# Patient Record
Sex: Male | Born: 1935 | Race: White | Hispanic: No | Marital: Married | Smoking: Former smoker
Health system: Southern US, Community
[De-identification: ages and names within clinical notes are randomized; demographics above are authoritative.]

## PROBLEM LIST (undated history)

## (undated) DIAGNOSIS — F0390 Unspecified dementia without behavioral disturbance: Secondary | ICD-10-CM

## (undated) DIAGNOSIS — I2510 Atherosclerotic heart disease of native coronary artery without angina pectoris: Secondary | ICD-10-CM

## (undated) DIAGNOSIS — R112 Nausea with vomiting, unspecified: Secondary | ICD-10-CM

## (undated) DIAGNOSIS — M1990 Unspecified osteoarthritis, unspecified site: Secondary | ICD-10-CM

## (undated) DIAGNOSIS — Z9889 Other specified postprocedural states: Secondary | ICD-10-CM

## (undated) DIAGNOSIS — I639 Cerebral infarction, unspecified: Secondary | ICD-10-CM

## (undated) DIAGNOSIS — I739 Peripheral vascular disease, unspecified: Secondary | ICD-10-CM

## (undated) DIAGNOSIS — K219 Gastro-esophageal reflux disease without esophagitis: Secondary | ICD-10-CM

## (undated) DIAGNOSIS — I219 Acute myocardial infarction, unspecified: Secondary | ICD-10-CM

## (undated) HISTORY — PX: CATARACT EXTRACTION: SUR2

## (undated) HISTORY — PX: VASCULAR SURGERY: SHX849

## (undated) HISTORY — PX: JOINT REPLACEMENT: SHX530

## (undated) HISTORY — PX: CARDIAC CATHETERIZATION: SHX172

---

## 2005-02-15 ENCOUNTER — Emergency Department: Payer: Self-pay | Admitting: General Practice

## 2005-02-16 ENCOUNTER — Ambulatory Visit: Payer: Self-pay | Admitting: General Practice

## 2005-02-27 ENCOUNTER — Emergency Department: Payer: Self-pay | Admitting: Emergency Medicine

## 2005-04-26 ENCOUNTER — Ambulatory Visit: Payer: Self-pay | Admitting: Ophthalmology

## 2005-06-09 ENCOUNTER — Ambulatory Visit: Payer: Self-pay | Admitting: Internal Medicine

## 2005-06-10 ENCOUNTER — Ambulatory Visit: Payer: Self-pay | Admitting: Internal Medicine

## 2005-06-15 ENCOUNTER — Ambulatory Visit: Payer: Self-pay | Admitting: Internal Medicine

## 2005-06-18 ENCOUNTER — Ambulatory Visit: Payer: Self-pay | Admitting: Internal Medicine

## 2005-07-27 ENCOUNTER — Ambulatory Visit: Payer: Self-pay | Admitting: Internal Medicine

## 2007-01-23 ENCOUNTER — Ambulatory Visit: Payer: Self-pay | Admitting: Internal Medicine

## 2008-11-08 ENCOUNTER — Emergency Department: Payer: Self-pay | Admitting: Emergency Medicine

## 2012-03-20 ENCOUNTER — Ambulatory Visit: Payer: Self-pay | Admitting: General Practice

## 2012-03-20 LAB — CBC
MCH: 31.5 pg (ref 26.0–34.0)
MCHC: 34 g/dL (ref 32.0–36.0)
Platelet: 206 10*3/uL (ref 150–440)
RBC: 3.75 10*6/uL — ABNORMAL LOW (ref 4.40–5.90)
RDW: 13 % (ref 11.5–14.5)

## 2012-03-20 LAB — BASIC METABOLIC PANEL
Anion Gap: 6 — ABNORMAL LOW (ref 7–16)
BUN: 23 mg/dL — ABNORMAL HIGH (ref 7–18)
Creatinine: 1.15 mg/dL (ref 0.60–1.30)
EGFR (African American): >60
Osmolality: 273 (ref 275–301)
Potassium: 4.3 mmol/L (ref 3.5–5.1)

## 2012-03-20 LAB — URINALYSIS, COMPLETE
Glucose,UR: NEGATIVE mg/dL (ref 0–75)
Ketone: NEGATIVE
Specific Gravity: 1.009 (ref 1.003–1.030)
WBC UR: NONE SEEN /HPF (ref 0–5)

## 2012-03-20 LAB — APTT: Activated PTT: 32.4 secs (ref 23.6–35.9)

## 2012-03-20 LAB — MRSA PCR SCREENING

## 2012-04-03 ENCOUNTER — Inpatient Hospital Stay: Payer: Self-pay | Admitting: General Practice

## 2012-04-04 LAB — BASIC METABOLIC PANEL
BUN: 16 mg/dL (ref 7–18)
Co2: 24 mmol/L (ref 21–32)
EGFR (Non-African Amer.): 57 — ABNORMAL LOW
Glucose: 103 mg/dL — ABNORMAL HIGH (ref 65–99)
Osmolality: 273 (ref 275–301)
Potassium: 4.2 mmol/L (ref 3.5–5.1)

## 2012-04-04 LAB — PLATELET COUNT: Platelet: 144 10*3/uL — ABNORMAL LOW (ref 150–440)

## 2012-04-05 LAB — BASIC METABOLIC PANEL
BUN: 12 mg/dL (ref 7–18)
Co2: 22 mmol/L (ref 21–32)
Creatinine: 1.11 mg/dL (ref 0.60–1.30)
EGFR (Non-African Amer.): >60
Glucose: 81 mg/dL (ref 65–99)
Osmolality: 276 (ref 275–301)

## 2012-04-05 LAB — PLATELET COUNT: Platelet: 169 10*3/uL (ref 150–440)

## 2012-04-05 LAB — HEMOGLOBIN: HGB: 10.7 g/dL — ABNORMAL LOW (ref 13.0–18.0)

## 2012-04-11 ENCOUNTER — Observation Stay: Payer: Self-pay | Admitting: Internal Medicine

## 2012-04-11 LAB — BASIC METABOLIC PANEL
Anion Gap: 11 (ref 7–16)
Calcium, Total: 9.1 mg/dL (ref 8.5–10.1)
Chloride: 100 mmol/L (ref 98–107)
Co2: 24 mmol/L (ref 21–32)
EGFR (African American): 58 — ABNORMAL LOW
EGFR (Non-African Amer.): 50 — ABNORMAL LOW
Osmolality: 275 (ref 275–301)
Potassium: 4.2 mmol/L (ref 3.5–5.1)
Sodium: 135 mmol/L — ABNORMAL LOW (ref 136–145)

## 2012-04-11 LAB — URINALYSIS, COMPLETE
Bacteria: NONE SEEN
Bilirubin,UR: NEGATIVE
Blood: NEGATIVE
Glucose,UR: NEGATIVE mg/dL (ref 0–75)
Leukocyte Esterase: NEGATIVE
Nitrite: NEGATIVE
Protein: NEGATIVE
RBC,UR: <1 /HPF (ref 0–5)
WBC UR: <1 /HPF (ref 0–5)

## 2012-04-11 LAB — TROPONIN I
Troponin-I: <0.02 ng/mL
Troponin-I: <0.02 ng/mL

## 2012-04-11 LAB — CBC
HCT: 29.4 % — ABNORMAL LOW (ref 40.0–52.0)
MCH: 31.9 pg (ref 26.0–34.0)
MCV: 92 fL (ref 80–100)
RDW: 13.6 % (ref 11.5–14.5)

## 2012-04-11 LAB — CK TOTAL AND CKMB (NOT AT ARMC)
CK, Total: 165 U/L (ref 35–232)
CK-MB: 1 ng/mL (ref 0.5–3.6)
CK-MB: 1.4 ng/mL (ref 0.5–3.6)

## 2012-04-12 LAB — BASIC METABOLIC PANEL
Anion Gap: 9 (ref 7–16)
Calcium, Total: 8.3 mg/dL — ABNORMAL LOW (ref 8.5–10.1)
Chloride: 103 mmol/L (ref 98–107)
Co2: 24 mmol/L (ref 21–32)
EGFR (African American): >60
EGFR (Non-African Amer.): 59 — ABNORMAL LOW
Glucose: 94 mg/dL (ref 65–99)
Potassium: 4.3 mmol/L (ref 3.5–5.1)
Sodium: 136 mmol/L (ref 136–145)

## 2012-04-12 LAB — TROPONIN I: Troponin-I: <0.02 ng/mL

## 2012-04-12 LAB — CK TOTAL AND CKMB (NOT AT ARMC): CK, Total: 121 U/L (ref 35–232)

## 2012-11-14 ENCOUNTER — Inpatient Hospital Stay (HOSPITAL_COMMUNITY)
Admission: AD | Admit: 2012-11-14 | Discharge: 2012-11-21 | DRG: 236 | Disposition: A | Discharge status: Home / Self Care | Payer: Medicare Other | Source: Other Acute Inpatient Hospital | Attending: Cardiothoracic Surgery | Admitting: Cardiothoracic Surgery

## 2012-11-14 ENCOUNTER — Other Ambulatory Visit: Payer: Self-pay | Admitting: *Deleted

## 2012-11-14 ENCOUNTER — Inpatient Hospital Stay (HOSPITAL_COMMUNITY): Payer: Medicare Other

## 2012-11-14 ENCOUNTER — Encounter (HOSPITAL_COMMUNITY): Payer: Self-pay | Admitting: *Deleted

## 2012-11-14 DIAGNOSIS — K4020 Bilateral inguinal hernia, without obstruction or gangrene, not specified as recurrent: Secondary | ICD-10-CM | POA: Diagnosis present

## 2012-11-14 DIAGNOSIS — K219 Gastro-esophageal reflux disease without esophagitis: Secondary | ICD-10-CM | POA: Diagnosis present

## 2012-11-14 DIAGNOSIS — I2510 Atherosclerotic heart disease of native coronary artery without angina pectoris: Secondary | ICD-10-CM

## 2012-11-14 DIAGNOSIS — Z96659 Presence of unspecified artificial knee joint: Secondary | ICD-10-CM

## 2012-11-14 DIAGNOSIS — Z87891 Personal history of nicotine dependence: Secondary | ICD-10-CM

## 2012-11-14 DIAGNOSIS — Z79899 Other long term (current) drug therapy: Secondary | ICD-10-CM

## 2012-11-14 DIAGNOSIS — Z955 Presence of coronary angioplasty implant and graft: Secondary | ICD-10-CM

## 2012-11-14 DIAGNOSIS — D62 Acute posthemorrhagic anemia: Secondary | ICD-10-CM | POA: Diagnosis present

## 2012-11-14 DIAGNOSIS — Z9889 Other specified postprocedural states: Secondary | ICD-10-CM

## 2012-11-14 DIAGNOSIS — Z951 Presence of aortocoronary bypass graft: Secondary | ICD-10-CM

## 2012-11-14 DIAGNOSIS — E871 Hypo-osmolality and hyponatremia: Secondary | ICD-10-CM | POA: Diagnosis present

## 2012-11-14 DIAGNOSIS — Z7982 Long term (current) use of aspirin: Secondary | ICD-10-CM

## 2012-11-14 DIAGNOSIS — I200 Unstable angina: Secondary | ICD-10-CM | POA: Diagnosis present

## 2012-11-14 DIAGNOSIS — Z8249 Family history of ischemic heart disease and other diseases of the circulatory system: Secondary | ICD-10-CM

## 2012-11-14 DIAGNOSIS — Z9861 Coronary angioplasty status: Secondary | ICD-10-CM

## 2012-11-14 HISTORY — DX: Other specified postprocedural states: Z98.890

## 2012-11-14 HISTORY — DX: Atherosclerotic heart disease of native coronary artery without angina pectoris: I25.10

## 2012-11-14 HISTORY — DX: Peripheral vascular disease, unspecified: I73.9

## 2012-11-14 HISTORY — DX: Other specified postprocedural states: R11.2

## 2012-11-14 HISTORY — DX: Acute myocardial infarction, unspecified: I21.9

## 2012-11-14 HISTORY — DX: Gastro-esophageal reflux disease without esophagitis: K21.9

## 2012-11-14 HISTORY — DX: Unspecified osteoarthritis, unspecified site: M19.90

## 2012-11-14 LAB — CBC
MCH: 31.9 pg (ref 26.0–34.0)
MCHC: 35.7 g/dL (ref 30.0–36.0)
Platelets: 196 10*3/uL (ref 150–400)

## 2012-11-14 LAB — URINALYSIS, ROUTINE W REFLEX MICROSCOPIC
Bilirubin Urine: NEGATIVE
Glucose, UA: NEGATIVE mg/dL
Hgb urine dipstick: NEGATIVE
Ketones, ur: NEGATIVE mg/dL
Leukocytes, UA: NEGATIVE
Protein, ur: NEGATIVE mg/dL
pH: 6.5 (ref 5.0–8.0)

## 2012-11-14 LAB — COMPREHENSIVE METABOLIC PANEL
ALT: 21 U/L (ref 0–53)
AST: 37 U/L (ref 0–37)
CO2: 20 mEq/L (ref 19–32)
Calcium: 9.5 mg/dL (ref 8.4–10.5)
Chloride: 97 mEq/L (ref 96–112)
Creatinine, Ser: 1.37 mg/dL — ABNORMAL HIGH (ref 0.50–1.35)
GFR calc Af Amer: 55 mL/min — ABNORMAL LOW (ref >90)
GFR calc non Af Amer: 48 mL/min — ABNORMAL LOW (ref >90)
Glucose, Bld: 114 mg/dL — ABNORMAL HIGH (ref 70–99)
Sodium: 131 mEq/L — ABNORMAL LOW (ref 135–145)
Total Bilirubin: 0.3 mg/dL (ref 0.3–1.2)

## 2012-11-14 LAB — PROTIME-INR
INR: 1.03 (ref 0.00–1.49)
Prothrombin Time: 13.4 seconds (ref 11.6–15.2)

## 2012-11-14 LAB — SURGICAL PCR SCREEN
MRSA, PCR: NEGATIVE
Staphylococcus aureus: POSITIVE — AB

## 2012-11-14 MED ORDER — SODIUM CHLORIDE 0.9 % IJ SOLN: 3.0000 mL | Freq: Two times a day (BID) | INTRAMUSCULAR | Status: DC

## 2012-11-14 MED ORDER — ACETAMINOPHEN 325 MG PO TABS: 650.0000 mg | ORAL_TABLET | Freq: Four times a day (QID) | ORAL | Status: DC | PRN

## 2012-11-14 MED ORDER — ALUM & MAG HYDROXIDE-SIMETH 200-200-20 MG/5ML PO SUSP: 30.0000 mL | Freq: Four times a day (QID) | ORAL | Status: DC | PRN

## 2012-11-14 MED ORDER — DOCUSATE SODIUM 100 MG PO CAPS
100.0000 mg | ORAL_CAPSULE | Freq: Every day | ORAL | Status: DC
  Administered 2012-11-14 – 2012-11-15 (×2): 100 mg via ORAL
  Filled 2012-11-14 (×3): qty 1

## 2012-11-14 MED ORDER — ALUM & MAG HYDROXIDE-SIMETH 200-200-20 MG/5ML PO SUSP: 30.0000 mL | ORAL | Status: DC | PRN

## 2012-11-14 MED ORDER — SODIUM CHLORIDE 0.9 % IJ SOLN: 3.0000 mL | INTRAMUSCULAR | Status: DC | PRN

## 2012-11-14 MED ORDER — ADULT MULTIVITAMIN W/MINERALS CH
1.0000 | ORAL_TABLET | Freq: Every day | ORAL | Status: DC
  Administered 2012-11-14 – 2012-11-15 (×2): 1 via ORAL
  Filled 2012-11-14 (×3): qty 1

## 2012-11-14 MED ORDER — HYDROCODONE-ACETAMINOPHEN 5-325 MG PO TABS: 1.0000 | ORAL_TABLET | ORAL | Status: DC | PRN

## 2012-11-14 MED ORDER — GUAIFENESIN-DM 100-10 MG/5ML PO SYRP: 15.0000 mL | ORAL_SOLUTION | ORAL | Status: DC | PRN

## 2012-11-14 MED ORDER — ENOXAPARIN SODIUM 40 MG/0.4ML ~~LOC~~ SOLN
40.0000 mg | SUBCUTANEOUS | Status: DC
  Administered 2012-11-14 – 2012-11-15 (×2): 40 mg via SUBCUTANEOUS
  Filled 2012-11-14 (×4): qty 0.4

## 2012-11-14 MED ORDER — METOPROLOL TARTRATE 25 MG PO TABS
25.0000 mg | ORAL_TABLET | Freq: Two times a day (BID) | ORAL | Status: DC
  Administered 2012-11-14 – 2012-11-15 (×3): 25 mg via ORAL
  Filled 2012-11-14 (×5): qty 1

## 2012-11-14 MED ORDER — MAGNESIUM HYDROXIDE 400 MG/5ML PO SUSP: 30.0000 mL | Freq: Every day | ORAL | Status: DC | PRN

## 2012-11-14 MED ORDER — SODIUM CHLORIDE 0.9 % IV SOLN: 250.0000 mL | INTRAVENOUS | Status: DC | PRN

## 2012-11-14 MED ORDER — ATORVASTATIN CALCIUM 20 MG PO TABS
20.0000 mg | ORAL_TABLET | Freq: Every day | ORAL | Status: DC
  Administered 2012-11-14 – 2012-11-15 (×2): 20 mg via ORAL
  Filled 2012-11-14 (×5): qty 1

## 2012-11-14 MED ORDER — PANTOPRAZOLE SODIUM 40 MG PO TBEC
40.0000 mg | DELAYED_RELEASE_TABLET | Freq: Every day | ORAL | Status: DC
  Administered 2012-11-14 – 2012-11-15 (×2): 40 mg via ORAL
  Filled 2012-11-14 (×2): qty 1

## 2012-11-14 MED ORDER — SODIUM CHLORIDE 0.9 % IJ SOLN
3.0000 mL | Freq: Two times a day (BID) | INTRAMUSCULAR | Status: DC
  Administered 2012-11-14 – 2012-11-15 (×2): 3 mL via INTRAVENOUS

## 2012-11-14 MED ORDER — ACETAMINOPHEN 650 MG RE SUPP: 650.0000 mg | Freq: Four times a day (QID) | RECTAL | Status: DC | PRN

## 2012-11-14 MED ORDER — ONDANSETRON HCL 4 MG PO TABS: 4.0000 mg | ORAL_TABLET | Freq: Four times a day (QID) | ORAL | Status: DC | PRN

## 2012-11-14 MED ORDER — ASPIRIN 325 MG PO TABS
325.0000 mg | ORAL_TABLET | Freq: Every day | ORAL | Status: DC
  Administered 2012-11-15: 325 mg via ORAL
  Filled 2012-11-14 (×2): qty 1

## 2012-11-14 MED ORDER — ONDANSETRON HCL 4 MG/2ML IJ SOLN: 4.0000 mg | Freq: Four times a day (QID) | INTRAMUSCULAR | Status: DC | PRN

## 2012-11-14 NOTE — Consult Note (Signed)
[address]            [place] [address]          [phone]       ABSHIR PAOLINI [facility] Medical Record #[id number] Date of Birth: 1932-07-03  Referring: No ref. provider found Primary Care: Virgel Manifold, MD  Chief Complaint:   No chief complaint on file.  chest pain epigastric discomfort  History of Present Illness:     78 year old Caucasian male nondiabetic nonsmoker transferred from outside hospital for surgical coronary revascularization. Patient has history of CAD and has had coronary stents placed in 2001 in 2006 at outside hospitals. Patient also has had left carotid endarterectomy performed at [facility] 5 years ago. Patient has strong family history of CAD and myocardial infarction. Patient had sudden onset epigastric discomfort after eating Saturday night which persisted and associated with diaphoresis and some shortness of breath. He was taken to a local emergency department and then transferred to [facility] where he was studied with cardiac catheterization  yesterday. This shows severe three-vessel coronary disease with chronic occlusion RCA and high-grade stenosis of the OM1 and LAD, ramus. EF appears to be well preserved and 2-D echo is pending. Patient has had no further symptoms of unstable angina after being admitted to the hospital placed on beta blocker and fractionated heparin   Current Activity/ Functional Status: Was with wife fairly active   Past Medical History  Diagnosis Date  . PONV (postoperative nausea and vomiting)   . Coronary artery disease     2 stents  . Myocardial infarction     NSTEMI 11/11/12 @ [facility]  . Peripheral vascular disease     (L)CEA 2007 [place]  . GERD (gastroesophageal reflux disease)   . Arthritis     (R)TKR 5/13 [facility]    Past Surgical History  Procedure Date  . Vascular surgery     (R)TKR 5/13 [facility]  . Cardiac catheterization     11/11/12 [facility],2000,2006 w/ stents    . Joint replacement     (R)TKR 5/13 [facility]    History  Smoking status  . Former Smoker  . Types: Cigars  . Quit date: 10/22/1989  Smokeless tobacco  . Never Used    History  Alcohol Use No    History   Social History  . Marital Status: Married    Spouse Name: N/A    Number of Children: N/A  . Years of Education: N/A   Occupational History  . Not on file.   Social History Main Topics  . Smoking status: Former Smoker    Types: Cigars    Quit date: 10/22/1989  . Smokeless tobacco: Never Used  . Alcohol Use: No  . Drug Use: No  . Sexually Active:    Other Topics Concern  . Not on file   Social History Narrative  . No narrative on file    Allergies  Allergen Reactions  . Tramadol Hcl Other (See Comments)    Hallucinations    Current Facility-Administered Medications  Medication Dose Route Frequency Provider Last Rate Last Dose  . acetaminophen (TYLENOL) tablet 650 mg  650 mg Oral Q6H PRN Ardelle Balls, PA       Or  . acetaminophen (TYLENOL) suppository 650 mg  650 mg Rectal Q6H PRN Ardelle Balls, PA      . alum & Jodelle Green  hydroxide-simeth (MAALOX/MYLANTA) 200-200-20 MG/5ML suspension 30 mL  30 mL Oral Q6H PRN Ardelle Balls, PA      . alum & mag hydroxide-simeth (MAALOX/MYLANTA) 200-200-20 MG/5ML suspension 30 mL  30 mL Oral Q2H PRN Kerin Perna, MD      . aspirin tablet 325 mg  325 mg Oral Daily Donielle Margaretann Loveless, PA      . atorvastatin (LIPITOR) tablet 20 mg  20 mg Oral q1800 Kerin Perna, MD      . docusate sodium (COLACE) capsule 100 mg  100 mg Oral Daily Donielle Margaretann Loveless, PA      . enoxaparin (LOVENOX) injection 40 mg  40 mg Subcutaneous Q24H Kerin Perna, MD      . guaiFENesin-dextromethorphan [facility] DM) 100-10 MG/5ML syrup 15 mL  15 mL Oral Q4H PRN Kerin Perna, MD      . HYDROcodone-acetaminophen (NORCO/VICODIN) 5-325 MG per tablet 1 tablet  1 tablet Oral Q4H PRN Ardelle Balls, PA      . magnesium hydroxide  (MILK OF MAGNESIA) suspension 30 mL  30 mL Oral Daily PRN Kerin Perna, MD      . metoprolol tartrate (LOPRESSOR) tablet 25 mg  25 mg Oral BID Kerin Perna, MD      . multivitamin with minerals tablet 1 tablet  1 tablet Oral Daily Donielle Margaretann Loveless, PA      . ondansetron (ZOFRAN) tablet 4 mg  4 mg Oral Q6H PRN Ardelle Balls, PA       Or  . ondansetron (ZOFRAN) injection 4 mg  4 mg Intravenous Q6H PRN Ardelle Balls, PA      . pantoprazole (PROTONIX) EC tablet 40 mg  40 mg Oral Q1200 Kerin Perna, MD      . sodium chloride 0.9 % injection 3 mL  3 mL Intravenous PRN Ardelle Balls, PA      . sodium chloride 0.9 % injection 3 mL  3 mL Intravenous Q12H Ardelle Balls, PA        Prescriptions prior to admission  Medication Sig Dispense Refill  . aspirin 325 MG tablet Take 325 mg by mouth every 6 (six) hours as needed. For pain      . cholecalciferol (VITAMIN D) 1000 UNITS tablet Take 1,000 Units by mouth daily.      . Multiple Vitamin (MULTIVITAMIN WITH MINERALS) TABS Take 1 tablet by mouth daily.      . vitamin C (ASCORBIC ACID) 500 MG tablet Take 500 mg by mouth daily.        History reviewed. No pertinent family history.   Review of Systems:     Cardiac Review of Systems: Y or N  Chest Pain [  Y.  ]  Resting SOB [N.   ] Exertional SOB  [Y.  ]  Orthopnea [N.  ]   Pedal Edema [ N.  ]    Palpitations [Y.  ] Syncope  [N.  ]   Presyncope [N.   ]  General Review of Systems: [Y] = yes [  ]=no Constitional: recent weight change [  ]; anorexia [  ]; fatigue [  ]; nausea [  ]; night sweats [  ]; fever [  ]; or chills [  ];  Dental: poor dentition[  ]; Last Dentist visit-dental plates  Eye : blurred vision [  ]; diplopia [   ]; vision changes [  ];  Amaurosis fugax[  ]; Resp: cough [  ];  wheezing[  ];  hemoptysis[  ]; shortness  of breath[  ]; paroxysmal nocturnal dyspnea[  ]; dyspnea on exertion[  ]; or orthopnea[  ];  GI:  gallstones[  ], vomiting[  ];  dysphagia[  ]; melena[  ];  hematochezia [  ]; heartburn[  ];   Hx of  Colonoscopy[  ]; GU: kidney stones [  ]; hematuria[  ];   dysuria [  ];  nocturia[  ];  history of     obstruction [  ];             Skin: rash, swelling[  ];, hair loss[  ];  peripheral edema[  ];  or itching[  ]; Musculosketetal: myalgias[  ];  joint swelling[  ];  joint erythema[  ];  joint pain[  ];  back pain[  ];  Heme/Lymph: bruising[  ];  bleeding[  ];  anemia[  ];  Neuro: TIA[  ];  headaches[  ];  stroke[  ];  vertigo[  ];  seizures[  ];   paresthesias[  ];  difficulty walking[  ];  Psych:depression[  ]; anxiety[  ];  Endocrine: diabetes[ N. ];  thyroid dysfunction[  ];  Immunizations: Flu [  ]; Pneumococcal[  ];  Other: Right-hand dominant, mild varicose veins right greater than left  Physical Exam: BP 128/62  Pulse 61  Temp 99 F (37.2 C) (Oral)  Resp 18  Ht 5\' 8"  (1.727 m)  Wt 192 lb 0.3 oz (87.1 kg)  BMI 29.20 kg/m2  SpO2 98%  General appearance pleasant short obese Caucasian male in the CCU no distress HEENT normocephalic pupils equal Neck without JVD mass or bruit, left neck carotid surgical incision Thorax without deformity breath sounds clear Cardiac rhythm regular murmur or gallop Abdomen obese without pulsatile mass or tenderness Extremities warm positive pulses positive for varicose veins both lower legs, mild hematoma right groin from cardiac catheterization Neurologic alert and oriented without focal motor deficit Abdomen soft obese no palpable pulsatile mass   Diagnostic Studies & Laboratory data:     Recent Radiology Findings:   Portable Chest 1 View  11/14/2012  *RADIOLOGY REPORT*  Clinical Data: Preop for CABG  PORTABLE CHEST - 1 VIEW  Comparison: None.  Findings: The lungs are clear.  Mediastinal contours appear normal. The heart is within normal limits  in size.  There are degenerative changes throughout the thoracic spine.  IMPRESSION: No active lung disease.   Original Report Authenticated By: Dwyane Dee, M.D.       Recent Lab Findings: Lab Results  Component Value Date   WBC 8.3 11/14/2012   HGB 12.3* 11/14/2012   HCT 34.5* 11/14/2012   PLT 196 11/14/2012   INR 1.03 11/14/2012      Assessment / Plan:      Unstable angina Three-vessel coronary disease with preserved LV function Prior history of left carotid endarterectomy Mild varicose veins bilaterally Plan multivessel bypass grafting December 26, procedure discussed with patient      @me1 @ 11/14/2012 7:39 PM

## 2012-11-15 ENCOUNTER — Inpatient Hospital Stay (HOSPITAL_COMMUNITY): Payer: Medicare Other

## 2012-11-15 DIAGNOSIS — I2510 Atherosclerotic heart disease of native coronary artery without angina pectoris: Secondary | ICD-10-CM

## 2012-11-15 DIAGNOSIS — Z01810 Encounter for preprocedural cardiovascular examination: Secondary | ICD-10-CM

## 2012-11-15 LAB — POCT I-STAT 3, ART BLOOD GAS (G3+)
Acid-base deficit: 2 mmol/L (ref 0.0–2.0)
Bicarbonate: 21.3 mEq/L (ref 20.0–24.0)
O2 Saturation: 97 %
TCO2: 22 mmol/L (ref 0–100)
pCO2 arterial: 31.5 mmHg — ABNORMAL LOW (ref 35.0–45.0)
pH, Arterial: 7.438 (ref 7.350–7.450)
pO2, Arterial: 87 mmHg (ref 80.0–100.0)

## 2012-11-15 LAB — PREPARE RBC (CROSSMATCH)

## 2012-11-15 LAB — ABO/RH: ABO/RH(D): A POS

## 2012-11-15 LAB — TSH: TSH: 2.831 u[IU]/mL (ref 0.350–4.500)

## 2012-11-15 MED ORDER — PLASMA-LYTE 148 IV SOLN
INTRAVENOUS | Status: AC
  Administered 2012-11-16: 09:00:00
  Filled 2012-11-15: qty 2.5

## 2012-11-15 MED ORDER — DEXTROSE 5 % IV SOLN
1.5000 g | INTRAVENOUS | Status: AC
  Administered 2012-11-16: .75 g via INTRAVENOUS
  Administered 2012-11-16: 1.5 g via INTRAVENOUS
  Filled 2012-11-15 (×2): qty 1.5

## 2012-11-15 MED ORDER — CHLORHEXIDINE GLUCONATE 4 % EX LIQD
60.0000 mL | Freq: Once | CUTANEOUS | Status: AC
  Administered 2012-11-16: 4 via TOPICAL
  Filled 2012-11-15: qty 60

## 2012-11-15 MED ORDER — MAGNESIUM SULFATE 50 % IJ SOLN
40.0000 meq | INTRAMUSCULAR | Status: DC
  Filled 2012-11-15: qty 10

## 2012-11-15 MED ORDER — EPINEPHRINE HCL 1 MG/ML IJ SOLN
0.5000 ug/min | INTRAMUSCULAR | Status: DC
  Filled 2012-11-15: qty 4

## 2012-11-15 MED ORDER — VANCOMYCIN HCL 10 G IV SOLR
1250.0000 mg | INTRAVENOUS | Status: AC
  Administered 2012-11-16: 1250 mg via INTRAVENOUS
  Filled 2012-11-15 (×2): qty 1250

## 2012-11-15 MED ORDER — POTASSIUM CHLORIDE 2 MEQ/ML IV SOLN
80.0000 meq | INTRAVENOUS | Status: DC
  Filled 2012-11-15: qty 40

## 2012-11-15 MED ORDER — DEXTROSE 5 % IV SOLN
750.0000 mg | INTRAVENOUS | Status: DC
  Filled 2012-11-15: qty 750

## 2012-11-15 MED ORDER — TEMAZEPAM 15 MG PO CAPS: 15.0000 mg | ORAL_CAPSULE | Freq: Once | ORAL | Status: AC | PRN

## 2012-11-15 MED ORDER — DEXTROSE 5 % IV SOLN
30.0000 ug/min | INTRAVENOUS | Status: AC
  Administered 2012-11-16: 15 ug/min via INTRAVENOUS
  Filled 2012-11-15: qty 2

## 2012-11-15 MED ORDER — SODIUM CHLORIDE 0.9 % IV SOLN
INTRAVENOUS | Status: AC
  Administered 2012-11-16: 69.8 mL/h via INTRAVENOUS
  Filled 2012-11-15: qty 40

## 2012-11-15 MED ORDER — DEXMEDETOMIDINE HCL IN NACL 400 MCG/100ML IV SOLN
0.1000 ug/kg/h | INTRAVENOUS | Status: AC
  Administered 2012-11-16: 0.2 ug/kg/h via INTRAVENOUS
  Filled 2012-11-15: qty 100

## 2012-11-15 MED ORDER — BISACODYL 5 MG PO TBEC
5.0000 mg | DELAYED_RELEASE_TABLET | Freq: Once | ORAL | Status: AC
  Administered 2012-11-15: 5 mg via ORAL
  Filled 2012-11-15: qty 1

## 2012-11-15 MED ORDER — ALPRAZOLAM 0.25 MG PO TABS: 0.2500 mg | ORAL_TABLET | ORAL | Status: DC | PRN

## 2012-11-15 MED ORDER — NITROGLYCERIN IN D5W 200-5 MCG/ML-% IV SOLN
2.0000 ug/min | INTRAVENOUS | Status: AC
  Administered 2012-11-16: 5 ug/min via INTRAVENOUS
  Filled 2012-11-15: qty 250

## 2012-11-15 MED ORDER — METOPROLOL TARTRATE 12.5 MG HALF TABLET
12.5000 mg | ORAL_TABLET | Freq: Once | ORAL | Status: AC
  Administered 2012-11-16: 12.5 mg via ORAL
  Filled 2012-11-15: qty 1

## 2012-11-15 MED ORDER — DOPAMINE-DEXTROSE 3.2-5 MG/ML-% IV SOLN
2.0000 ug/kg/min | INTRAVENOUS | Status: AC
  Administered 2012-11-16: 3 ug/kg/min via INTRAVENOUS
  Filled 2012-11-15: qty 250

## 2012-11-15 MED ORDER — SODIUM CHLORIDE 0.9 % IV SOLN
INTRAVENOUS | Status: AC
  Administered 2012-11-16: 1 [IU]/h via INTRAVENOUS
  Filled 2012-11-15: qty 1

## 2012-11-15 NOTE — Progress Notes (Signed)
  Echocardiogram 2D Echocardiogram has been performed.  Mylin Gignac 11/15/2012, 12:20 PM 

## 2012-11-15 NOTE — Progress Notes (Signed)
  Subjective: Unstable angina, three-vessel CAD, preserved LV function Mild varicosities  right greater than left  History left carotid endarterectomy Objective: Vital signs in last 24 hours: Temp:  [97.5 F (36.4 C)-99 F (37.2 C)] 97.5 F (36.4 C) (12/25 0832) Pulse Rate:  [55-62] 55  (12/25 0832) Cardiac Rhythm:  [-] Normal sinus rhythm (12/25 0859) Resp:  [14-18] 18  (12/24 2000) BP: (98-137)/(44-75) 107/56 mmHg (12/25 0813) SpO2:  [94 %-99 %] 94 % (12/25 0832) Weight:  [188 lb 7.9 oz (85.5 kg)-192 lb 0.3 oz (87.1 kg)] 188 lb 7.9 oz (85.5 kg) (12/25 0600)  Hemodynamic parameters for last 24 hours:   normal sinus rhythm  Intake/Output from previous day: 12/24 0701 - 12/25 0700 In: 300 [P.O.:300] Out: 450 [Urine:450] Intake/Output this shift: Total I/O In: -  Out: 400 [Urine:400]  Exam Alert and comfortable Lungs clear Extremities warm Minimal right groin hematoma  Lab Results:  [facility] 11/14/12 1843  WBC 8.3  HGB 12.3*  HCT 34.5*  PLT 196   BMET:  Basename 11/14/12 1843  NA 131*  K 4.4  CL 97  CO2 20  GLUCOSE 114*  BUN 17  CREATININE 1.37*  CALCIUM 9.5    PT/INR:  Basename 11/14/12 1843  LABPROT 13.4  INR 1.03   ABG No results found for this basename: phart, pco2, po2, hco3, tco2, acidbasedef, o2sat   CBG (last 3)  No results found for this basename: GLUCAP:3 in the last 72 hours  Assessment/Plan: S/P   Plan multivessel bypass grafting in a.m. 12-26 for severe three-vessel CAD and unstable angina, history of prior PCI to the LAD and RCA.Brilinta washout preop, P2 Y. 12 assay pending.   LOS: 1 day    VAN TRIGT III,PETER 11/15/2012

## 2012-11-15 NOTE — Progress Notes (Signed)
Pre-op Cardiac Surgery  Carotid Findings:  There is no obvious evidence of hemodynamically significant internal carotid artery stenosis >40% bilaterally. Vertebral arteries are patent with antegrade flow.  Limb dopplers not completed yet.  Upper Extremity Right Left  Brachial Pressures 128-Biphasic 125-Biphasic  Radial Waveforms Biphasic Biphasic  Ulnar Waveforms Monophasic Biphasic  Palmar Arch (Allen's Test) Signal obliterates with radial compression, is unaffected with ulnar compression. Within normal limits.    Lower  Extremity Right Left  Dorsalis Pedis    Anterior Tibial    Posterior Tibial    Ankle/Brachial Indices      Findings:  Palpable pedal pulses.   11/15/2012 2:56 PM Gertie Fey, RDMS, RDCS

## 2012-11-16 ENCOUNTER — Inpatient Hospital Stay (HOSPITAL_COMMUNITY): Payer: Medicare Other

## 2012-11-16 ENCOUNTER — Inpatient Hospital Stay: Admit: 2012-11-16 | Payer: Self-pay | Admitting: Cardiothoracic Surgery

## 2012-11-16 ENCOUNTER — Encounter (HOSPITAL_COMMUNITY): Payer: Self-pay | Admitting: Anesthesiology

## 2012-11-16 ENCOUNTER — Inpatient Hospital Stay (HOSPITAL_COMMUNITY): Payer: Medicare Other | Admitting: Anesthesiology

## 2012-11-16 ENCOUNTER — Encounter (HOSPITAL_COMMUNITY): Payer: Self-pay

## 2012-11-16 ENCOUNTER — Encounter (HOSPITAL_COMMUNITY)
Admission: AD | Disposition: A | Discharge status: Home / Self Care | Payer: Self-pay | Source: Other Acute Inpatient Hospital | Attending: Cardiothoracic Surgery

## 2012-11-16 HISTORY — PX: INTRAOPERATIVE TRANSESOPHAGEAL ECHOCARDIOGRAM: SHX5062

## 2012-11-16 HISTORY — PX: CORONARY ARTERY BYPASS GRAFT: SHX141

## 2012-11-16 LAB — CBC
HCT: 30.5 % — ABNORMAL LOW (ref 39.0–52.0)
HCT: 21.2 % — ABNORMAL LOW (ref 39.0–52.0)
Hemoglobin: 7.7 g/dL — ABNORMAL LOW (ref 13.0–17.0)
MCH: 31.1 pg (ref 26.0–34.0)
MCH: 31 pg (ref 26.0–34.0)
MCHC: 36.3 g/dL — ABNORMAL HIGH (ref 30.0–36.0)
MCV: 86.2 fL (ref 78.0–100.0)
MCV: 85.5 fL (ref 78.0–100.0)
Platelets: 110 10*3/uL — ABNORMAL LOW (ref 150–400)
RBC: 3.54 MIL/uL — ABNORMAL LOW (ref 4.22–5.81)
RBC: 2.48 MIL/uL — ABNORMAL LOW (ref 4.22–5.81)
RDW: 13.6 % (ref 11.5–15.5)
WBC: 9 10*3/uL (ref 4.0–10.5)
WBC: 5 10*3/uL (ref 4.0–10.5)

## 2012-11-16 LAB — POCT I-STAT 3, ART BLOOD GAS (G3+)
Acid-base deficit: 4 mmol/L — ABNORMAL HIGH (ref 0.0–2.0)
Acid-base deficit: 5 mmol/L — ABNORMAL HIGH (ref 0.0–2.0)
Acid-base deficit: 4 mmol/L — ABNORMAL HIGH (ref 0.0–2.0)
Acid-base deficit: 2 mmol/L (ref 0.0–2.0)
Acid-base deficit: 3 mmol/L — ABNORMAL HIGH (ref 0.0–2.0)
Bicarbonate: 19.6 mEq/L — ABNORMAL LOW (ref 20.0–24.0)
Bicarbonate: 23.1 mEq/L (ref 20.0–24.0)
Bicarbonate: 21 mEq/L (ref 20.0–24.0)
Bicarbonate: 21.6 mEq/L (ref 20.0–24.0)
Bicarbonate: 22.4 mEq/L (ref 20.0–24.0)
O2 Saturation: 100 %
O2 Saturation: 86 %
O2 Saturation: 95 %
O2 Saturation: 97 %
O2 Saturation: 98 %
Patient temperature: 35.9
Patient temperature: 37
Patient temperature: 37
TCO2: 21 mmol/L (ref 0–100)
TCO2: 25 mmol/L (ref 0–100)
TCO2: 22 mmol/L (ref 0–100)
TCO2: 23 mmol/L (ref 0–100)
TCO2: 23 mmol/L (ref 0–100)
pCO2 arterial: 33.2 mmHg — ABNORMAL LOW (ref 35.0–45.0)
pCO2 arterial: 52.1 mmHg — ABNORMAL HIGH (ref 35.0–45.0)
pCO2 arterial: 36.2 mmHg (ref 35.0–45.0)
pCO2 arterial: 35 mmHg (ref 35.0–45.0)
pCO2 arterial: 35 mmHg (ref 35.0–45.0)
pH, Arterial: 7.255 — ABNORMAL LOW (ref 7.350–7.450)
pH, Arterial: 7.379 (ref 7.350–7.450)
pH, Arterial: 7.367 (ref 7.350–7.450)
pH, Arterial: 7.399 (ref 7.350–7.450)
pH, Arterial: 7.414 (ref 7.350–7.450)
pO2, Arterial: 286 mmHg — ABNORMAL HIGH (ref 80.0–100.0)
pO2, Arterial: 52 mmHg — ABNORMAL LOW (ref 80.0–100.0)
pO2, Arterial: 75 mmHg — ABNORMAL LOW (ref 80.0–100.0)
pO2, Arterial: 105 mmHg — ABNORMAL HIGH (ref 80.0–100.0)
pO2, Arterial: 92 mmHg (ref 80.0–100.0)

## 2012-11-16 LAB — GLUCOSE, CAPILLARY
Glucose-Capillary: 131 mg/dL — ABNORMAL HIGH (ref 70–99)
Glucose-Capillary: 155 mg/dL — ABNORMAL HIGH (ref 70–99)
Glucose-Capillary: 114 mg/dL — ABNORMAL HIGH (ref 70–99)
Glucose-Capillary: 117 mg/dL — ABNORMAL HIGH (ref 70–99)
Glucose-Capillary: 109 mg/dL — ABNORMAL HIGH (ref 70–99)
Glucose-Capillary: 112 mg/dL — ABNORMAL HIGH (ref 70–99)
Glucose-Capillary: 149 mg/dL — ABNORMAL HIGH (ref 70–99)
Glucose-Capillary: 141 mg/dL — ABNORMAL HIGH (ref 70–99)

## 2012-11-16 LAB — POCT I-STAT 4, (NA,K, GLUC, HGB,HCT)
Glucose, Bld: 107 mg/dL — ABNORMAL HIGH (ref 70–99)
Glucose, Bld: 89 mg/dL (ref 70–99)
Glucose, Bld: 99 mg/dL (ref 70–99)
Glucose, Bld: 126 mg/dL — ABNORMAL HIGH (ref 70–99)
Glucose, Bld: 134 mg/dL — ABNORMAL HIGH (ref 70–99)
Glucose, Bld: 139 mg/dL — ABNORMAL HIGH (ref 70–99)
HCT: 30 % — ABNORMAL LOW (ref 39.0–52.0)
HCT: 22 % — ABNORMAL LOW (ref 39.0–52.0)
HCT: 29 % — ABNORMAL LOW (ref 39.0–52.0)
HCT: 24 % — ABNORMAL LOW (ref 39.0–52.0)
HCT: 27 % — ABNORMAL LOW (ref 39.0–52.0)
HCT: 31 % — ABNORMAL LOW (ref 39.0–52.0)
Hemoglobin: 10.2 g/dL — ABNORMAL LOW (ref 13.0–17.0)
Hemoglobin: 7.5 g/dL — ABNORMAL LOW (ref 13.0–17.0)
Hemoglobin: 9.9 g/dL — ABNORMAL LOW (ref 13.0–17.0)
Hemoglobin: 8.2 g/dL — ABNORMAL LOW (ref 13.0–17.0)
Hemoglobin: 9.2 g/dL — ABNORMAL LOW (ref 13.0–17.0)
Hemoglobin: 10.5 g/dL — ABNORMAL LOW (ref 13.0–17.0)
Potassium: 4.4 mEq/L (ref 3.5–5.1)
Potassium: 4.2 mEq/L (ref 3.5–5.1)
Potassium: 4.5 mEq/L (ref 3.5–5.1)
Potassium: 5.1 mEq/L (ref 3.5–5.1)
Potassium: 5.2 mEq/L — ABNORMAL HIGH (ref 3.5–5.1)
Potassium: 4.2 mEq/L (ref 3.5–5.1)
Sodium: 130 mEq/L — ABNORMAL LOW (ref 135–145)
Sodium: 130 mEq/L — ABNORMAL LOW (ref 135–145)
Sodium: 131 mEq/L — ABNORMAL LOW (ref 135–145)
Sodium: 128 mEq/L — ABNORMAL LOW (ref 135–145)
Sodium: 129 mEq/L — ABNORMAL LOW (ref 135–145)
Sodium: 132 mEq/L — ABNORMAL LOW (ref 135–145)

## 2012-11-16 LAB — POCT I-STAT, CHEM 8
BUN: 18 mg/dL (ref 6–23)
Calcium, Ion: 1.14 mmol/L (ref 1.13–1.30)
Chloride: 102 mEq/L (ref 96–112)
Creatinine, Ser: 1 mg/dL (ref 0.50–1.35)
Glucose, Bld: 110 mg/dL — ABNORMAL HIGH (ref 70–99)
HCT: 18 % — ABNORMAL LOW (ref 39.0–52.0)
Hemoglobin: 6.1 g/dL — CL (ref 13.0–17.0)
Potassium: 4.3 mEq/L (ref 3.5–5.1)
Sodium: 134 mEq/L — ABNORMAL LOW (ref 135–145)
TCO2: 22 mmol/L (ref 0–100)

## 2012-11-16 LAB — PULMONARY FUNCTION TEST

## 2012-11-16 LAB — CREATININE, SERUM
Creatinine, Ser: 1.02 mg/dL (ref 0.50–1.35)
GFR calc Af Amer: 79 mL/min — ABNORMAL LOW (ref >90)
GFR calc non Af Amer: 68 mL/min — ABNORMAL LOW (ref >90)

## 2012-11-16 LAB — PLATELET COUNT: Platelets: 108 10*3/uL — ABNORMAL LOW (ref 150–400)

## 2012-11-16 LAB — HEMOGLOBIN AND HEMATOCRIT, BLOOD
HCT: 18.6 % — ABNORMAL LOW (ref 39.0–52.0)
Hemoglobin: 6.8 g/dL — CL (ref 13.0–17.0)

## 2012-11-16 LAB — MAGNESIUM: Magnesium: 3 mg/dL — ABNORMAL HIGH (ref 1.5–2.5)

## 2012-11-16 LAB — APTT: aPTT: 45 seconds — ABNORMAL HIGH (ref 24–37)

## 2012-11-16 SURGERY — CORONARY ARTERY BYPASS GRAFTING (CABG)
Anesthesia: General | Site: Chest | Wound class: Clean

## 2012-11-16 MED ORDER — MIDAZOLAM HCL 5 MG/5ML IJ SOLN
INTRAMUSCULAR | Status: DC | PRN
  Administered 2012-11-16: 2 mg via INTRAVENOUS
  Administered 2012-11-16: 4 mg via INTRAVENOUS
  Administered 2012-11-16: 2 mg via INTRAVENOUS

## 2012-11-16 MED ORDER — SODIUM CHLORIDE 0.9 % IJ SOLN
OROMUCOSAL | Status: DC | PRN
  Administered 2012-11-16 (×3): via TOPICAL

## 2012-11-16 MED ORDER — DEXTROSE 5 % IV SOLN
1.5000 g | Freq: Two times a day (BID) | INTRAVENOUS | Status: AC
  Administered 2012-11-16 – 2012-11-18 (×4): 1.5 g via INTRAVENOUS
  Filled 2012-11-16 (×4): qty 1.5

## 2012-11-16 MED ORDER — ACETAMINOPHEN 160 MG/5ML PO SOLN
975.0000 mg | Freq: Four times a day (QID) | ORAL | Status: DC
  Filled 2012-11-16: qty 40.6

## 2012-11-16 MED ORDER — METOPROLOL TARTRATE 25 MG/10 ML ORAL SUSPENSION
12.5000 mg | Freq: Two times a day (BID) | ORAL | Status: DC
  Administered 2012-11-20: 12.5 mg
  Filled 2012-11-16 (×11): qty 5

## 2012-11-16 MED ORDER — LACTATED RINGERS IV SOLN: 500.0000 mL | Freq: Once | INTRAVENOUS | Status: AC | PRN

## 2012-11-16 MED ORDER — HEMOSTATIC AGENTS (NO CHARGE) OPTIME
TOPICAL | Status: DC | PRN
  Administered 2012-11-16 (×2): 1 via TOPICAL

## 2012-11-16 MED ORDER — ACETAMINOPHEN 10 MG/ML IV SOLN
1000.0000 mg | Freq: Once | INTRAVENOUS | Status: AC
  Administered 2012-11-16: 1000 mg via INTRAVENOUS
  Filled 2012-11-16: qty 100

## 2012-11-16 MED ORDER — DOCUSATE SODIUM 100 MG PO CAPS
200.0000 mg | ORAL_CAPSULE | Freq: Every day | ORAL | Status: DC
  Administered 2012-11-17 – 2012-11-21 (×5): 200 mg via ORAL
  Filled 2012-11-16 (×5): qty 2

## 2012-11-16 MED ORDER — POTASSIUM CHLORIDE 10 MEQ/50ML IV SOLN: 10.0000 meq | INTRAVENOUS | Status: AC

## 2012-11-16 MED ORDER — PANTOPRAZOLE SODIUM 40 MG PO TBEC
40.0000 mg | DELAYED_RELEASE_TABLET | Freq: Every day | ORAL | Status: DC
  Administered 2012-11-18 – 2012-11-21 (×4): 40 mg via ORAL
  Filled 2012-11-16 (×4): qty 1

## 2012-11-16 MED ORDER — SODIUM CHLORIDE 0.9 % IV SOLN: INTRAVENOUS | Status: DC

## 2012-11-16 MED ORDER — BISACODYL 10 MG RE SUPP: 10.0000 mg | Freq: Every day | RECTAL | Status: DC

## 2012-11-16 MED ORDER — DEXMEDETOMIDINE HCL IN NACL 200 MCG/50ML IV SOLN
0.1000 ug/kg/h | INTRAVENOUS | Status: DC
  Administered 2012-11-16 (×2): 0.7 ug/kg/h via INTRAVENOUS
  Filled 2012-11-16 (×3): qty 50

## 2012-11-16 MED ORDER — SODIUM CHLORIDE 0.9 % IV SOLN
INTRAVENOUS | Status: DC | PRN
  Administered 2012-11-16: 13:00:00 via INTRAVENOUS

## 2012-11-16 MED ORDER — MORPHINE SULFATE 2 MG/ML IJ SOLN: 1.0000 mg | INTRAMUSCULAR | Status: AC | PRN

## 2012-11-16 MED ORDER — ACETAMINOPHEN 500 MG PO TABS
1000.0000 mg | ORAL_TABLET | Freq: Four times a day (QID) | ORAL | Status: DC
  Administered 2012-11-16 – 2012-11-21 (×13): 1000 mg via ORAL
  Filled 2012-11-16 (×20): qty 2

## 2012-11-16 MED ORDER — BUDESONIDE-FORMOTEROL FUMARATE 160-4.5 MCG/ACT IN AERO
2.0000 | INHALATION_SPRAY | Freq: Two times a day (BID) | RESPIRATORY_TRACT | Status: DC
  Administered 2012-11-16 – 2012-11-21 (×10): 2 via RESPIRATORY_TRACT
  Filled 2012-11-16: qty 6

## 2012-11-16 MED ORDER — ONDANSETRON HCL 4 MG/2ML IJ SOLN
4.0000 mg | Freq: Four times a day (QID) | INTRAMUSCULAR | Status: DC | PRN
  Administered 2012-11-17 – 2012-11-18 (×2): 4 mg via INTRAVENOUS
  Filled 2012-11-16 (×2): qty 2

## 2012-11-16 MED ORDER — INSULIN REGULAR BOLUS VIA INFUSION
0.0000 [IU] | Freq: Three times a day (TID) | INTRAVENOUS | Status: AC
  Filled 2012-11-16: qty 10

## 2012-11-16 MED ORDER — OXYCODONE HCL 5 MG PO TABS
5.0000 mg | ORAL_TABLET | ORAL | Status: DC | PRN
  Administered 2012-11-17: 5 mg via ORAL
  Filled 2012-11-16: qty 1

## 2012-11-16 MED ORDER — NITROGLYCERIN IN D5W 200-5 MCG/ML-% IV SOLN
0.0000 ug/min | INTRAVENOUS | Status: DC
  Administered 2012-11-16: 5 ug/min via INTRAVENOUS

## 2012-11-16 MED ORDER — LACTATED RINGERS IV SOLN
INTRAVENOUS | Status: DC | PRN
  Administered 2012-11-16: 07:00:00 via INTRAVENOUS

## 2012-11-16 MED ORDER — FAMOTIDINE IN NACL 20-0.9 MG/50ML-% IV SOLN
20.0000 mg | Freq: Two times a day (BID) | INTRAVENOUS | Status: DC
  Administered 2012-11-16: 20 mg via INTRAVENOUS

## 2012-11-16 MED ORDER — ROCURONIUM BROMIDE 100 MG/10ML IV SOLN
INTRAVENOUS | Status: DC | PRN
  Administered 2012-11-16 (×2): 50 mg via INTRAVENOUS

## 2012-11-16 MED ORDER — SODIUM CHLORIDE 0.9 % IJ SOLN
3.0000 mL | Freq: Two times a day (BID) | INTRAMUSCULAR | Status: DC
  Administered 2012-11-17: 6 mL via INTRAVENOUS
  Administered 2012-11-18 – 2012-11-19 (×4): 3 mL via INTRAVENOUS

## 2012-11-16 MED ORDER — MUPIROCIN 2 % EX OINT
1.0000 "application " | TOPICAL_OINTMENT | Freq: Two times a day (BID) | CUTANEOUS | Status: AC
  Administered 2012-11-16 – 2012-11-21 (×10): 1 via NASAL
  Filled 2012-11-16: qty 22

## 2012-11-16 MED ORDER — METOPROLOL TARTRATE 1 MG/ML IV SOLN: 2.5000 mg | INTRAVENOUS | Status: DC | PRN

## 2012-11-16 MED ORDER — VECURONIUM BROMIDE 10 MG IV SOLR
INTRAVENOUS | Status: DC | PRN
  Administered 2012-11-16: 2 mg via INTRAVENOUS
  Administered 2012-11-16: 5 mg via INTRAVENOUS
  Administered 2012-11-16: 3 mg via INTRAVENOUS

## 2012-11-16 MED ORDER — METOPROLOL TARTRATE 12.5 MG HALF TABLET
12.5000 mg | ORAL_TABLET | Freq: Two times a day (BID) | ORAL | Status: DC
  Administered 2012-11-19 – 2012-11-21 (×4): 12.5 mg via ORAL
  Filled 2012-11-16 (×11): qty 1

## 2012-11-16 MED ORDER — SODIUM CHLORIDE 0.9 % IV SOLN: 250.0000 mL | INTRAVENOUS | Status: DC

## 2012-11-16 MED ORDER — TRAMADOL HCL 50 MG PO TABS: 50.0000 mg | ORAL_TABLET | Freq: Four times a day (QID) | ORAL | Status: DC | PRN

## 2012-11-16 MED ORDER — VANCOMYCIN HCL IN DEXTROSE 1-5 GM/200ML-% IV SOLN
1000.0000 mg | Freq: Once | INTRAVENOUS | Status: DC
  Filled 2012-11-16: qty 200

## 2012-11-16 MED ORDER — HEPARIN SODIUM (PORCINE) 1000 UNIT/ML IJ SOLN
INTRAMUSCULAR | Status: DC | PRN
  Administered 2012-11-16: 19000 [IU] via INTRAVENOUS
  Administered 2012-11-16: 3000 [IU] via INTRAVENOUS

## 2012-11-16 MED ORDER — FENTANYL CITRATE 0.05 MG/ML IJ SOLN
INTRAMUSCULAR | Status: DC | PRN
  Administered 2012-11-16 (×2): 100 ug via INTRAVENOUS
  Administered 2012-11-16: 350 ug via INTRAVENOUS
  Administered 2012-11-16 (×2): 100 ug via INTRAVENOUS
  Administered 2012-11-16: 150 ug via INTRAVENOUS
  Administered 2012-11-16: 50 ug via INTRAVENOUS

## 2012-11-16 MED ORDER — DOPAMINE-DEXTROSE 3.2-5 MG/ML-% IV SOLN
0.0000 ug/kg/min | INTRAVENOUS | Status: DC
  Administered 2012-11-16: 2 ug/kg/min via INTRAVENOUS

## 2012-11-16 MED ORDER — MORPHINE SULFATE 2 MG/ML IJ SOLN
2.0000 mg | INTRAMUSCULAR | Status: DC | PRN
Start: 2012-11-16 — End: 2012-11-20
  Administered 2012-11-17 (×5): 2 mg via INTRAVENOUS
  Filled 2012-11-16 (×5): qty 1

## 2012-11-16 MED ORDER — 0.9 % SODIUM CHLORIDE (POUR BTL) OPTIME
TOPICAL | Status: DC | PRN
  Administered 2012-11-16: 6000 mL

## 2012-11-16 MED ORDER — ALBUMIN HUMAN 5 % IV SOLN
12.5000 g | Freq: Once | INTRAVENOUS | Status: AC | PRN
  Administered 2012-11-16: 12.5 g via INTRAVENOUS
  Filled 2012-11-16: qty 250

## 2012-11-16 MED ORDER — ASPIRIN 81 MG PO CHEW: 324.0000 mg | CHEWABLE_TABLET | Freq: Every day | ORAL | Status: DC

## 2012-11-16 MED ORDER — MIDAZOLAM HCL 2 MG/2ML IJ SOLN: 2.0000 mg | INTRAMUSCULAR | Status: DC | PRN

## 2012-11-16 MED ORDER — PHENYLEPHRINE HCL 10 MG/ML IJ SOLN
0.0000 ug/min | INTRAVENOUS | Status: DC
  Administered 2012-11-16: 20 ug/min via INTRAVENOUS
  Administered 2012-11-17: 40 ug/min via INTRAVENOUS
  Filled 2012-11-16 (×3): qty 2

## 2012-11-16 MED ORDER — SODIUM CHLORIDE 0.9 % IJ SOLN
3.0000 mL | INTRAMUSCULAR | Status: DC | PRN
  Administered 2012-11-17: 9 mL via INTRAVENOUS
  Administered 2012-11-17 (×2): 3 mL via INTRAVENOUS

## 2012-11-16 MED ORDER — LIDOCAINE HCL (CARDIAC) 20 MG/ML IV SOLN
INTRAVENOUS | Status: DC | PRN
  Administered 2012-11-16: 50 mg via INTRAVENOUS

## 2012-11-16 MED ORDER — ALBUMIN HUMAN 5 % IV SOLN
INTRAVENOUS | Status: DC | PRN
  Administered 2012-11-16: 14:00:00 via INTRAVENOUS

## 2012-11-16 MED ORDER — ALBUMIN HUMAN 5 % IV SOLN
250.0000 mL | INTRAVENOUS | Status: AC | PRN
  Administered 2012-11-16 (×4): 250 mL via INTRAVENOUS
  Filled 2012-11-16 (×2): qty 250

## 2012-11-16 MED ORDER — METOCLOPRAMIDE HCL 5 MG/ML IJ SOLN
10.0000 mg | Freq: Four times a day (QID) | INTRAMUSCULAR | Status: AC
  Administered 2012-11-16 – 2012-11-17 (×4): 10 mg via INTRAVENOUS
  Filled 2012-11-16 (×5): qty 2

## 2012-11-16 MED ORDER — SODIUM CHLORIDE 0.45 % IV SOLN
INTRAVENOUS | Status: DC
  Administered 2012-11-16: 20 mL/h via INTRAVENOUS
  Administered 2012-11-17 – 2012-11-18 (×2): via INTRAVENOUS

## 2012-11-16 MED ORDER — ALBUMIN HUMAN 5 % IV SOLN: 12.5000 g | Freq: Once | INTRAVENOUS | Status: DC

## 2012-11-16 MED ORDER — VANCOMYCIN HCL IN DEXTROSE 1-5 GM/200ML-% IV SOLN
1000.0000 mg | Freq: Two times a day (BID) | INTRAVENOUS | Status: AC
  Administered 2012-11-16 – 2012-11-17 (×3): 1000 mg via INTRAVENOUS
  Filled 2012-11-16 (×3): qty 200

## 2012-11-16 MED ORDER — SODIUM CHLORIDE 0.9 % IV SOLN
INTRAVENOUS | Status: AC
  Administered 2012-11-16: 17:00:00 via INTRAVENOUS
  Administered 2012-11-16: 0.8 [IU]/h via INTRAVENOUS
  Filled 2012-11-16: qty 1

## 2012-11-16 MED ORDER — ASPIRIN EC 325 MG PO TBEC
325.0000 mg | DELAYED_RELEASE_TABLET | Freq: Every day | ORAL | Status: DC
  Administered 2012-11-17 – 2012-11-21 (×4): 325 mg via ORAL
  Filled 2012-11-16 (×5): qty 1

## 2012-11-16 MED ORDER — BISACODYL 5 MG PO TBEC
10.0000 mg | DELAYED_RELEASE_TABLET | Freq: Every day | ORAL | Status: DC
  Administered 2012-11-17 – 2012-11-21 (×5): 10 mg via ORAL
  Filled 2012-11-16 (×5): qty 2

## 2012-11-16 MED ORDER — MIDAZOLAM HCL 2 MG/2ML IJ SOLN
INTRAMUSCULAR | Status: AC
  Filled 2012-11-16: qty 2

## 2012-11-16 MED ORDER — FENTANYL CITRATE 0.05 MG/ML IJ SOLN
INTRAMUSCULAR | Status: AC
  Filled 2012-11-16: qty 2

## 2012-11-16 MED ORDER — LACTATED RINGERS IV SOLN
INTRAVENOUS | Status: DC
  Administered 2012-11-16: 20 mL/h via INTRAVENOUS

## 2012-11-16 MED ORDER — CHLORHEXIDINE GLUCONATE CLOTH 2 % EX PADS
6.0000 | MEDICATED_PAD | Freq: Every day | CUTANEOUS | Status: AC
  Administered 2012-11-16 – 2012-11-20 (×5): 6 via TOPICAL

## 2012-11-16 MED ORDER — PROPOFOL 10 MG/ML IV BOLUS
INTRAVENOUS | Status: DC | PRN
  Administered 2012-11-16: 150 mg via INTRAVENOUS

## 2012-11-16 MED ORDER — MAGNESIUM SULFATE 40 MG/ML IJ SOLN
4.0000 g | Freq: Once | INTRAMUSCULAR | Status: AC
  Administered 2012-11-16: 4 g via INTRAVENOUS
  Filled 2012-11-16: qty 100

## 2012-11-16 MED FILL — Cefuroxime Sodium For Inj 750 MG: INTRAMUSCULAR | Qty: 750 | Status: AC

## 2012-11-16 MED FILL — Potassium Chloride Inj 2 mEq/ML: INTRAVENOUS | Qty: 40 | Status: AC

## 2012-11-16 MED FILL — Magnesium Sulfate Inj 50%: INTRAMUSCULAR | Qty: 10 | Status: AC

## 2012-11-16 SURGICAL SUPPLY — 116 items
ADAPTER CARDIO PERF ANTE/RETRO (ADAPTER) ×4 IMPLANT
ATTRACTOMAT 16X20 MAGNETIC DRP (DRAPES) ×4 IMPLANT
BAG DECANTER FOR FLEXI CONT (MISCELLANEOUS) ×4 IMPLANT
BANDAGE ELASTIC 4 VELCRO ST LF (GAUZE/BANDAGES/DRESSINGS) ×8 IMPLANT
BANDAGE ELASTIC 6 VELCRO ST LF (GAUZE/BANDAGES/DRESSINGS) ×8 IMPLANT
BANDAGE GAUZE ELAST BULKY 4 IN (GAUZE/BANDAGES/DRESSINGS) ×8 IMPLANT
BASKET HEART  (ORDER IN 25'S) (MISCELLANEOUS) ×1
BASKET HEART (ORDER IN 25'S) (MISCELLANEOUS) ×1
BASKET HEART (ORDER IN 25S) (MISCELLANEOUS) ×2 IMPLANT
BLADE MINI RND TIP GREEN BEAV (BLADE) ×8 IMPLANT
BLADE STERNUM SYSTEM 6 (BLADE) ×4 IMPLANT
BLADE SURG 11 STRL SS (BLADE) ×4 IMPLANT
BLADE SURG 12 STRL SS (BLADE) ×4 IMPLANT
BLADE SURG ROTATE 9660 (MISCELLANEOUS) IMPLANT
CANISTER SUCTION 2500CC (MISCELLANEOUS) ×4 IMPLANT
CANNULA AORTIC HI-FLOW 6.5M20F (CANNULA) ×4 IMPLANT
CANNULA GUNDRY RCSP 15FR (MISCELLANEOUS) ×4 IMPLANT
CANNULA VENOUS MAL SGL STG 40 (MISCELLANEOUS) IMPLANT
CANNULAE VENOUS MAL SGL STG 40 (MISCELLANEOUS)
CATH CPB KIT VANTRIGT (MISCELLANEOUS) ×4 IMPLANT
CATH ROBINSON RED A/P 18FR (CATHETERS) ×12 IMPLANT
CATH THORACIC 28FR (CATHETERS) IMPLANT
CATH THORACIC 28FR RT ANG (CATHETERS) IMPLANT
CATH THORACIC 36FR (CATHETERS) IMPLANT
CATH THORACIC 36FR RT ANG (CATHETERS) ×8 IMPLANT
CLIP FOGARTY SPRING 6M (CLIP) ×4 IMPLANT
CLIP TI WIDE RED SMALL 24 (CLIP) ×4 IMPLANT
CLOTH BEACON ORANGE TIMEOUT ST (SAFETY) ×4 IMPLANT
COVER SURGICAL LIGHT HANDLE (MISCELLANEOUS) ×4 IMPLANT
CRADLE DONUT ADULT HEAD (MISCELLANEOUS) ×4 IMPLANT
DRAIN CHANNEL 32F RND 10.7 FF (WOUND CARE) ×4 IMPLANT
DRAPE CARDIOVASCULAR INCISE (DRAPES) ×2
DRAPE SLUSH MACHINE 52X66 (DRAPES) IMPLANT
DRAPE SLUSH/WARMER DISC (DRAPES) ×4 IMPLANT
DRAPE SRG 135X102X78XABS (DRAPES) ×2 IMPLANT
DRSG COVADERM 4X14 (GAUZE/BANDAGES/DRESSINGS) ×4 IMPLANT
ELECT BLADE 4.0 EZ CLEAN MEGAD (MISCELLANEOUS) ×4
ELECT BLADE 6.5 EXT (BLADE) ×4 IMPLANT
ELECT CAUTERY BLADE 6.4 (BLADE) ×4 IMPLANT
ELECT REM PT RETURN 9FT ADLT (ELECTROSURGICAL) ×8
ELECTRODE BLDE 4.0 EZ CLN MEGD (MISCELLANEOUS) ×2 IMPLANT
ELECTRODE REM PT RTRN 9FT ADLT (ELECTROSURGICAL) ×4 IMPLANT
GLOVE BIO SURGEON STRL SZ 6 (GLOVE) ×28 IMPLANT
GLOVE BIO SURGEON STRL SZ 6.5 (GLOVE) ×9 IMPLANT
GLOVE BIO SURGEON STRL SZ7.5 (GLOVE) ×8 IMPLANT
GLOVE BIO SURGEONS STRL SZ 6.5 (GLOVE) ×3
GLOVE BIOGEL PI IND STRL 6 (GLOVE) ×4 IMPLANT
GLOVE BIOGEL PI IND STRL 7.0 (GLOVE) ×8 IMPLANT
GLOVE BIOGEL PI INDICATOR 6 (GLOVE) ×4
GLOVE BIOGEL PI INDICATOR 7.0 (GLOVE) ×8
GOWN EXTRA PROTECTION XL (GOWNS) ×4 IMPLANT
GOWN STRL NON-REIN LRG LVL3 (GOWN DISPOSABLE) ×36 IMPLANT
HEMOSTAT POWDER SURGIFOAM 1G (HEMOSTASIS) ×12 IMPLANT
HEMOSTAT SURGICEL 2X14 (HEMOSTASIS) ×4 IMPLANT
INSERT FOGARTY XLG (MISCELLANEOUS) IMPLANT
KIT BASIN OR (CUSTOM PROCEDURE TRAY) ×4 IMPLANT
KIT ROOM TURNOVER OR (KITS) ×4 IMPLANT
KIT SUCTION CATH 14FR (SUCTIONS) ×4 IMPLANT
KIT VASOVIEW W/TROCAR VH 2000 (KITS) ×4 IMPLANT
LEAD PACING MYOCARDI (MISCELLANEOUS) ×4 IMPLANT
MARKER GRAFT CORONARY BYPASS (MISCELLANEOUS) ×12 IMPLANT
NS IRRIG 1000ML POUR BTL (IV SOLUTION) ×28 IMPLANT
PACK OPEN HEART (CUSTOM PROCEDURE TRAY) ×4 IMPLANT
PAD ARMBOARD 7.5X6 YLW CONV (MISCELLANEOUS) ×4 IMPLANT
PENCIL BUTTON HOLSTER BLD 10FT (ELECTRODE) ×4 IMPLANT
PUNCH AORTIC ROTATE 4.0MM (MISCELLANEOUS) IMPLANT
PUNCH AORTIC ROTATE 4.5MM 8IN (MISCELLANEOUS) ×4 IMPLANT
PUNCH AORTIC ROTATE 5MM 8IN (MISCELLANEOUS) IMPLANT
SET CARDIOPLEGIA MPS 5001102 (MISCELLANEOUS) ×4 IMPLANT
SOLUTION ANTI FOG 6CC (MISCELLANEOUS) ×4 IMPLANT
SPONGE GAUZE 4X4 12PLY (GAUZE/BANDAGES/DRESSINGS) ×20 IMPLANT
SPONGE LAP 18X18 X RAY DECT (DISPOSABLE) ×4 IMPLANT
SURGIFLO W/THROMBIN 8M KIT (HEMOSTASIS) ×4 IMPLANT
SUT BONE WAX W31G (SUTURE) ×4 IMPLANT
SUT MNCRL AB 4-0 PS2 18 (SUTURE) ×8 IMPLANT
SUT PROLENE 3 0 SH DA (SUTURE) IMPLANT
SUT PROLENE 3 0 SH1 36 (SUTURE) IMPLANT
SUT PROLENE 4 0 RB 1 (SUTURE) ×4
SUT PROLENE 4 0 SH DA (SUTURE) ×4 IMPLANT
SUT PROLENE 4-0 RB1 .5 CRCL 36 (SUTURE) ×4 IMPLANT
SUT PROLENE 5 0 C 1 36 (SUTURE) IMPLANT
SUT PROLENE 6 0 C 1 30 (SUTURE) ×20 IMPLANT
SUT PROLENE 7 0 DA (SUTURE) IMPLANT
SUT PROLENE 7.0 RB 3 (SUTURE) ×16 IMPLANT
SUT PROLENE 8 0 BV175 6 (SUTURE) IMPLANT
SUT PROLENE BLUE 7 0 (SUTURE) ×8 IMPLANT
SUT PROLENE POLY MONO (SUTURE) ×8 IMPLANT
SUT SILK  1 MH (SUTURE)
SUT SILK 1 MH (SUTURE) IMPLANT
SUT SILK 2 0 SH CR/8 (SUTURE) IMPLANT
SUT SILK 3 0 SH CR/8 (SUTURE) IMPLANT
SUT STEEL 6MS V (SUTURE) ×8 IMPLANT
SUT STEEL STERNAL CCS#1 18IN (SUTURE) IMPLANT
SUT STEEL SZ 6 DBL 3X14 BALL (SUTURE) ×4 IMPLANT
SUT VIC AB 1 CTX 36 (SUTURE) ×4
SUT VIC AB 1 CTX36XBRD ANBCTR (SUTURE) ×4 IMPLANT
SUT VIC AB 2-0 CT1 27 (SUTURE) ×4
SUT VIC AB 2-0 CT1 TAPERPNT 27 (SUTURE) ×4 IMPLANT
SUT VIC AB 2-0 CTX 27 (SUTURE) IMPLANT
SUT VIC AB 3-0 SH 27 (SUTURE)
SUT VIC AB 3-0 SH 27X BRD (SUTURE) IMPLANT
SUT VIC AB 3-0 X1 27 (SUTURE) IMPLANT
SUT VICRYL 4-0 PS2 18IN ABS (SUTURE) IMPLANT
SUTURE E-PAK OPEN HEART (SUTURE) ×4 IMPLANT
SYSTEM SAHARA CHEST DRAIN ATS (WOUND CARE) ×4 IMPLANT
TAPE CLOTH SOFT 2X10 (GAUZE/BANDAGES/DRESSINGS) ×4 IMPLANT
TAPE CLOTH SURG 4X10 WHT LF (GAUZE/BANDAGES/DRESSINGS) ×4 IMPLANT
TAPE PAPER 2X10 WHT MICROPORE (GAUZE/BANDAGES/DRESSINGS) ×4 IMPLANT
TAPE PAPER 3X10 WHT MICROPORE (GAUZE/BANDAGES/DRESSINGS) ×4 IMPLANT
TOWEL OR 17X24 6PK STRL BLUE (TOWEL DISPOSABLE) ×8 IMPLANT
TOWEL OR 17X26 10 PK STRL BLUE (TOWEL DISPOSABLE) ×8 IMPLANT
TRAY FOLEY IC TEMP SENS 14FR (CATHETERS) ×4 IMPLANT
TUBING INSUFFLATION 10FT LAP (TUBING) ×4 IMPLANT
UNDERPAD 30X30 INCONTINENT (UNDERPADS AND DIAPERS) ×4 IMPLANT
WATER STERILE IRR 1000ML POUR (IV SOLUTION) ×8 IMPLANT
YANKAUER SUCT BULB TIP NO VENT (SUCTIONS) ×4 IMPLANT

## 2012-11-16 NOTE — Progress Notes (Signed)
MD made aware that patient is bleeding from CT, per Dr. Donata Clay, changed PEEP to 8 and ordered one unit of FFP to be transfused.  Will continue to monitor bleeding.

## 2012-11-16 NOTE — Procedures (Signed)
Extubation Procedure Note  Patient Details:   Name: Bobby [name] DOB: [birth year] MRN: [id number]   Airway Documentation:  Airway 8 mm (Active)  Secured at (cm) 22 cm 11/16/2012  7:56 PM  Measured From Lips 11/16/2012  7:56 PM  Secured Location Right 11/16/2012  7:56 PM  Secured By Pink Tape 11/16/2012  7:56 PM  Cuff Pressure (cm H2O) 26 cm H2O 11/16/2012  1:55 PM  Site Condition Dry 11/16/2012  7:56 PM    Evaluation  O2 sats: stable throughout Complications: No apparent complications Patient did tolerate procedure well. Bilateral Breath Sounds: Clear;Diminished   Yes  Arelia Sneddon 11/16/2012, 10:42 PM

## 2012-11-16 NOTE — Progress Notes (Signed)
[facility] BRIEF SICU PROGRESS NOTE  Day of Surgery  S/P Procedure(s) (LRB): CORONARY ARTERY BYPASS GRAFTING (CABG) (N/A) INTRAOPERATIVE TRANSESOPHAGEAL ECHOCARDIOGRAM (N/A)   Sedated on vent NSR w/ BP 128/68 on Neo drip C.I. 2.0 w/ PA pressures relatively low Chest tube output trending down Labs okay  Plan: Continue routine early postop.  OWEN,CLARENCE H 11/16/2012 4:15 PM

## 2012-11-16 NOTE — Brief Op Note (Signed)
11/14/2012 - 11/16/2012  11:35 AM  PATIENT:  Bobby [name]  78 y.o. male  PRE-OPERATIVE DIAGNOSIS:  CAD  POST-OPERATIVE DIAGNOSIS:  Coronary Artery Disease  PROCEDURE:  Procedure(s) (LRB) with comments:  CORONARY ARTERY BYPASS GRAFTING x4 -LIMA to LAD -SVG to RCA -SVG to OM1 -SVG to DIAGONAL   ENDOSCOPIC SAPHENOUS VEIN HARVEST RIGHT AND LEFT THIGH  INTRAOPERATIVE TRANSESOPHAGEAL ECHOCARDIOGRAM (N/A)  SURGEON:  Surgeon(s) and Role:    * Kerin Perna, MD - Primary  PHYSICIAN ASSISTANT: Lowella Dandy PA-C  ANESTHESIA:   general  EBL:  Total I/O In: 1000 [I.V.:1000] Out: 975 [Urine:975]  BLOOD ADMINISTERED:1 unit CC PRBC,  CC CELLSAVER, 1 FFP and 1. PLTS  DRAINS: Left pleural chest tube, mediastinal chest drain   LOCAL MEDICATIONS USED:  NONE  SPECIMEN:  No Specimen  DISPOSITION OF SPECIMEN:  N/A  COUNTS:  YES  TOURNIQUET:  * No tourniquets in log *  DICTATION: .Dragon Dictation  PLAN OF CARE: Admit to inpatient   PATIENT DISPOSITION:  ICU - intubated and hemodynamically stable.   Delay start of Pharmacological VTE agent (>24hrs) due to surgical blood loss or risk of bleeding: yes

## 2012-11-16 NOTE — Anesthesia Postprocedure Evaluation (Signed)
  Anesthesia Post-op Note  Patient: Bobby [name]  Procedure(s) Performed: Procedure(s) (LRB) with comments: CORONARY ARTERY BYPASS GRAFTING (CABG) (N/A) - times four using Bilateral Greater Saphenous Vein Graft harvested endoscopically from bilateral thighs, and Left Internal Mammary Artery. INTRAOPERATIVE TRANSESOPHAGEAL ECHOCARDIOGRAM (N/A)  Patient Location: PACU and SICU  Anesthesia Type:General  Level of Consciousness: sedated  Airway and Oxygen Therapy: Patient remains intubated per anesthesia plan  Post-op Pain: none  Post-op Assessment: Post-op Vital signs reviewed, Patient's Cardiovascular Status Stable, Respiratory Function Stable, Patent Airway, No signs of Nausea or vomiting and Pain level controlled  Post-op Vital Signs: Reviewed and stable  Complications: No apparent anesthesia complications

## 2012-11-16 NOTE — Progress Notes (Signed)
RT extubated patient to 4lpm Sullivan per protocol. NIF OF-38 and VC of 850cc, no stridor noted. Patient is tolerated procedure well. RT will continue to monitor.

## 2012-11-16 NOTE — OR Nursing (Signed)
2300 called for 45 minute call at 1240.

## 2012-11-16 NOTE — Preoperative (Signed)
Beta Blockers   Reason not to administer Beta Blockers:Not Applicable 

## 2012-11-16 NOTE — Progress Notes (Signed)
The patient was examined and preop studies reviewed. There has been no change from the prior exam and the patient is ready for surgery. Plan CABG on H [place] this am

## 2012-11-16 NOTE — OR Nursing (Signed)
2300 called at 1308 for 20 minute call.

## 2012-11-16 NOTE — Transfer of Care (Signed)
Immediate Anesthesia Transfer of Care Note  Patient: Bobby [name]  Procedure(s) Performed: Procedure(s) (LRB) with comments: CORONARY ARTERY BYPASS GRAFTING (CABG) (N/A) - times four using Bilateral Greater Saphenous Vein Graft harvested endoscopically from bilateral thighs, and Left Internal Mammary Artery. INTRAOPERATIVE TRANSESOPHAGEAL ECHOCARDIOGRAM (N/A)  Patient Location: SICU  Anesthesia Type:General  Level of Consciousness: sedated and Patient remains intubated per anesthesia plan  Airway & Oxygen Therapy: Patient remains intubated per anesthesia plan  Post-op Assessment: Report given to PACU RN and Post -op Vital signs reviewed and stable  Post vital signs: Reviewed and stable  Complications: No apparent anesthesia complications

## 2012-11-16 NOTE — Anesthesia Preprocedure Evaluation (Addendum)
Anesthesia Evaluation  Patient identified by MRN, date of birth, ID band Patient awake    Reviewed: Allergy & Precautions, H&P , NPO status , Patient's Chart, lab work & pertinent test results, reviewed documented beta blocker date and time   History of Anesthesia Complications (+) PONV  Airway Mallampati: II TM Distance: >3 FB Neck ROM: full    Dental  (+) Edentulous Upper   Pulmonary neg pulmonary ROS,  breath sounds clear to auscultation        Cardiovascular + CAD, + Past MI and + Peripheral Vascular Disease Rhythm:regular     Neuro/Psych negative neurological ROS  negative psych ROS   GI/Hepatic Neg liver ROS, GERD-  Medicated and Controlled,  Endo/Other  negative endocrine ROS  Renal/GU negative Renal ROS  negative genitourinary   Musculoskeletal   Abdominal   Peds  Hematology negative hematology ROS (+)   Anesthesia Other Findings See surgeon's H&P   Reproductive/Obstetrics negative OB ROS                          Anesthesia Physical Anesthesia Plan  ASA: III  Anesthesia Plan: General   Post-op Pain Management:    Induction: Intravenous  Airway Management Planned: Oral ETT  Additional Equipment: Arterial line, CVP, PA Cath, TEE and Ultrasound Guidance Line Placement  Intra-op Plan:   Post-operative Plan: Post-operative intubation/ventilation  Informed Consent: I have reviewed the patients History and Physical, chart, labs and discussed the procedure including the risks, benefits and alternatives for the proposed anesthesia with the patient or authorized representative who has indicated his/her understanding and acceptance.   Dental Advisory Given  Plan Discussed with: CRNA and Surgeon  Anesthesia Plan Comments:         Anesthesia Quick Evaluation

## 2012-11-16 NOTE — Progress Notes (Signed)
  Echocardiogram Echocardiogram Transesophageal has been performed.  Georgian Co 11/16/2012, 8:33 AM

## 2012-11-16 NOTE — Progress Notes (Signed)
RT placed patient on PSV per weaning protocol. Patient is tolerating well at this time.

## 2012-11-17 ENCOUNTER — Inpatient Hospital Stay (HOSPITAL_COMMUNITY): Payer: Medicare Other

## 2012-11-17 LAB — GLUCOSE, CAPILLARY
Glucose-Capillary: 100 mg/dL — ABNORMAL HIGH (ref 70–99)
Glucose-Capillary: 101 mg/dL — ABNORMAL HIGH (ref 70–99)
Glucose-Capillary: 101 mg/dL — ABNORMAL HIGH (ref 70–99)
Glucose-Capillary: 105 mg/dL — ABNORMAL HIGH (ref 70–99)
Glucose-Capillary: 106 mg/dL — ABNORMAL HIGH (ref 70–99)
Glucose-Capillary: 106 mg/dL — ABNORMAL HIGH (ref 70–99)
Glucose-Capillary: 108 mg/dL — ABNORMAL HIGH (ref 70–99)
Glucose-Capillary: 114 mg/dL — ABNORMAL HIGH (ref 70–99)
Glucose-Capillary: 114 mg/dL — ABNORMAL HIGH (ref 70–99)
Glucose-Capillary: 115 mg/dL — ABNORMAL HIGH (ref 70–99)
Glucose-Capillary: 122 mg/dL — ABNORMAL HIGH (ref 70–99)
Glucose-Capillary: 130 mg/dL — ABNORMAL HIGH (ref 70–99)
Glucose-Capillary: 145 mg/dL — ABNORMAL HIGH (ref 70–99)
Glucose-Capillary: 99 mg/dL (ref 70–99)

## 2012-11-17 LAB — PREPARE FRESH FROZEN PLASMA
Unit division: 0
Unit division: 0

## 2012-11-17 LAB — BASIC METABOLIC PANEL
BUN: 18 mg/dL (ref 6–23)
Calcium: 8 mg/dL — ABNORMAL LOW (ref 8.4–10.5)
Creatinine, Ser: 1.06 mg/dL (ref 0.50–1.35)
GFR calc Af Amer: 76 mL/min — ABNORMAL LOW (ref >90)
GFR calc non Af Amer: 65 mL/min — ABNORMAL LOW (ref >90)

## 2012-11-17 LAB — MAGNESIUM
Magnesium: 2.5 mg/dL (ref 1.5–2.5)
Magnesium: 2.4 mg/dL (ref 1.5–2.5)

## 2012-11-17 LAB — POCT I-STAT, CHEM 8
BUN: 19 mg/dL (ref 6–23)
Calcium, Ion: 1.13 mmol/L (ref 1.13–1.30)
Chloride: 99 mEq/L (ref 96–112)
Creatinine, Ser: 1.1 mg/dL (ref 0.50–1.35)
Glucose, Bld: 135 mg/dL — ABNORMAL HIGH (ref 70–99)
HCT: 26 % — ABNORMAL LOW (ref 39.0–52.0)
Hemoglobin: 8.8 g/dL — ABNORMAL LOW (ref 13.0–17.0)
Potassium: 4.3 mEq/L (ref 3.5–5.1)
Sodium: 132 mEq/L — ABNORMAL LOW (ref 135–145)
TCO2: 22 mmol/L (ref 0–100)

## 2012-11-17 LAB — CBC
HCT: 27 % — ABNORMAL LOW (ref 39.0–52.0)
Hemoglobin: 9.2 g/dL — ABNORMAL LOW (ref 13.0–17.0)
MCH: 31.1 pg (ref 26.0–34.0)
MCH: 30.3 pg (ref 26.0–34.0)
MCHC: 37.4 g/dL — ABNORMAL HIGH (ref 30.0–36.0)
MCV: 83.1 fL (ref 78.0–100.0)
Platelets: 107 10*3/uL — ABNORMAL LOW (ref 150–400)
Platelets: 110 10*3/uL — ABNORMAL LOW (ref 150–400)
RBC: 3.04 MIL/uL — ABNORMAL LOW (ref 4.22–5.81)
RDW: 14 % (ref 11.5–15.5)
WBC: 5.8 10*3/uL (ref 4.0–10.5)

## 2012-11-17 LAB — CREATININE, SERUM
Creatinine, Ser: 1.19 mg/dL (ref 0.50–1.35)
GFR calc non Af Amer: 57 mL/min — ABNORMAL LOW (ref >90)

## 2012-11-17 LAB — PREPARE PLATELET PHERESIS: Unit division: 0

## 2012-11-17 MED ORDER — INSULIN ASPART 100 UNIT/ML ~~LOC~~ SOLN
0.0000 [IU] | SUBCUTANEOUS | Status: DC
  Administered 2012-11-17 – 2012-11-19 (×6): 2 [IU] via SUBCUTANEOUS

## 2012-11-17 MED ORDER — INSULIN GLARGINE 100 UNIT/ML ~~LOC~~ SOLN
12.0000 [IU] | Freq: Every day | SUBCUTANEOUS | Status: DC
  Administered 2012-11-17 – 2012-11-20 (×3): 12 [IU] via SUBCUTANEOUS

## 2012-11-17 MED ORDER — HYDROCODONE-ACETAMINOPHEN 5-325 MG PO TABS
1.0000 | ORAL_TABLET | ORAL | Status: DC | PRN
  Administered 2012-11-17: 1 via ORAL
  Administered 2012-11-17 – 2012-11-18 (×2): 2 via ORAL
  Administered 2012-11-18 – 2012-11-20 (×3): 1 via ORAL
  Administered 2012-11-20: 2 via ORAL
  Filled 2012-11-17: qty 2
  Filled 2012-11-17: qty 1
  Filled 2012-11-17 (×2): qty 2
  Filled 2012-11-17: qty 1
  Filled 2012-11-17: qty 2
  Filled 2012-11-17: qty 1

## 2012-11-17 MED ORDER — FUROSEMIDE 10 MG/ML IJ SOLN
20.0000 mg | Freq: Two times a day (BID) | INTRAMUSCULAR | Status: DC
  Administered 2012-11-17 – 2012-11-18 (×4): 20 mg via INTRAVENOUS
  Filled 2012-11-17 (×7): qty 2

## 2012-11-17 MED ORDER — FE FUMARATE-B12-VIT C-FA-IFC PO CAPS
1.0000 | ORAL_CAPSULE | Freq: Two times a day (BID) | ORAL | Status: DC
  Administered 2012-11-17 – 2012-11-21 (×8): 1 via ORAL
  Filled 2012-11-17 (×11): qty 1

## 2012-11-17 MED ORDER — TRAMADOL HCL 50 MG PO TABS: 50.0000 mg | ORAL_TABLET | Freq: Four times a day (QID) | ORAL | Status: DC | PRN

## 2012-11-17 NOTE — Progress Notes (Signed)
OOB to chair.  HR 140s SBP 104-108.  Neo increased slightly. Pacer off.  Underlying rhythm 100-110.  PAC infrequent. Patient denies SOB or CP.  A line repositioned and zeroed. Correlates with NIBP.

## 2012-11-17 NOTE — Care Management Note (Addendum)
    Page 1 of 2   11/21/2012     9:35:43 AM   CARE MANAGEMENT NOTE 11/21/2012  Patient:  Bobby [name],Bobby [name]   Account Number:  [id number]  Date Initiated:  11/17/2012  Documentation initiated by:  Verdis Prime  Subjective/Objective Assessment:   78 yr-old male adm with dx of CAD s/p CABG; lives with spouse, has h/o home health services through [facility].     Action/Plan:   Anticipated DC Date:  11/21/2012   Anticipated DC Plan:  HOME W HOME HEALTH SERVICES      DC Planning Services  CM consult      [facility] Choice  HOME HEALTH   Choice offered to / List presented to:  C-1 Patient        HH arranged  HH-1 RN  HH-2 PT      [facility]  [facility]   Status of service:   Medicare Important Message given?   (If response is "NO", the following Medicare IM given date fields will be blank) Date Medicare IM given:   Date Additional Medicare IM given:    Discharge Disposition:  HOME W HOME HEALTH SERVICES  Per UR Regulation:  Reviewed for med. necessity/level of care/duration of stay  If discussed at Long Length of Stay Meetings, dates discussed:    Comments:  12/31 9:33a Bobby Bartlomiej Jenkinson rn,bsn spoke w pt,soninlaw and da Bobby [name] [phone], pt lives w wife who has poor eyesight. pt will prob not get to outpt card rehab til he can drive. they prefer [facility] to start. he has used [facility] and would like them again. ref to [employer] w [facility] for rn and pt. have left sticky note for md to write [facility] orders and do face to face.  11/17/12 1450 Henrietta Mayo RN BSN MSN CCM Received referral to assist family in obtaining lift chair for pt.  Advised family to take script to equipment company, medicare will pay 80% of cost of motor, remaining cost will be pt's responsibility.  Son states he will do an Therapist, art, is confident he can find a used chair for pt that will be less expensive.

## 2012-11-17 NOTE — Progress Notes (Signed)
POD # 1 CABG x 4  Getting back in bed  BP 107/57  Pulse 48  Temp 99.3 F (37.4 C) (Core (Comment))  Resp 27  Ht 5\' 8"  (1.727 m)  Wt 202 lb 13.2 oz (92 kg)  BMI 30.84 kg/m2  SpO2 80%   Intake/Output Summary (Last 24 hours) at 11/17/12 1711 Last data filed at 11/17/12 1500  Gross per 24 hour  Intake 3458.5 ml  Output   3235 ml  Net  223.5 ml   K 4.3, Hct 26 CBG well controlled  Doing well POD # 1

## 2012-11-17 NOTE — Progress Notes (Signed)
1 Day Post-Op Procedure(s) (LRB): CORONARY ARTERY BYPASS GRAFTING (CABG) (N/A) INTRAOPERATIVE TRANSESOPHAGEAL ECHOCARDIOGRAM (N/A) Subjective: cabg for [facility] from [facility] periop bleeding from Brilinta, Hb now 10  Objective: Vital signs in last 24 hours: Temp:  [95.7 F (35.4 C)-99.7 F (37.6 C)] 99.5 F (37.5 C) (12/27 0715) Pulse Rate:  [89-99] 90  (12/27 0715) Cardiac Rhythm:  [-] Normal sinus rhythm (12/27 0400) Resp:  [8-29] 18  (12/27 0715) BP: (74-158)/(40-95) 100/55 mmHg (12/27 0715) SpO2:  [94 %-100 %] 97 % (12/27 0715) Arterial Line BP: (80-172)/(48-86) 100/50 mmHg (12/27 0715) FiO2 (%):  [40 %-50 %] 40 % (12/26 2154) Weight:  [202 lb 13.2 oz (92 kg)] 202 lb 13.2 oz (92 kg) (12/27 0500)  Hemodynamic parameters for last 24 hours: PAP: (25-36)/(13-21) 27/13 mmHg CO:  [2.8 L/min-4.6 L/min] 4.5 L/min CI:  [1.3 L/min/m2-2.3 L/min/m2] 2.2 L/min/m2  Intake/Output from previous day: 12/26 0701 - 12/27 0700 In: 8865.8 [I.V.:4538.3; Blood:2321.5; IV Piggyback:2006] Out: 7770 [Urine:4790; Blood:1625; Chest Tube:1355] Intake/Output this shift:    Neuro intact Hemodynamics stable on neo, renal dopamine A-pacing 90 Lab Results:  Basename 11/17/12 0425 11/16/12 2016 11/16/12 2000  WBC 5.8 -- 5.0  HGB 10.1* 6.1* --  HCT 27.0* 18.0* --  PLT 107* -- 110*   BMET:  Basename 11/17/12 0425 11/16/12 2016 11/14/12 1843  NA 131* 134* --  K 4.1 4.3 --  CL 101 102 --  CO2 21 -- 20  GLUCOSE 100* 110* --  BUN 18 18 --  CREATININE 1.06 1.00 --  CALCIUM 8.0* -- 9.5    PT/INR:  Basename 11/16/12 1408  LABPROT 16.9*  INR 1.41   ABG    Component Value Date/Time   PHART 7.414 11/16/2012 2330   HCO3 22.4 11/16/2012 2330   TCO2 23 11/16/2012 2330   ACIDBASEDEF 2.0 11/16/2012 2330   O2SAT 98.0 11/16/2012 2330   CBG (last 3)   Basename 11/17/12 0004 11/16/12 2255 11/16/12 2149  GLUCAP 122* 141* 149*    Assessment/Plan: S/P Procedure(s) (LRB): CORONARY ARTERY BYPASS GRAFTING  (CABG) (N/A) INTRAOPERATIVE TRANSESOPHAGEAL ECHOCARDIOGRAM (N/A) See progression orders Wean pressors DM control  LOS: 3 days    VAN TRIGT III,Sahith Nurse 11/17/2012

## 2012-11-17 NOTE — Op Note (Signed)
NAME:  Bobby [name], Bobby [name]               ACCOUNT NO.:  [id number]  MEDICAL RECORD NO.:  [id number]  LOCATION:  2311                         FACILITY:  [facility]  PHYSICIAN:  Kerin Perna, M.D.  DATE OF BIRTH:  03/07/1932  DATE OF PROCEDURE:  11/16/2012 DATE OF DISCHARGE:                              OPERATIVE REPORT   OPERATION: 1. Coronary artery bypass grafting x4 (left internal mammary artery to     left anterior descending, saphenous vein graft to diagonal,     saphenous vein graft to obtuse marginal, saphenous vein graft to     right coronary artery). 2. Endoscopic harvest of bilateral greater saphenous vein.  SURGEON:  Kerin Perna, M.D.  ASSISTANT:  Pauline Good, PA-C.  ANESTHESIA:  General.  PREOPERATIVE DIAGNOSIS:  Unstable angina with severe 3-vessel coronary artery disease, preserved left ventricular function.  POSTOPERATIVE DIAGNOSIS:  Unstable angina with severe 3-vessel coronary artery disease, preserved left ventricular function.  INDICATIONS:  The patient is a 78 year old Caucasian male who was transferred from [facility] after being admitted and ruled out for MI and diagnosed as having severe three-vessel disease by cardiac catheterizations.  The patient arrived stable without chest pain on unfractionated heparin, beta-blocker, and p.r.n. nitroglycerin.  A 2D echo at our facility showed good LV function without valvular disease and carotid Dopplers were negative for significant stenosis.  He was felt to be a candidate for coronary artery bypass grafting based on his symptoms and his severe three-vessel coronary anatomy.  I discussed the procedure in detail with the patient including the risks, benefits, alternatives, and expected postoperative recovery.  He understood and agreed to proceed with surgery under what I felt was an informed consent.  OPERATIVE FINDINGS: 1. Poor saphenous vein quality below the knee requiring bilateral     thigh  saphenous vein harvest. 2. Suboptimal targets for grafting, the distal right vessels were too     small to graft and the right graft was placed to the RV branch.     The circumflex was diffusely diseased and there was heavy     calcification in all the vessels.  OPERATIVE PROCEDURE:  The patient was brought to the operating room and placed supine on the operating room table.  General anesthesia was induced under invasive hemodynamic monitoring.  A transesophageal echo probe was placed by the anesthesiologist.  Chest, abdomen, and legs were prepped with Betadine and draped as a sterile field.  A proper time-out was performed.  A sternal incision was made as the saphenous vein was harvested from both legs using endoscopic harvest technique.  The left internal mammary artery was harvested as a pedicle graft from its origin at the subclavian vessels.  It was 1.5-mm vessel, although it had to be shortened due to a small distal diameter.  There was good flow.  A sternal retractor was placed and the pericardium was opened and suspended.  Pursestrings were placed in the ascending aorta and right atrium and heparin was administered after the vein was harvested.  The patient was cannulated and after the ACT was documented as being therapeutic, the patient was placed on cardiopulmonary bypass.  The coronary arteries were identified for  grafting and the mammary artery and vein grafts were prepared for the distal anastomoses.  Cardioplegic cannula was replaced for both antegrade and retrograde cold blood cardioplegia and the patient was cooled to 32 degrees.  The aortic crossclamp was applied.  One liter of cold blood cardioplegia was delivered in split doses between the antegrade, aortic, and retrograde coronary sinus catheters.  There was good cardioplegic arrest and septal temperature dropped to less than 14 degrees.  Cardioplegia was delivered every 20 minutes while the crossclamp was in  place.  The distal coronary anastomoses were then performed.  The first distal anastomosis was to the right coronary.  We initially attempted to find a distal vessel, however, the posterior descending and posterolateral vessels were less than 1 mm and were non graftable.  They filled via collaterals from the left side.  The main right was heavily calcified and occluded.  An RV branch had a 1.5 mm lumen and filled via collaterals, and a reverse saphenous vein was sewn end-to-side with running 7-0 Prolene to this vessel with good flow.  Cardioplegia was redosed.  The second distal anastomosis was the OM branch of the circumflex.  This was heavily calcified.  It was 1.5-mm vessel with eccentric plaque.  A reverse saphenous vein was sewn end-to-side with running 7-0 Prolene with good flow through the graft.  The third distal anastomosis was to the diagonal branch to LAD.  The ramus branch was too heavily calcified and too small to graft.  The diagonal however did have a proximal 90% stenosis and a reverse saphenous vein was sewn end-to- side with running 7-0 Prolene.  There was good flow through the graft. The fourth distal anastomosis was to the distal third of the LAD beyond the previously placed stent in calcium.  The left IMA pedicle was brought through an opening created in the left lateral pericardium, was brought down onto the LAD and sewn end-to-side with running 8-0 Prolene. There was good flow through the anastomosis after briefly releasing the pedicle bulldog on the mammary artery.  The bulldog was reapplied and the mammary pedicle was secured to the epicardium with 6-0 Prolene. Cardioplegia was redosed.  While the crossclamp was still in place, 3 proximal vein anastomoses were performed on the ascending aorta with a 4.5-mm punch running 7-0 Prolene.  Prior to tying down final proximal anastomosis, air was vented from the coronaries with a dose of retrograde warm blood  cardioplegia. The crossclamp was then removed.  The heart resumed a spontaneous rhythm.  The grafts were opened and de- aired.  Each had good flow and hemostasis was documented at the proximal and distal sites.  The patient was rewarmed and reperfused.  Temporary pacing wires were applied.  The lungs were expanded and the ventilator was resumed.  The patient was weaned off bypass without difficulty. Cardiac output and blood pressure were stable.  The echo showed good global LV function.  Protamine was administered without adverse reaction.  The cannulas were removed.  The mediastinum was irrigated with warm saline.  The superior pericardial fat was closed over the aorta.  One anterior mediastinal and 1 left pleural chest tube were placed and brought out through separate incisions.  The sternum was closed with a wire and the pectoralis fascia was closed with a running #1 Vicryl.  Subcutaneous and skin layers were closed with running Vicryl and sterile dressings were applied.  Total cardiopulmonary bypass time was 134 minutes.    Kerin Perna, M.D.  PV/MEDQ  D:  11/16/2012  T:  11/17/2012  Job:  [id number]

## 2012-11-17 NOTE — Progress Notes (Signed)
Utilization review completed.  P.J. Krystall Kruckenberg,RN,BSN Case Manager [phone]  

## 2012-11-18 ENCOUNTER — Inpatient Hospital Stay (HOSPITAL_COMMUNITY): Payer: Medicare Other

## 2012-11-18 LAB — BASIC METABOLIC PANEL
BUN: 22 mg/dL (ref 6–23)
CO2: 22 mEq/L (ref 19–32)
Calcium: 8.2 mg/dL — ABNORMAL LOW (ref 8.4–10.5)
Chloride: 97 mEq/L (ref 96–112)
Creatinine, Ser: 1.29 mg/dL (ref 0.50–1.35)
GFR calc Af Amer: 60 mL/min — ABNORMAL LOW (ref >90)
GFR calc non Af Amer: 51 mL/min — ABNORMAL LOW (ref >90)
Glucose, Bld: 133 mg/dL — ABNORMAL HIGH (ref 70–99)
Potassium: 4 mEq/L (ref 3.5–5.1)
Sodium: 130 mEq/L — ABNORMAL LOW (ref 135–145)

## 2012-11-18 LAB — GLUCOSE, CAPILLARY
Glucose-Capillary: 103 mg/dL — ABNORMAL HIGH (ref 70–99)
Glucose-Capillary: 106 mg/dL — ABNORMAL HIGH (ref 70–99)
Glucose-Capillary: 110 mg/dL — ABNORMAL HIGH (ref 70–99)
Glucose-Capillary: 110 mg/dL — ABNORMAL HIGH (ref 70–99)
Glucose-Capillary: 149 mg/dL — ABNORMAL HIGH (ref 70–99)
Glucose-Capillary: 154 mg/dL — ABNORMAL HIGH (ref 70–99)

## 2012-11-18 LAB — CBC
HCT: 22.3 % — ABNORMAL LOW (ref 39.0–52.0)
Hemoglobin: 8.1 g/dL — ABNORMAL LOW (ref 13.0–17.0)
MCH: 30.7 pg (ref 26.0–34.0)
MCHC: 36.3 g/dL — ABNORMAL HIGH (ref 30.0–36.0)
MCV: 84.5 fL (ref 78.0–100.0)
Platelets: 101 10*3/uL — ABNORMAL LOW (ref 150–400)
RBC: 2.64 MIL/uL — ABNORMAL LOW (ref 4.22–5.81)
RDW: 14.7 % (ref 11.5–15.5)
WBC: 7.3 10*3/uL (ref 4.0–10.5)

## 2012-11-18 MED ORDER — METOCLOPRAMIDE HCL 5 MG/ML IJ SOLN
10.0000 mg | Freq: Four times a day (QID) | INTRAMUSCULAR | Status: AC
  Administered 2012-11-18 – 2012-11-19 (×4): 10 mg via INTRAVENOUS
  Filled 2012-11-18 (×4): qty 2

## 2012-11-18 NOTE — Progress Notes (Signed)
2 Days Post-Op Procedure(s) (LRB): CORONARY ARTERY BYPASS GRAFTING (CABG) (N/A) INTRAOPERATIVE TRANSESOPHAGEAL ECHOCARDIOGRAM (N/A) Subjective: [name]/o "gas and belching"  Objective: Vital signs in last 24 hours: Temp:  [97.6 F (36.4 [name])-99.3 F (37.4 [name])] 98.3 F (36.8 [name]) (12/28 0728) Pulse Rate:  [73-95] 79  (12/28 0400) Cardiac Rhythm:  [-] Sinus tachycardia (12/28 0630) Resp:  [13-28] 23  (12/28 0630) BP: (84-136)/(45-67) 101/56 mmHg (12/28 0630) SpO2:  [80 %-97 %] 92 % (12/28 0400) Arterial Line BP: (96-160)/(35-65) 160/48 mmHg (12/28 0600) Weight:  [205 lb 14.6 oz (93.4 kg)] 205 lb 14.6 oz (93.4 kg) (12/28 0630)  Hemodynamic parameters for last 24 hours: PAP: (25-37)/(10-18) 27/14 mmHg  Intake/Output from previous day: 12/27 0701 - 12/28 0700 In: 1411.2 [P.O.:440; I.V.:667.2; IV Piggyback:304] Out: 1965 [Urine:1565; Chest Tube:400] Intake/Output this shift:    General appearance: alert and no distress Neurologic: intact Heart: regular rate and rhythm Lungs: diminished breath sounds bibasilar Abdomen: mildly distended, nontender  Lab Results:  Basename 11/18/12 0335 11/17/12 1655  WBC 7.3 8.1  HGB 8.1* 9.2*  HCT 22.3* 25.7*  PLT 101* 110*   BMET:  Basename 11/18/12 0335 11/17/12 1655 11/17/12 1651 11/17/12 0425  NA 130* -- 132* --  K 4.0 -- 4.3 --  CL 97 -- 99 --  CO2 22 -- -- 21  GLUCOSE 133* -- 135* --  BUN 22 -- 19 --  CREATININE 1.29 1.19 -- --  CALCIUM 8.2* -- -- 8.0*    PT/INR:  Basename 11/16/12 1408  LABPROT 16.9*  INR 1.41   ABG    Component Value Date/Time   PHART 7.414 11/16/2012 2330   HCO3 22.4 11/16/2012 2330   TCO2 22 11/17/2012 1651   ACIDBASEDEF 2.0 11/16/2012 2330   O2SAT 98.0 11/16/2012 2330   CBG (last 3)   Basename 11/18/12 0726 11/18/12 0416 11/18/12 0015  GLUCAP 110* 103* 154*    Assessment/Plan: S/P Procedure(s) (LRB): CORONARY ARTERY BYPASS GRAFTING (CABG) (N/A) INTRAOPERATIVE TRANSESOPHAGEAL ECHOCARDIOGRAM  (N/A) POD # 2 CABG CV- stable off dopamine, post CABG  RESP- pulmonary hygiene  RENAL- lytes, creatinine OK, continue diuresis  GI- will add reglan for 24 hours  ENDO- CBG well controlled  D/[name] CT  Anemia secondary to ABL- follow for now     LOS: 4 days    Bobby [name] 11/18/2012

## 2012-11-18 NOTE — Progress Notes (Signed)
Gets light headed when trying to ambulate Has been up in chair most of day  BP 125/56  Pulse 92  Temp 98.5 F (36.9 C) (Oral)  Resp 25  Ht 5\' 8"  (1.727 m)  Wt 205 lb 14.6 oz (93.4 kg)  BMI 31.31 kg/m2  SpO2 94%   Intake/Output Summary (Last 24 hours) at 11/18/12 1855 Last data filed at 11/18/12 1800  Gross per 24 hour  Intake 1797.53 ml  Output   1605 ml  Net 192.53 ml   No PM labs

## 2012-11-18 NOTE — Plan of Care (Signed)
Problem: Phase II Progression Outcomes Goal: Tolerates weaning with O2 Sat > 90 Outcome: Progressing 2L n/c o2 Goal: CBGs/Blood glucose < or equal to 120 Outcome: Progressing Lantus daily; requires no additional SSI Goal: Tolerates liquids without nausea/vomiting Outcome: Progressing Very hypoactive bowel sounds; Reglan initiated; Belching; no flatus;  Problem: Phase III Progression Outcomes Goal: Ambulates with pain/dyspnea controlled Outcome: Not Progressing Able to stand and pivot; when attempts to ambulation gaze becomes glassy, c/o lightheaded, near syncope.  MD aware.

## 2012-11-19 ENCOUNTER — Inpatient Hospital Stay (HOSPITAL_COMMUNITY): Payer: Medicare Other

## 2012-11-19 LAB — CBC
Hemoglobin: 6.2 g/dL — CL (ref 13.0–17.0)
MCH: 30.8 pg (ref 26.0–34.0)
MCHC: 35.8 g/dL (ref 30.0–36.0)
MCV: 86.1 fL (ref 78.0–100.0)
RBC: 2.01 MIL/uL — ABNORMAL LOW (ref 4.22–5.81)

## 2012-11-19 LAB — BASIC METABOLIC PANEL
BUN: 24 mg/dL — ABNORMAL HIGH (ref 6–23)
CO2: 24 mEq/L (ref 19–32)
Calcium: 8.3 mg/dL — ABNORMAL LOW (ref 8.4–10.5)
Creatinine, Ser: 1.26 mg/dL (ref 0.50–1.35)
GFR calc non Af Amer: 53 mL/min — ABNORMAL LOW (ref >90)
Glucose, Bld: 110 mg/dL — ABNORMAL HIGH (ref 70–99)
Sodium: 128 mEq/L — ABNORMAL LOW (ref 135–145)

## 2012-11-19 LAB — GLUCOSE, CAPILLARY
Glucose-Capillary: 107 mg/dL — ABNORMAL HIGH (ref 70–99)
Glucose-Capillary: 109 mg/dL — ABNORMAL HIGH (ref 70–99)
Glucose-Capillary: 113 mg/dL — ABNORMAL HIGH (ref 70–99)
Glucose-Capillary: 142 mg/dL — ABNORMAL HIGH (ref 70–99)
Glucose-Capillary: 150 mg/dL — ABNORMAL HIGH (ref 70–99)
Glucose-Capillary: 94 mg/dL (ref 70–99)

## 2012-11-19 LAB — PREPARE RBC (CROSSMATCH)

## 2012-11-19 LAB — TYPE AND SCREEN
ABO/RH(D): A POS
Antibody Screen: NEGATIVE
Unit division: 0
Unit division: 0

## 2012-11-19 MED ORDER — FUROSEMIDE 10 MG/ML IJ SOLN
40.0000 mg | Freq: Once | INTRAMUSCULAR | Status: AC
  Administered 2012-11-19: 40 mg via INTRAVENOUS

## 2012-11-19 MED ORDER — POTASSIUM CHLORIDE CRYS ER 20 MEQ PO TBCR
40.0000 meq | EXTENDED_RELEASE_TABLET | Freq: Two times a day (BID) | ORAL | Status: AC
  Administered 2012-11-19 (×2): 40 meq via ORAL
  Filled 2012-11-19 (×2): qty 2

## 2012-11-19 MED ORDER — BISACODYL 10 MG RE SUPP
10.0000 mg | Freq: Once | RECTAL | Status: AC
  Administered 2012-11-19: 10 mg via RECTAL
  Filled 2012-11-19: qty 1

## 2012-11-19 NOTE — Progress Notes (Signed)
CRITICAL VALUE ALERT  Critical value received:  Hgb 6.2   Date of notification: 11/19/12  Time of notification:  0540  Critical value read back:yes  Nurse who received alert:  Jasper Riling  MD notified (1st page):  Not notified due to previous order on postop order set that states not to call until Hgb <6  Time of first page:  n/a  MD notified (2nd page):  Time of second page:  Responding MD:  n/a  Time MD responded:  n/a

## 2012-11-19 NOTE — Progress Notes (Signed)
Patient ID: Bobby [name], male   DOB: 1932/08/28, 78 y.o.   MRN: [id number] Patient feels much better after transfusion but c/o not being able to reduce his left inguinal hernia.  BP 114/58  Pulse 96  Temp 99.4 F (37.4 C) (Oral)  Resp 19  Ht 5\' 8"  (1.727 m)  Wt 205 lb 4 oz (93.1 kg)  BMI 31.21 kg/m2  SpO2 94%   Intake/Output Summary (Last 24 hours) at 11/19/12 1756 Last data filed at 11/19/12 1720  Gross per 24 hour  Intake   2202 ml  Output   3635 ml  Net  -1433 ml    Diuresing well after transfusion/ lasix  Has bilateral inguinal hernias on exam L>R. Both reduced easily without pain. He will need hernia repair after he recovers

## 2012-11-19 NOTE — Progress Notes (Signed)
3 Days Post-Op Procedure(s) (LRB): CORONARY ARTERY BYPASS GRAFTING (CABG) (N/A) INTRAOPERATIVE TRANSESOPHAGEAL ECHOCARDIOGRAM (N/A) Subjective: Still gets dizzy when changing position Appetite better, no BM or flatus yet   Objective: Vital signs in last 24 hours: Temp:  [98.3 F (36.8 C)-98.5 F (36.9 C)] 98.4 F (36.9 C) (12/29 0739) Pulse Rate:  [71-116] 79  (12/29 0800) Cardiac Rhythm:  [-] Normal sinus rhythm (12/29 0800) Resp:  [10-25] 17  (12/29 0800) BP: (79-129)/(39-73) 126/50 mmHg (12/29 0800) SpO2:  [90 %-100 %] 99 % (12/29 0820) Arterial Line BP: (113)/(45) 113/45 mmHg (12/28 0900) Weight:  [205 lb 4 oz (93.1 kg)] 205 lb 4 oz (93.1 kg) (12/29 0500)  Hemodynamic parameters for last 24 hours:    Intake/Output from previous day: 12/28 0701 - 12/29 0700 In: 2085 [P.O.:1540; I.V.:486; IV Piggyback:59] Out: 1985 [Urine:1975; Chest Tube:10] Intake/Output this shift: Total I/O In: 70 [P.O.:50; I.V.:20] Out: 100 [Urine:100]  General appearance: alert and no distress Neurologic: intact Heart: regular rate and rhythm Lungs: diminished breath sounds bibasilar Abdomen: distended, but + BS  Lab Results:  [facility] 11/19/12 0515 11/18/12 0335  WBC 5.4 7.3  HGB 6.2* 8.1*  HCT 17.3* 22.3*  PLT 96* 101*   BMET:  Basename 11/19/12 0515 11/18/12 0335  NA 128* 130*  K 3.8 4.0  CL 94* 97  CO2 24 22  GLUCOSE 110* 133*  BUN 24* 22  CREATININE 1.26 1.29  CALCIUM 8.3* 8.2*    PT/INR:  Basename 11/16/12 1408  LABPROT 16.9*  INR 1.41   ABG    Component Value Date/Time   PHART 7.414 11/16/2012 2330   HCO3 22.4 11/16/2012 2330   TCO2 22 11/17/2012 1651   ACIDBASEDEF 2.0 11/16/2012 2330   O2SAT 98.0 11/16/2012 2330   CBG (last 3)   Basename 11/19/12 0737 11/19/12 0409 11/19/12 0008  GLUCAP 109* 113* 107*    Assessment/Plan: S/P Procedure(s) (LRB): CORONARY ARTERY BYPASS GRAFTING (CABG) (N/A) INTRAOPERATIVE TRANSESOPHAGEAL ECHOCARDIOGRAM (N/A) -POD # 3  CABG  CV- stable  RESP - continue pulmonary hygiene  RENAL- hyponatremia, still a little volume overloaded  Anemia- has progressed, given his symptoms will transfuse 2 units  CBG well controlled  GI- ileus improving    LOS: 5 days    Madelina Sanda C 11/19/2012

## 2012-11-20 ENCOUNTER — Encounter (HOSPITAL_COMMUNITY): Payer: Self-pay | Admitting: Cardiothoracic Surgery

## 2012-11-20 LAB — GLUCOSE, CAPILLARY
Glucose-Capillary: 109 mg/dL — ABNORMAL HIGH (ref 70–99)
Glucose-Capillary: 113 mg/dL — ABNORMAL HIGH (ref 70–99)
Glucose-Capillary: 193 mg/dL — ABNORMAL HIGH (ref 70–99)
Glucose-Capillary: 86 mg/dL (ref 70–99)
Glucose-Capillary: 95 mg/dL (ref 70–99)
Glucose-Capillary: 99 mg/dL (ref 70–99)

## 2012-11-20 LAB — TYPE AND SCREEN: Unit division: 0

## 2012-11-20 LAB — BASIC METABOLIC PANEL
BUN: 27 mg/dL — ABNORMAL HIGH (ref 6–23)
Chloride: 96 mEq/L (ref 96–112)
GFR calc Af Amer: 63 mL/min — ABNORMAL LOW (ref >90)
GFR calc non Af Amer: 54 mL/min — ABNORMAL LOW (ref >90)
Potassium: 4.1 mEq/L (ref 3.5–5.1)

## 2012-11-20 LAB — CBC
MCHC: 35.5 g/dL (ref 30.0–36.0)
Platelets: 127 10*3/uL — ABNORMAL LOW (ref 150–400)
RDW: 14.9 % (ref 11.5–15.5)
WBC: 5.1 10*3/uL (ref 4.0–10.5)

## 2012-11-20 MED ORDER — INSULIN ASPART 100 UNIT/ML ~~LOC~~ SOLN
0.0000 [IU] | Freq: Three times a day (TID) | SUBCUTANEOUS | Status: DC
  Administered 2012-11-20: 4 [IU] via SUBCUTANEOUS

## 2012-11-20 MED ORDER — GUAIFENESIN ER 600 MG PO TB12
600.0000 mg | ORAL_TABLET | Freq: Two times a day (BID) | ORAL | Status: DC | PRN
  Filled 2012-11-20: qty 1

## 2012-11-20 MED ORDER — SODIUM CHLORIDE 0.9 % IJ SOLN: 3.0000 mL | INTRAMUSCULAR | Status: DC | PRN

## 2012-11-20 MED ORDER — ALPRAZOLAM 0.25 MG PO TABS
0.2500 mg | ORAL_TABLET | Freq: Four times a day (QID) | ORAL | Status: DC | PRN
  Administered 2012-11-20: 0.25 mg via ORAL
  Filled 2012-11-20: qty 1

## 2012-11-20 MED ORDER — SODIUM CHLORIDE 0.9 % IV SOLN: 250.0000 mL | INTRAVENOUS | Status: DC | PRN

## 2012-11-20 MED ORDER — MAGNESIUM HYDROXIDE 400 MG/5ML PO SUSP: 30.0000 mL | Freq: Every day | ORAL | Status: DC | PRN

## 2012-11-20 MED ORDER — DIPHENHYDRAMINE HCL 25 MG PO CAPS: 25.0000 mg | ORAL_CAPSULE | Freq: Every evening | ORAL | Status: DC | PRN

## 2012-11-20 MED ORDER — SODIUM CHLORIDE 0.9 % IJ SOLN
3.0000 mL | Freq: Two times a day (BID) | INTRAMUSCULAR | Status: DC
  Administered 2012-11-20 (×2): 3 mL via INTRAVENOUS

## 2012-11-20 MED ORDER — MOVING RIGHT ALONG BOOK
Freq: Once | Status: AC
  Administered 2012-11-20: 16:00:00
  Filled 2012-11-20 (×2): qty 1

## 2012-11-20 MED ORDER — POTASSIUM CHLORIDE CRYS ER 20 MEQ PO TBCR
20.0000 meq | EXTENDED_RELEASE_TABLET | Freq: Two times a day (BID) | ORAL | Status: DC
  Administered 2012-11-20 – 2012-11-21 (×3): 20 meq via ORAL
  Filled 2012-11-20 (×4): qty 1

## 2012-11-20 MED ORDER — FUROSEMIDE 40 MG PO TABS
40.0000 mg | ORAL_TABLET | Freq: Every day | ORAL | Status: DC
  Administered 2012-11-20 – 2012-11-21 (×2): 40 mg via ORAL
  Filled 2012-11-20 (×2): qty 1

## 2012-11-20 NOTE — Plan of Care (Signed)
Problem: Phase III Progression Outcomes Goal: Time patient transferred to PCTU/Telemetry POD Outcome: Completed/Met Date Met:  11/20/12 1114

## 2012-11-20 NOTE — Progress Notes (Signed)
CARDIAC REHAB PHASE I   PRE:  Rate/Rhythm: 90SR  BP:  Supine:   Sitting: 139/59  Standing:    SaO2: 94%1L  MODE:  Ambulation: 350 ft   POST:  Rate/Rhythem: 124  BP:  Supine:   Sitting: 130/76  Standing:    SaO2: 98%1L 1105-1137 Pt walked 350 ft on 1L with rolling walker and asst  X2. Gait steady. Tolerated well. Did not c/o dizziness. To recliner after walk. Can be asst x 1.  Call bell in reach.   Bobby [name]

## 2012-11-20 NOTE — Progress Notes (Signed)
tranferred to 2018 via wheelchair with monitor and O2 at 1L/ min [place]. Report given to Dois Davenport, RN on 2000 prior to transfer. On cardiac surgery pathway. Tolerated transfer well. Park and walk completed. Family notified of transfer and updated on condition.

## 2012-11-20 NOTE — Progress Notes (Addendum)
4 Days Post-Op Procedure(s) (LRB): CORONARY ARTERY BYPASS GRAFTING (CABG) (N/A) INTRAOPERATIVE TRANSESOPHAGEAL ECHOCARDIOGRAM (N/A) Subjective: Feels better after transfusion yesterday Ambulated around unit yesterday C/o pain right big toe  Objective: Vital signs in last 24 hours: Temp:  [97.9 F (36.6 C)-99.4 F (37.4 C)] 98.6 F (37 C) (12/30 0400) Pulse Rate:  [76-104] 93  (12/30 0630) Cardiac Rhythm:  [-] Normal sinus rhythm (12/30 0400) Resp:  [12-25] 21  (12/30 0630) BP: (95-127)/(46-88) 103/56 mmHg (12/30 0600) SpO2:  [87 %-99 %] 91 % (12/30 0630) Weight:  [204 lb 2.3 oz (92.6 kg)] 204 lb 2.3 oz (92.6 kg) (12/30 0500)  Hemodynamic parameters for last 24 hours:    Intake/Output from previous day: 12/29 0701 - 12/30 0700 In: 1454 [P.O.:710; I.V.:40; Blood:700; IV Piggyback:4] Out: 3760 [Urine:3760] Intake/Output this shift:    General appearance: alert and no distress Neurologic: intact Heart: regular rate and rhythm Lungs: diminished breath sounds bibasilar slight warmth right big toe, nontender  Lab Results:  Basename 11/20/12 0450 11/19/12 0515  WBC 5.1 5.4  HGB 7.7* 6.2*  HCT 21.7* 17.3*  PLT 127* 96*   BMET:  Basename 11/20/12 0450 11/19/12 0515  NA 130* 128*  K 4.1 3.8  CL 96 94*  CO2 24 24  GLUCOSE 94 110*  BUN 27* 24*  CREATININE 1.23 1.26  CALCIUM 8.6 8.3*    PT/INR: No results found for this basename: LABPROT,INR in the last 72 hours ABG    Component Value Date/Time   PHART 7.414 11/16/2012 2330   HCO3 22.4 11/16/2012 2330   TCO2 22 11/17/2012 1651   ACIDBASEDEF 2.0 11/16/2012 2330   O2SAT 98.0 11/16/2012 2330   CBG (last 3)   Basename 11/20/12 0424 11/20/12 0032 11/19/12 2011  GLUCAP 86 95 150*    Assessment/Plan: S/P Procedure(s) (LRB): CORONARY ARTERY BYPASS GRAFTING (CABG) (N/A) INTRAOPERATIVE TRANSESOPHAGEAL ECHOCARDIOGRAM (N/A) Plan for transfer to step-down: see transfer orders CV- stable, maintaining SR  RESP-  pulmonary hygiene  RENAL- lytes, creatinine OK  Painful great toe- check uric acid  Anemia- secondary to ABL- improved after transfusion  Vertigo- improved after transfusion   LOS: 6 days    Dawsyn Zurn C 11/20/2012   CBG well controlled

## 2012-11-21 ENCOUNTER — Inpatient Hospital Stay (HOSPITAL_COMMUNITY): Payer: Medicare Other

## 2012-11-21 DIAGNOSIS — Z9861 Coronary angioplasty status: Secondary | ICD-10-CM | POA: Insufficient documentation

## 2012-11-21 DIAGNOSIS — Z9889 Other specified postprocedural states: Secondary | ICD-10-CM

## 2012-11-21 DIAGNOSIS — Z955 Presence of coronary angioplasty implant and graft: Secondary | ICD-10-CM

## 2012-11-21 DIAGNOSIS — I2510 Atherosclerotic heart disease of native coronary artery without angina pectoris: Secondary | ICD-10-CM

## 2012-11-21 DIAGNOSIS — Z951 Presence of aortocoronary bypass graft: Secondary | ICD-10-CM

## 2012-11-21 LAB — BASIC METABOLIC PANEL
BUN: 33 mg/dL — ABNORMAL HIGH (ref 6–23)
CO2: 24 mEq/L (ref 19–32)
Calcium: 9.1 mg/dL (ref 8.4–10.5)
Creatinine, Ser: 1.24 mg/dL (ref 0.50–1.35)
GFR calc non Af Amer: 54 mL/min — ABNORMAL LOW (ref >90)
Glucose, Bld: 94 mg/dL (ref 70–99)

## 2012-11-21 LAB — CBC
HCT: 26.2 % — ABNORMAL LOW (ref 39.0–52.0)
Hemoglobin: 9.2 g/dL — ABNORMAL LOW (ref 13.0–17.0)
MCH: 30.9 pg (ref 26.0–34.0)
MCHC: 35.1 g/dL (ref 30.0–36.0)
MCV: 87.9 fL (ref 78.0–100.0)
RBC: 2.98 MIL/uL — ABNORMAL LOW (ref 4.22–5.81)

## 2012-11-21 LAB — GLUCOSE, CAPILLARY
Glucose-Capillary: 105 mg/dL — ABNORMAL HIGH (ref 70–99)
Glucose-Capillary: 99 mg/dL (ref 70–99)

## 2012-11-21 MED ORDER — BUDESONIDE-FORMOTEROL FUMARATE 160-4.5 MCG/ACT IN AERO: 2.0000 | INHALATION_SPRAY | Freq: Two times a day (BID) | RESPIRATORY_TRACT | Status: DC

## 2012-11-21 MED ORDER — METOPROLOL TARTRATE 25 MG PO TABS: 12.5000 mg | ORAL_TABLET | Freq: Two times a day (BID) | ORAL | Status: AC

## 2012-11-21 MED ORDER — HYDROCODONE-ACETAMINOPHEN 5-325 MG PO TABS: 1.0000 | ORAL_TABLET | ORAL | Status: DC | PRN

## 2012-11-21 MED ORDER — SIMVASTATIN 10 MG PO TABS: 10.0000 mg | ORAL_TABLET | Freq: Every day | ORAL | Status: DC

## 2012-11-21 MED ORDER — POTASSIUM CHLORIDE CRYS ER 20 MEQ PO TBCR: 20.0000 meq | EXTENDED_RELEASE_TABLET | Freq: Two times a day (BID) | ORAL | Status: DC

## 2012-11-21 MED ORDER — FE FUMARATE-B12-VIT C-FA-IFC PO CAPS: 1.0000 | ORAL_CAPSULE | Freq: Two times a day (BID) | ORAL | Status: DC

## 2012-11-21 MED ORDER — FUROSEMIDE 40 MG PO TABS: 40.0000 mg | ORAL_TABLET | Freq: Every day | ORAL | Status: DC

## 2012-11-21 MED FILL — Heparin Sodium (Porcine) Inj 1000 Unit/ML: INTRAMUSCULAR | Qty: 30 | Status: AC

## 2012-11-21 MED FILL — Lidocaine HCl IV Inj 20 MG/ML: INTRAVENOUS | Qty: 5 | Status: AC

## 2012-11-21 MED FILL — Mannitol IV Soln 20%: INTRAVENOUS | Qty: 500 | Status: AC

## 2012-11-21 MED FILL — Sodium Chloride IV Soln 0.9%: INTRAVENOUS | Qty: 1000 | Status: AC

## 2012-11-21 MED FILL — Heparin Sodium (Porcine) Inj 1000 Unit/ML: INTRAMUSCULAR | Qty: 10 | Status: AC

## 2012-11-21 MED FILL — Sodium Chloride Irrigation Soln 0.9%: Qty: 3000 | Status: AC

## 2012-11-21 MED FILL — Electrolyte-R (PH 7.4) Solution: INTRAVENOUS | Qty: 5000 | Status: AC

## 2012-11-21 MED FILL — Sodium Bicarbonate IV Soln 8.4%: INTRAVENOUS | Qty: 50 | Status: AC

## 2012-11-21 NOTE — Progress Notes (Signed)
5 Days Post-Op Procedure(s) (LRB): CORONARY ARTERY BYPASS GRAFTING (CABG) (N/A) INTRAOPERATIVE TRANSESOPHAGEAL ECHOCARDIOGRAM (N/A) Subjective:  Mr. Colvin has no complaints this morning. He is up in a chair dressed and ready to go home.  He is ambulating, +BM  Objective: Vital signs in last 24 hours: Temp:  [97.8 F (36.6 C)-99 F (37.2 C)] 98.2 F (36.8 C) (12/31 0437) Pulse Rate:  [78-102] 78  (12/31 0437) Cardiac Rhythm:  [-] Normal sinus rhythm (12/31 0730) Resp:  [17-23] 18  (12/31 0437) BP: (102-139)/(50-67) 139/65 mmHg (12/31 0437) SpO2:  [91 %-99 %] 94 % (12/31 0838) Weight:  [199 lb 8.3 oz (90.5 kg)] 199 lb 8.3 oz (90.5 kg) (12/31 0437)    Intake/Output from previous day: 12/30 0701 - 12/31 0700 In: 1043 [P.O.:1040; I.V.:3] Out: 1625 [Urine:1625]  General appearance: alert, cooperative and no distress Neurologic: intact Heart: regular rate and rhythm Lungs: clear to auscultation bilaterally Abdomen: soft, non-tender; bowel sounds normal; no masses,  no organomegaly Extremities: edema 1-2+ Wound: clean and dry  Lab Results:  [facility] 11/21/12 0520 11/20/12 0450  WBC 6.3 5.1  HGB 9.2* 7.7*  HCT 26.2* 21.7*  PLT 197 127*   BMET:  Basename 11/21/12 0520 11/20/12 0450  NA 131* 130*  K 4.4 4.1  CL 97 96  CO2 24 24  GLUCOSE 94 94  BUN 33* 27*  CREATININE 1.24 1.23  CALCIUM 9.1 8.6    PT/INR: No results found for this basename: LABPROT,INR in the last 72 hours ABG    Component Value Date/Time   PHART 7.414 11/16/2012 2330   HCO3 22.4 11/16/2012 2330   TCO2 22 11/17/2012 1651   ACIDBASEDEF 2.0 11/16/2012 2330   O2SAT 98.0 11/16/2012 2330   CBG (last 3)   Basename 11/21/12 0631 11/20/12 2057 11/20/12 1641  GLUCAP 105* 113* 109*    Assessment/Plan: S/P Procedure(s) (LRB): CORONARY ARTERY BYPASS GRAFTING (CABG) (N/A) INTRAOPERATIVE TRANSESOPHAGEAL ECHOCARDIOGRAM (N/A)  1. CV-  NSR on Lopressor 2. Pulm- no acute issues, continue IS at  discharge 3. Acute post operative anemia stable- H/H 9.2 4. Volume Overload- weight remains elevaed 5. Dispo- patient doing well, will d/c EPW, pending no arrythmia develops will d/c home this afternoon per PVT   LOS: 7 days    Raford Pitcher, Davonne Baby 11/21/2012

## 2012-11-21 NOTE — Progress Notes (Signed)
CARDIAC REHAB PHASE I   PRE:  Rate/Rhythm: 79SR  BP:  Supine:   Sitting: 134/70  Standing:    SaO2: 93%RA  MODE:  Ambulation: 550 ft   POST:  Rate/Rhythem: 105ST  BP:  Supine:   Sitting: 142/71  Standing:    SaO2: 93%RA 0922-1020 Pt walked 550 ft with rolling walker and minimal asst. Gait steady. Tolerated well. Has rolling walker at home. Education completed with pt and son. Permission given to send letter to [place] to notify that pt wants to do CRP 2. Pt will discuss with his cardiologist in [place]. To chair after walk.  Duanne Limerick

## 2012-11-21 NOTE — Discharge Summary (Signed)
Physician Discharge Summary  Patient ID: Bobby [name] MRN: [id number] DOB/AGE: Jan 23, 1932 78 y.o.  Admit date: 11/14/2012 Discharge date: 11/21/2012  Admission Diagnoses:  Patient Active Problem List  Diagnosis  . Coronary artery disease  . Carotid artery disease  . S/P carotid endarterectomy  . Stented coronary artery    Discharge Diagnoses:   Patient Active Problem List  Diagnosis  . Coronary artery disease  . Carotid artery disease  . S/P carotid endarterectomy  . Stented coronary artery   Discharged Condition: good  History of Present Illness:   Bobby [name] is a 78 yo white male with known history of CAD S/P Stent placement done in 2001 and 2006 performed at an OSH.  He also has a known history of Carotid Artery Stenosis S/P Endarterectomy done at [facility] approximately 5 years ago.  The patient presented to an OSH ED with a complaint of sudden onset of epigastric discomfort after eating that occurred on Saturday 11/11/2012.  This pain was also associated with shortness of breath and diaphoresis.  Workup in the ED confirmed STEMI leading to transfer to [facility] for further evaluation.  He underwent cardiac catheterization which showed severe coronary disease and a preserved EF.  He was chest pain free after admission and was placed on Heparin and Beta Blocker therapy.  He was subsequently transferred to [facility] for possible coronary bypass procedure.     Hospital Course:   Upon arrival patient remained chest pain free.  He was evaluated by Dr. Donata Clay who explained the risks and benefits of Coronary Bypass Procedure.  The patient was agreeable to proceed.  He was taken to the operating room on 11/16/2012.  He underwent CABG x 4 utilizing LIMA to LAD, SVG to Diagonal Branch, SVG to OM, and SVG to RCA.  He also underwent endoscopic saphenous vein harvest of right and left thigh.  The patient tolerated the procedure well and was taken to the SICU in  stable condition.  The patient was extubated the evening off surgery.  While in the ICU the patient was weaned off his Cardiac pressors, his chest tubes and arterial lines were removed.  Patient developed dizziness with further progression of post operative anemia and was transfused 2 units of packed red cells.  Once the patient was medically stable he was transferred to the step down unit for further convalescence.  He has done well since transfer.  He is ambulating without difficulty and tolerating his diet.  His pacing wires will be removed and pending no arrythmia develops we will plan to discharge the patient home later this afternoon.  Of note on exam the patient does have bilateral inguinal hernias that will require surgical evaluation once recovered from bypass surgery.  He is scheduled to follow up with Dr. Donata Clay on 12/13/2012 with a chest xray prior to his appointment.  He will need to contact his primary Cardiologist for further follow up.     Treatments: surgery:   Coronary artery bypass grafting x4 (left internal mammary artery to  left anterior descending, saphenous vein graft to diagonal,  saphenous vein graft to obtuse marginal, saphenous vein graft to  right coronary artery).   2. Endoscopic harvest of bilateral greater saphenous vein   Disposition: Home  The patient has been discharged on:   1.Beta Blocker:  Yes [ x  ]  No   [   ]                              If No, reason:  2.Ace Inhibitor/ARB: Yes [   ]                                     No  [   x ]                                     If No, reason: elevated creatinine, can reassess at post operative visit  3.Statin:   Yes [ x  ]                  No  [   ]                  If No, reason:  4.Ecasa:  Yes  [ x  ]                  No   [   ]                  If No, reason:     Discharge Orders    Future Appointments: Provider: Department: Dept Phone: Center:   12/13/2012 3:00 PM  Kerin Perna, MD [facility] [phone] [id number]       Medication List     As of 11/21/2012  9:53 AM    TAKE these medications         aspirin 325 MG tablet   Take 325 mg by mouth every 6 (six) hours as needed. For pain      budesonide-formoterol 160-4.5 MCG/ACT inhaler   Commonly known as: SYMBICORT   Inhale 2 puffs into the lungs 2 (two) times daily.      cholecalciferol 1000 UNITS tablet   Commonly known as: VITAMIN D   Take 1,000 Units by mouth daily.      ferrous fumarate-b12-vitamic C-folic acid capsule   Commonly known as: TRINSICON / FOLTRIN   Take 1 capsule by mouth 2 (two) times daily with a meal.      furosemide 40 MG tablet   Commonly known as: LASIX   Take 1 tablet (40 mg total) by mouth daily. For 7 days      HYDROcodone-acetaminophen 5-325 MG per tablet   Commonly known as: NORCO/VICODIN   Take 1-2 tablets by mouth every 4 (four) hours as needed.      metoprolol tartrate 25 MG tablet   Commonly known as: LOPRESSOR   Take 0.5 tablets (12.5 mg total) by mouth 2 (two) times daily.      multivitamin with minerals Tabs   Take 1 tablet by mouth daily.      potassium chloride SA 20 MEQ tablet   Commonly known as: K-DUR,KLOR-CON   Take 1 tablet (20 mEq total) by mouth 2 (two) times daily. For 7 days      simvastatin 10 MG tablet   Commonly known as: ZOCOR   Take 1 tablet (10 mg total) by mouth at bedtime.      vitamin C 500 MG tablet   Commonly known as: ASCORBIC ACID   Take 500 mg by mouth  daily.           Follow-up Information    Follow up with VAN Dinah Beers, MD. On 12/13/2012. (Appointment is at 3:00pm)    Contact information:   [address] [employer] [address] [place] [address] [phone]       Follow up with [facility]. On 12/13/2012. (Please get chest xray 1 hour prior to appointment)    Contact information:   [place]       Follow up with Cardiologist. In 2 weeks. (Please contact  office and set up appointment for 2-4 weeks)          Signed: Lowella Dandy 11/21/2012, 9:53 AM

## 2012-11-21 NOTE — Progress Notes (Addendum)
Given pt and family discharge instructions and prescriptions, will discharge home as ordered.pt bp 129/63 heart rate 73 Tata Timmins, [employer] rN

## 2012-11-21 NOTE — Progress Notes (Signed)
Pt EPW pulled per protocal and as ordered all ends intact pt  bp 159/66 pt reminded to lie supine approximately one hour. Ct sutures removed and steri strips applied. Will continue tomonitor patient. Delene Morais, Randall An RN

## 2012-11-22 NOTE — Discharge Summary (Signed)
patient examined and medical record reviewed,agree with above note. VAN TRIGT III,PETER 11/22/2012   

## 2012-11-24 ENCOUNTER — Encounter: Payer: Self-pay | Admitting: Cardiothoracic Surgery

## 2012-11-26 ENCOUNTER — Emergency Department: Payer: Self-pay | Admitting: Emergency Medicine

## 2012-11-26 LAB — PROTIME-INR: INR: 1

## 2012-11-26 LAB — CBC
HCT: 31.6 % — ABNORMAL LOW (ref 40.0–52.0)
MCV: 92 fL (ref 80–100)
Platelet: 506 10*3/uL — ABNORMAL HIGH (ref 150–440)
RBC: 3.43 10*6/uL — ABNORMAL LOW (ref 4.40–5.90)
RDW: 14.7 % — ABNORMAL HIGH (ref 11.5–14.5)
WBC: 7.3 10*3/uL (ref 3.8–10.6)

## 2012-11-26 LAB — URINALYSIS, COMPLETE
Bilirubin,UR: NEGATIVE
Glucose,UR: NEGATIVE mg/dL (ref 0–75)
Leukocyte Esterase: NEGATIVE
Nitrite: NEGATIVE
Ph: 7 (ref 4.5–8.0)
Squamous Epithelial: NONE SEEN
WBC UR: <1 /HPF (ref 0–5)

## 2012-11-26 LAB — COMPREHENSIVE METABOLIC PANEL
Bilirubin,Total: 1.2 mg/dL — ABNORMAL HIGH (ref 0.2–1.0)
Chloride: 100 mmol/L (ref 98–107)
Co2: 24 mmol/L (ref 21–32)
Creatinine: 1.33 mg/dL — ABNORMAL HIGH (ref 0.60–1.30)
EGFR (African American): 59 — ABNORMAL LOW
EGFR (Non-African Amer.): 51 — ABNORMAL LOW
Glucose: 100 mg/dL — ABNORMAL HIGH (ref 65–99)
Osmolality: 275 (ref 275–301)
Potassium: 4.6 mmol/L (ref 3.5–5.1)
Total Protein: 8.2 g/dL (ref 6.4–8.2)

## 2012-11-26 LAB — CK TOTAL AND CKMB (NOT AT ARMC): CK-MB: 0.6 ng/mL (ref 0.5–3.6)

## 2012-11-26 LAB — TROPONIN I: Troponin-I: 0.36 ng/mL — ABNORMAL HIGH

## 2012-11-27 ENCOUNTER — Telehealth: Payer: Self-pay | Admitting: *Deleted

## 2012-11-27 NOTE — Telephone Encounter (Signed)
MR. Aird' son called to let us know that his father was seen in there ER ar [facility] over the weekend for his leg.  Studies were done and a clot was r/o.  He was given antibiotics for an infection in his leg.  I told him I would document this call.

## 2012-12-06 ENCOUNTER — Ambulatory Visit
Admission: RE | Admit: 2012-12-06 | Discharge: 2012-12-06 | Disposition: A | Discharge status: Home / Self Care | Payer: Medicare Other | Source: Ambulatory Visit | Attending: Cardiothoracic Surgery | Admitting: Cardiothoracic Surgery

## 2012-12-06 ENCOUNTER — Encounter: Payer: Self-pay | Admitting: Cardiothoracic Surgery

## 2012-12-06 ENCOUNTER — Ambulatory Visit (INDEPENDENT_AMBULATORY_CARE_PROVIDER_SITE_OTHER): Payer: Self-pay | Admitting: Cardiothoracic Surgery

## 2012-12-06 ENCOUNTER — Other Ambulatory Visit: Payer: Self-pay | Admitting: *Deleted

## 2012-12-06 VITALS — BP 109/66 | HR 92 | Temp 99.1°F | Resp 20 | Ht 68.0 in | Wt 172.0 lb

## 2012-12-06 DIAGNOSIS — L03115 Cellulitis of right lower limb: Secondary | ICD-10-CM

## 2012-12-06 DIAGNOSIS — L03119 Cellulitis of unspecified part of limb: Secondary | ICD-10-CM

## 2012-12-06 DIAGNOSIS — Z951 Presence of aortocoronary bypass graft: Secondary | ICD-10-CM

## 2012-12-06 DIAGNOSIS — I2510 Atherosclerotic heart disease of native coronary artery without angina pectoris: Secondary | ICD-10-CM

## 2012-12-06 DIAGNOSIS — Z09 Encounter for follow-up examination after completed treatment for conditions other than malignant neoplasm: Secondary | ICD-10-CM

## 2012-12-06 DIAGNOSIS — L02419 Cutaneous abscess of limb, unspecified: Secondary | ICD-10-CM

## 2012-12-06 NOTE — Progress Notes (Signed)
PCP is Virgel Manifold, MD Referring Provider is Virgel Manifold, MD  Chief Complaint  Patient presents with  . Routine Post Op    F/U from surgery with CXR, CABG x 4 on 11/16/12  . Cellulitis    Right leg, currently taking Augmentin 500 bid    HPI: 3 week followup after multivessel coronary bypass grafting for unstable angina and three-vessel disease Postoperative problems included pulmonary atelectasis with oxygen requirement, deconditioning.  Since returning home the patient has had a poor appetite, he has developed some redness and swelling of his right lower extremity around the vein harvest site felt to represent cellulitis by his primary care physician who started him on oral antibiotics-initially Septra and no change to amoxicillin because of nausea with the Septra. There is been no fever. He is maintained sinus rhythm. There is concern over his oxygen levels overnight and home oxygen monitor was ordered which documented oxygen saturation fell to 80% at 1 point mainly remaining between 88 and 95%. Home oxygen was ordered but not processed by the home health agency due to lack of a medical diagnosis. The patient Dr. saturation at rest is 98%. His chest x-ray shows no significant pleural effusion. The family states he has been confused some but also has had poor short-term memory following discharge from the hospital.  Labs were checked by his primary care physician 40 hours ago showing a hematocrit of 31 white count 4.2 platelet count 472 glucose 99 potassium 5.2 creatinine 1.6 BUN 19 sodium 127. Apparently a ultrasound of the right leg was performed to rule out DVT according to family   Past Medical History  Diagnosis Date  . PONV (postoperative nausea and vomiting)   . Coronary artery disease     2 stents  . Myocardial infarction     NSTEMI 11/11/12 @ [facility]  . Peripheral vascular disease     (L)CEA 2007 [place]  . GERD (gastroesophageal reflux disease)   . Arthritis     (R)TKR 5/13  [facility]    Past Surgical History  Procedure Date  . Vascular surgery     (R)TKR 5/13 [facility]  . Cardiac catheterization     11/11/12 [facility],2000,2006 w/ stents  . Joint replacement     (R)TKR 5/13 [facility]  . Coronary artery bypass graft 11/16/2012    Procedure: CORONARY ARTERY BYPASS GRAFTING (CABG);  Surgeon: Kerin Perna, MD;  Location: [facility] OR;  Service: Open Heart Surgery;  Laterality: N/A;  times four using Bilateral Greater Saphenous Vein Graft harvested endoscopically from bilateral thighs, and Left Internal Mammary Artery.  . Intraoperative transesophageal echocardiogram 11/16/2012    Procedure: INTRAOPERATIVE TRANSESOPHAGEAL ECHOCARDIOGRAM;  Surgeon: Kerin Perna, MD;  Location: [facility] OR;  Service: Open Heart Surgery;  Laterality: N/A;    No family history on file.  Social History History  Substance Use Topics  . Smoking status: Former Smoker    Types: Cigars    Quit date: 10/22/1989  . Smokeless tobacco: Never Used  . Alcohol Use: No    Current Outpatient Prescriptions  Medication Sig Dispense Refill  . amoxicillin-clavulanate (AUGMENTIN) 500-125 MG per tablet Take 1 tablet by mouth 2 (two) times daily. Rt leg cellulitis      . aspirin 325 MG tablet Take 325 mg by mouth every 6 (six) hours as needed. For pain      . budesonide-formoterol (SYMBICORT) 160-4.5 MCG/ACT inhaler Inhale 2 puffs into the lungs 2 (two) times daily.  1 Inhaler  1  . cholecalciferol (VITAMIN D)  1000 UNITS tablet Take 1,000 Units by mouth daily.      . metoprolol tartrate (LOPRESSOR) 25 MG tablet Take 0.5 tablets (12.5 mg total) by mouth 2 (two) times daily.  30 tablet  1  . Multiple Vitamin (MULTIVITAMIN WITH MINERALS) TABS Take 1 tablet by mouth daily.      Marland Kitchen omeprazole (PRILOSEC) 20 MG capsule Take 20 mg by mouth daily.       . polyethylene glycol (MIRALAX / GLYCOLAX) packet Take 17 g by mouth daily.        Allergies  Allergen Reactions  . Tramadol Hcl Other (See Comments)    Hallucinations     Review of Systems no fevers poor sleeping poor energy, family is frustrated with the patient's slow progress  BP 109/66  Pulse 92  Temp 99.1 F (37.3 C)  Resp 20  Ht 5\' 8"  (1.727 m)  Wt 172 lb (78.019 kg)  BMI 26.15 kg/m2  SpO2 98% Physical Exam Patient no distress Lungs clear Sternum well-healed Cardiac rhythm regular Abdomen soft Right leg with ecchymoses some edema and some erythema over the medial right calf without warmth or tenderness, no fluctuance no drainage Gait normal slight limp of the right leg Diagnostic Tests: Chest x-ray clear with minimal basilar atelectasis sternal wires intact  Impression: Slow progress after multivessel bypass grafting problems with ambulation, appetite, short-term memory, insomnia Plan: Reviewing the patient x-rays laboratory data and history from family I reassured them that the patient is experiencing fairly common problems after urgent bypass surgery. We will try to continue physical therapy for another week. We will proceed with a pulmonary evaluation to try to get the patient home oxygen if felt needed. His FEV1 is from spirometry before surgery was approximately 50% of predicted and a diffusion capacity was not performed. He will begin short twice a day. We will liberalize his sodium intake due to a reduced serum sodium 127 The patient return for further assessment in 2 weeks

## 2012-12-13 ENCOUNTER — Ambulatory Visit: Payer: Medicare Other | Admitting: Cardiothoracic Surgery

## 2012-12-15 ENCOUNTER — Other Ambulatory Visit: Payer: Self-pay | Admitting: *Deleted

## 2012-12-15 DIAGNOSIS — Z951 Presence of aortocoronary bypass graft: Secondary | ICD-10-CM

## 2012-12-15 DIAGNOSIS — I2510 Atherosclerotic heart disease of native coronary artery without angina pectoris: Secondary | ICD-10-CM

## 2012-12-20 ENCOUNTER — Ambulatory Visit
Admission: RE | Admit: 2012-12-20 | Discharge: 2012-12-20 | Disposition: A | Discharge status: Home / Self Care | Payer: Medicare Other | Source: Ambulatory Visit | Attending: Cardiothoracic Surgery | Admitting: Cardiothoracic Surgery

## 2012-12-20 ENCOUNTER — Ambulatory Visit (INDEPENDENT_AMBULATORY_CARE_PROVIDER_SITE_OTHER): Payer: Self-pay | Admitting: Cardiothoracic Surgery

## 2012-12-20 ENCOUNTER — Encounter: Payer: Self-pay | Admitting: Cardiothoracic Surgery

## 2012-12-20 VITALS — BP 116/75 | HR 78 | Resp 20 | Ht 68.0 in | Wt 184.0 lb

## 2012-12-20 DIAGNOSIS — Z951 Presence of aortocoronary bypass graft: Secondary | ICD-10-CM

## 2012-12-20 DIAGNOSIS — L02419 Cutaneous abscess of limb, unspecified: Secondary | ICD-10-CM

## 2012-12-20 DIAGNOSIS — I2510 Atherosclerotic heart disease of native coronary artery without angina pectoris: Secondary | ICD-10-CM

## 2012-12-20 DIAGNOSIS — L03119 Cellulitis of unspecified part of limb: Secondary | ICD-10-CM

## 2012-12-20 DIAGNOSIS — Z09 Encounter for follow-up examination after completed treatment for conditions other than malignant neoplasm: Secondary | ICD-10-CM

## 2012-12-20 DIAGNOSIS — K4030 Unilateral inguinal hernia, with obstruction, without gangrene, not specified as recurrent: Secondary | ICD-10-CM

## 2012-12-20 NOTE — Progress Notes (Signed)
PCP is Virgel Manifold, MD Referring Provider is Virgel Manifold, MD  Chief Complaint  Patient presents with  . Routine Post Op    2 week f/u with CXR, recheck right leg cellulitis, S/P CABG x 4 on 11/16/12     HPI: Patient returns one month followup after CABG for severe three-vessel CAD He is maintained sinus rhythm. The surgical incisions are healed well. He still has some bruising in his legs. He is ready to start outpatient cardiac rehabilitation at [facility]. He is walking around the house. He has had no significant complaints. While he was in the hospital recovering from surgery he had a left inguinal hernia become incarcerated which was reduced by general surgeon. He wishes to make arrangements with his general surgeon in [place] to have the inguinal hernia repair which can be done in the next 2-4 weeks with respect to his cardiac status. He should wait a month for elective dental extraction. While in the hospital the patient had some desaturation with oxygen at night but he is unable to obtain home oxygen due to the cost. His oxygen saturation in the clinic today  is normal on room air   Past Medical History  Diagnosis Date  . PONV (postoperative nausea and vomiting)   . Coronary artery disease     2 stents  . Myocardial infarction     NSTEMI 11/11/12 @ [facility]  . Peripheral vascular disease     (L)CEA 2007 [place]  . GERD (gastroesophageal reflux disease)   . Arthritis     (R)TKR 5/13 [facility]    Past Surgical History  Procedure Date  . Vascular surgery     (R)TKR 5/13 [facility]  . Cardiac catheterization     11/11/12 [facility],2000,2006 w/ stents  . Joint replacement     (R)TKR 5/13 [facility]  . Coronary artery bypass graft 11/16/2012    Procedure: CORONARY ARTERY BYPASS GRAFTING (CABG);  Surgeon: Kerin Perna, MD;  Location: [facility] OR;  Service: Open Heart Surgery;  Laterality: N/A;  times four using Bilateral Greater Saphenous Vein Graft harvested endoscopically from bilateral  thighs, and Left Internal Mammary Artery.  . Intraoperative transesophageal echocardiogram 11/16/2012    Procedure: INTRAOPERATIVE TRANSESOPHAGEAL ECHOCARDIOGRAM;  Surgeon: Kerin Perna, MD;  Location[facility] OR;  Service: Open Heart Surgery;  Laterality: N/A;    No family history on file.  Social History History  Substance Use Topics  . Smoking status: Former Smoker    Types: Cigars    Quit date: 10/22/1989  . Smokeless tobacco: Never Used  . Alcohol Use: No    Current Outpatient Prescriptions  Medication Sig Dispense Refill  . aspirin 325 MG tablet Take 325 mg by mouth every 6 (six) hours as needed. For pain      . bisacodyl (DULCOLAX) 5 MG EC tablet Take 5 mg by mouth daily as needed.      . budesonide-formoterol (SYMBICORT) 160-4.5 MCG/ACT inhaler Inhale 2 puffs into the lungs 2 (two) times daily.  1 Inhaler  1  . cholecalciferol (VITAMIN D) 1000 UNITS tablet Take 1,000 Units by mouth daily.      . metoprolol tartrate (LOPRESSOR) 25 MG tablet Take 0.5 tablets (12.5 mg total) by mouth 2 (two) times daily.  30 tablet  1  . Multiple Vitamin (MULTIVITAMIN WITH MINERALS) TABS Take 1 tablet by mouth daily.      Marland Kitchen omeprazole (PRILOSEC) 20 MG capsule Take 20 mg by mouth daily.         Allergies  Allergen Reactions  . Tramadol Hcl Other (See Comments)    Hallucinations    Review of Systems no fever improved appetite and strength  BP 116/75  Pulse 78  Resp 20  Ht 5\' 8"  (1.727 m)  Wt 184 lb (83.462 kg)  BMI 27.98 kg/m2  SpO2 95% Physical Exam Alert and comfortable Lungs clear Sternal incision well-healed Cardiac rhythm regular no rub Minimal pedal edema from bilateral vein harvest for CABG  Diagnostic Tests: Chest x-ray clear no pleural effusions  Impression: Doing well one month postop Activity instructions include driving, light lifting up to 10 pounds maximum and to start cardiac rehabilitation Patient will be referred back to his cardiologist Dr. Juliann Pares in  [place]  Plan: Return for followup in 8 weeks after his hernia repair Renewal prescription for metoprolol provided patient. He'll start his Zocor at [address] [facility] preop. No pain medication requested

## 2013-01-10 ENCOUNTER — Ambulatory Visit: Payer: Self-pay | Admitting: Surgery

## 2013-01-10 LAB — CBC
HCT: 31.6 % — ABNORMAL LOW (ref 40.0–52.0)
HGB: 10.4 g/dL — ABNORMAL LOW (ref 13.0–18.0)
MCH: 29.8 pg (ref 26.0–34.0)
MCHC: 32.8 g/dL (ref 32.0–36.0)
MCV: 91 fL (ref 80–100)
Platelet: 224 10*3/uL (ref 150–440)
RDW: 15 % — ABNORMAL HIGH (ref 11.5–14.5)
WBC: 5.3 10*3/uL (ref 3.8–10.6)

## 2013-01-10 LAB — BASIC METABOLIC PANEL
Anion Gap: 11 (ref 7–16)
Chloride: 101 mmol/L (ref 98–107)
Co2: 25 mmol/L (ref 21–32)
Creatinine: 1.09 mg/dL (ref 0.60–1.30)
EGFR (African American): >60
EGFR (Non-African Amer.): >60
Potassium: 4.6 mmol/L (ref 3.5–5.1)

## 2013-01-17 ENCOUNTER — Ambulatory Visit: Payer: Self-pay | Admitting: Surgery

## 2013-01-17 HISTORY — PX: HERNIA REPAIR: SHX51

## 2013-02-07 ENCOUNTER — Encounter (HOSPITAL_COMMUNITY): Payer: Self-pay | Admitting: *Deleted

## 2013-02-07 ENCOUNTER — Emergency Department (HOSPITAL_COMMUNITY): Payer: Medicare Other

## 2013-02-07 ENCOUNTER — Emergency Department (HOSPITAL_COMMUNITY)
Admission: EM | Admit: 2013-02-07 | Discharge: 2013-02-08 | Disposition: A | Discharge status: Home / Self Care | Payer: Medicare Other | Attending: Emergency Medicine | Admitting: Emergency Medicine

## 2013-02-07 DIAGNOSIS — R141 Gas pain: Secondary | ICD-10-CM | POA: Insufficient documentation

## 2013-02-07 DIAGNOSIS — R112 Nausea with vomiting, unspecified: Secondary | ICD-10-CM | POA: Insufficient documentation

## 2013-02-07 DIAGNOSIS — R42 Dizziness and giddiness: Secondary | ICD-10-CM | POA: Insufficient documentation

## 2013-02-07 DIAGNOSIS — K219 Gastro-esophageal reflux disease without esophagitis: Secondary | ICD-10-CM | POA: Insufficient documentation

## 2013-02-07 DIAGNOSIS — Z79899 Other long term (current) drug therapy: Secondary | ICD-10-CM | POA: Insufficient documentation

## 2013-02-07 DIAGNOSIS — R142 Eructation: Secondary | ICD-10-CM | POA: Insufficient documentation

## 2013-02-07 DIAGNOSIS — I2510 Atherosclerotic heart disease of native coronary artery without angina pectoris: Secondary | ICD-10-CM | POA: Insufficient documentation

## 2013-02-07 DIAGNOSIS — Z7982 Long term (current) use of aspirin: Secondary | ICD-10-CM | POA: Insufficient documentation

## 2013-02-07 DIAGNOSIS — R011 Cardiac murmur, unspecified: Secondary | ICD-10-CM | POA: Insufficient documentation

## 2013-02-07 DIAGNOSIS — Z8739 Personal history of other diseases of the musculoskeletal system and connective tissue: Secondary | ICD-10-CM | POA: Insufficient documentation

## 2013-02-07 DIAGNOSIS — Z87891 Personal history of nicotine dependence: Secondary | ICD-10-CM | POA: Insufficient documentation

## 2013-02-07 DIAGNOSIS — Z8679 Personal history of other diseases of the circulatory system: Secondary | ICD-10-CM | POA: Insufficient documentation

## 2013-02-07 DIAGNOSIS — Z951 Presence of aortocoronary bypass graft: Secondary | ICD-10-CM | POA: Insufficient documentation

## 2013-02-07 DIAGNOSIS — I739 Peripheral vascular disease, unspecified: Secondary | ICD-10-CM | POA: Insufficient documentation

## 2013-02-07 DIAGNOSIS — Z8719 Personal history of other diseases of the digestive system: Secondary | ICD-10-CM | POA: Insufficient documentation

## 2013-02-07 DIAGNOSIS — I252 Old myocardial infarction: Secondary | ICD-10-CM | POA: Insufficient documentation

## 2013-02-07 DIAGNOSIS — IMO0002 Reserved for concepts with insufficient information to code with codable children: Secondary | ICD-10-CM | POA: Insufficient documentation

## 2013-02-07 LAB — CBC
HCT: 33.4 % — ABNORMAL LOW (ref 39.0–52.0)
Hemoglobin: 11.5 g/dL — ABNORMAL LOW (ref 13.0–17.0)
MCH: 30.5 pg (ref 26.0–34.0)
MCHC: 34.4 g/dL (ref 30.0–36.0)
MCV: 88.6 fL (ref 78.0–100.0)
Platelets: 257 10*3/uL (ref 150–400)
RBC: 3.77 MIL/uL — ABNORMAL LOW (ref 4.22–5.81)
RDW: 14.3 % (ref 11.5–15.5)
WBC: 4.8 10*3/uL (ref 4.0–10.5)

## 2013-02-07 LAB — BASIC METABOLIC PANEL
BUN: 19 mg/dL (ref 6–23)
CO2: 25 mEq/L (ref 19–32)
Calcium: 10 mg/dL (ref 8.4–10.5)
Chloride: 99 mEq/L (ref 96–112)
Creatinine, Ser: 1.22 mg/dL (ref 0.50–1.35)
GFR calc Af Amer: 64 mL/min — ABNORMAL LOW (ref >90)
GFR calc non Af Amer: 55 mL/min — ABNORMAL LOW (ref >90)
Glucose, Bld: 124 mg/dL — ABNORMAL HIGH (ref 70–99)
Potassium: 4 mEq/L (ref 3.5–5.1)
Sodium: 135 mEq/L (ref 135–145)

## 2013-02-07 NOTE — ED Notes (Signed)
Per pt and family member. Pt had bypass surgery in dec, followed by hernia repair in feb. Had onset of dizzy spells and nausea 8 days ago, went to [place] [facility] and told it was possible vertigo and treated with meclizine. No relief, still has symptoms and today felt like his abd was "bloated" which was only symptoms with MI in past.

## 2013-02-07 NOTE — ED Provider Notes (Signed)
History     CSN: [id number]  Arrival date & time 02/07/13  4098   First MD Initiated Contact with Patient 02/07/13 2127      Chief Complaint  Patient presents with  . Dizziness  . Nausea    (Consider location/radiation/quality/duration/timing/severity/associated sxs/prior treatment) The history is provided by the patient and medical records. No language interpreter was used.    Bobby [name] is a 79 y.o. male  with a hx of MI, PVD, GERD, CAD presents to the Emergency Department complaining of gradual, persistent, progressively worsening dizziness onset 8 days ago.  Pt with hx of vertebral artery kinking dx in 2007.   Associated symptoms include nausea and vomiting when it first started but none this week.  L ear stuffiness  Nothing makes it better and sitting to standing movement makes it worse.  Pt denies fever, chills, night sweats, CP, SOB, dysuria, headache, constipation.  Hernia Fen 26, 2014 and CABG Dec 2013.  Seen at [facility] on Saturday and Dx with BPPV, but he did not feel that it helped him therefore he has not been taking the medication.  Pt has seen Dr Juliann Pares at [facility] in [place] after his Bypass.     Past Medical History  Diagnosis Date  . PONV (postoperative nausea and vomiting)   . Coronary artery disease     2 stents  . Myocardial infarction     NSTEMI 11/11/12 @ [facility]  . Peripheral vascular disease     (L)CEA 2007 [place]  . GERD (gastroesophageal reflux disease)   . Arthritis     (R)TKR 5/13 [facility]    Past Surgical History  Procedure Laterality Date  . Vascular surgery      (R)TKR 5/13 [facility]  . Cardiac catheterization      11/11/12 [facility],2000,2006 w/ stents  . Joint replacement      (R)TKR 5/13 [facility]  . Coronary artery bypass graft  11/16/2012    Procedure: CORONARY ARTERY BYPASS GRAFTING (CABG);  Surgeon: Kerin Perna, MD;  Location: [facility] OR;  Service: Open Heart Surgery;  Laterality: N/A;  times four using Bilateral Greater Saphenous Vein Graft  harvested endoscopically from bilateral thighs, and Left Internal Mammary Artery.  . Intraoperative transesophageal echocardiogram  11/16/2012    Procedure: INTRAOPERATIVE TRANSESOPHAGEAL ECHOCARDIOGRAM;  Surgeon: Kerin Perna, MD;  Location: [facility] OR;  Service: Open Heart Surgery;  Laterality: N/A;  . Hernia repair    . Cataract extraction      History reviewed. No pertinent family history.  History  Substance Use Topics  . Smoking status: Former Smoker    Types: Cigars    Quit date: 10/22/1989  . Smokeless tobacco: Never Used  . Alcohol Use: No      Review of Systems  Constitutional: Negative for fever, diaphoresis, appetite change, fatigue and unexpected weight change.  HENT: Negative for mouth sores and neck stiffness.   Eyes: Negative for visual disturbance.  Respiratory: Negative for cough, chest tightness, shortness of breath and wheezing.   Cardiovascular: Negative for chest pain.  Gastrointestinal: Positive for nausea. Negative for vomiting, abdominal pain, diarrhea and constipation.  Endocrine: Negative for polydipsia, polyphagia and polyuria.  Genitourinary: Negative for dysuria, urgency, frequency and hematuria.  Musculoskeletal: Negative for back pain.  Skin: Negative for rash.  Allergic/Immunologic: Negative for immunocompromised state.  Neurological: Positive for dizziness. Negative for syncope, light-headedness and headaches.  Hematological: Does not bruise/bleed easily.  Psychiatric/Behavioral: Negative for sleep disturbance. The patient is not nervous/anxious.  Allergies  Percocet; Septra; and Tramadol hcl  Home Medications   Current Outpatient Rx  Name  Route  Sig  Dispense  Refill  . aspirin 325 MG tablet   Oral   Take 325 mg by mouth daily. For pain         . ferrous sulfate 325 (65 FE) MG tablet   Oral   Take 325 mg by mouth every Monday, Wednesday, and Friday.         . fluticasone (FLONASE) 50 MCG/ACT nasal spray   Nasal   Place 2  sprays into the nose daily.         . meclizine (ANTIVERT) 25 MG tablet   Oral   Take 12.5-25 mg by mouth 3 (three) times daily as needed for dizziness (pt takes 1/2 to 1 tab if needed for dizziness).          . metoprolol tartrate (LOPRESSOR) 25 MG tablet   Oral   Take 0.5 tablets (12.5 mg total) by mouth 2 (two) times daily.   30 tablet   1   . Multiple Vitamins-Minerals (CENTRUM SILVER PO)   Oral   Take 1 tablet by mouth daily.         . Multiple Vitamins-Minerals (OCUVITE PRESERVISION PO)   Oral   Take 1 tablet by mouth 2 (two) times daily.         Marland Kitchen omeprazole (PRILOSEC) 20 MG capsule   Oral   Take 20 mg by mouth daily.          Marland Kitchen senna-docusate (SENOKOT-S) 8.6-50 MG per tablet   Oral   Take 1 tablet by mouth daily as needed for constipation.         . simvastatin (ZOCOR) 10 MG tablet   Oral   Take 10 mg by mouth at bedtime.           BP 128/70  Pulse 67  Temp(Src) 98.1 F (36.7 C) (Oral)  Resp 18  SpO2 100%  Physical Exam  Nursing note and vitals reviewed. Constitutional: He is oriented to person, place, and time. He appears well-developed and well-nourished. No distress.  HENT:  Head: Normocephalic and atraumatic.  Mouth/Throat: Oropharynx is clear and moist. No oropharyngeal exudate.  Eyes: Conjunctivae are normal. Pupils are equal, round, and reactive to light. No scleral icterus.  Neck: Normal range of motion. Neck supple.  Cardiovascular: Normal rate, regular rhythm and intact distal pulses.  Exam reveals no gallop and no friction rub.   Murmur heard. Pulmonary/Chest: Effort normal and breath sounds normal. No respiratory distress. He has no wheezes. He has no rales. He exhibits no tenderness.  Abdominal: Soft. Bowel sounds are normal. He exhibits no distension and no mass. There is no tenderness. There is no rebound and no guarding.  Musculoskeletal: Normal range of motion. He exhibits no edema.  Lymphadenopathy:    He has no cervical  adenopathy.  Neurological: He is alert and oriented to person, place, and time. He exhibits normal muscle tone. Coordination normal.  Speech is clear and goal oriented Moves extremities without ataxia  Skin: Skin is warm and dry. No rash noted. He is not diaphoretic. No erythema.  Psychiatric: He has a normal mood and affect.    ED Course  Procedures (including critical care time)  Labs Reviewed  CBC - Abnormal; Notable for the following:    RBC 3.77 (*)    Hemoglobin 11.5 (*)    HCT 33.4 (*)    All other components  within normal limits  BASIC METABOLIC PANEL - Abnormal; Notable for the following:    Glucose, Bld 124 (*)    GFR calc non Af Amer 55 (*)    GFR calc Af Amer 64 (*)    All other components within normal limits  POCT I-STAT TROPONIN I  POCT I-STAT TROPONIN I   Dg Chest 2 View  02/07/2013  *RADIOLOGY REPORT*  Clinical Data: 79 year old male with dizziness weakness and nausea.  CHEST - 2 VIEW  Comparison: 12/20/2012 and prior chest radiographs dating back to 11/14/2012  Findings: Cardiomegaly, CABG changes and lingular scarring again noted. There is no evidence of focal airspace disease, pulmonary edema, suspicious pulmonary nodule/mass, pleural effusion, or pneumothorax. No acute bony abnormalities are identified.  IMPRESSION: Cardiomegaly without evidence of acute cardiopulmonary disease.   Original Report Authenticated By: Harmon Pier, M.D.    Dg Abd Acute W/chest  02/07/2013  *RADIOLOGY REPORT*  Clinical Data: Dizziness and nausea.  ACUTE ABDOMEN SERIES (ABDOMEN 2 VIEW & CHEST 1 VIEW)  Comparison: Chest 02/07/2013  Findings: Gas and stool throughout the colon.  No small or large bowel distension.  No free intra-abdominal air.  No abnormal air fluid levels.  No radiopaque stones.  Degenerative changes in the lumbar spine and hips.  IMPRESSION: Stool filled colon.  Nonobstructive bowel gas pattern.   Original Report Authenticated By: Burman Nieves, M.D.     ECG:  Date:  02/08/2013  Rate: 67  Rhythm: normal sinus rhythm  QRS Axis: right  Intervals: PR prolonged  ST/T Wave abnormalities: nonspecific T wave changes  Conduction Disutrbances:first-degree A-V block   Narrative Interpretation: inverted T waves in V2, changed from previous  Old EKG Reviewed: changes noted    1. Dizziness   2. Coronary artery disease   3. S/P CABG x 4   4. Abdominal bloating       MDM  Thamas Jaegers Hardeman presents with dizziness, L ear fullness and abdominal bloating and discomfort.  Pt dizziness likely 2/2 BPPV.  Pt with similar abdominal symptoms prior to last MI. Symptoms have resolved at this time, but though atypical, are concerning.  Pt without ECG changes and negative troponin x2.  Pt states symptoms have  resolved completely.  Pt ambulates in the department without difficulty and without return of dizziness.    Discussed the patient with patient's cardiologist Dr. Juliann Pares who agrees to see the patient in the office tomorrow.  Patient is to be discharged with recommendation to follow up with PCP in regards to today's hospital visit. Chest pain is not likely of cardiac or pulmonary etiology d/t presentation, perc negative, VSS, no tracheal deviation, no JVD or new murmur, RRR, breath sounds equal bilaterally, EKG without acute abnormalities, negative troponin, and negative CXR. Pt has been advisedto return to the ED if CP becomes exertional, associated with diaphoresis or nausea, radiates to left jaw/arm, worsens or becomes concerning in any way.  Pt appears reliable for follow up and is agreeable to discharge.   Case has been discussed with and seen by Dr. Juleen China who agrees with the above plan to discharge.      Dahlia Client Quianna Avery, PA-C 02/08/13 0139

## 2013-02-08 LAB — POCT I-STAT TROPONIN I: Troponin i, poc: 0.03 ng/mL (ref 0.00–0.08)

## 2013-02-08 MED ORDER — ASPIRIN 81 MG PO CHEW
324.0000 mg | CHEWABLE_TABLET | Freq: Once | ORAL | Status: AC
  Administered 2013-02-08: 324 mg via ORAL
  Filled 2013-02-08: qty 4

## 2013-02-08 NOTE — ED Provider Notes (Signed)
Medical screening examination/treatment/procedure(s) were conducted as a shared visit with non-physician practitioner(s) and myself.  I personally evaluated the patient during the encounter.  79 year old male with what he calls dizziness, BX he describes vertigo. Strongly suspect peripheral etiology. He is also complaining of some GI upset. Very atypical for ACS, but this was apparently the same symptoms he was having before he underwent CABG in December. Workup today is fairly unremarkable including his EKG which is stable and troponin x2 which are both normal. Case was discussed with patient's cardiologist. Can see him in the office tomorrow. Feel patient is safe for discharge at this time for followup then.  Raeford Razor, MD 02/08/13 [phone]

## 2013-02-14 ENCOUNTER — Ambulatory Visit (INDEPENDENT_AMBULATORY_CARE_PROVIDER_SITE_OTHER): Payer: Self-pay | Admitting: Cardiothoracic Surgery

## 2013-02-14 ENCOUNTER — Encounter: Payer: Self-pay | Admitting: Cardiothoracic Surgery

## 2013-02-14 VITALS — BP 133/78 | HR 64 | Temp 97.5°F | Resp 18 | Wt 180.0 lb

## 2013-02-14 DIAGNOSIS — I2510 Atherosclerotic heart disease of native coronary artery without angina pectoris: Secondary | ICD-10-CM

## 2013-02-14 DIAGNOSIS — L03115 Cellulitis of right lower limb: Secondary | ICD-10-CM

## 2013-02-14 DIAGNOSIS — L02419 Cutaneous abscess of limb, unspecified: Secondary | ICD-10-CM

## 2013-02-14 DIAGNOSIS — Z951 Presence of aortocoronary bypass graft: Secondary | ICD-10-CM

## 2013-02-14 NOTE — Progress Notes (Signed)
PCP is Danella Penton., MD Referring Provider is Virgel Manifold, MD  Chief Complaint  Patient presents with  . Routine Post Op    2 month f/u right leg cellulitis, S/P CABG on 11/16/12    HPI: 3 month check after urgent multivessel bypass grafting in late December 2013-CABG x4 with left IMA to LAD, saphenous vein graft to the RV marginal, saphenous vein graft to circumflex marginal, saphenous vein graft to diagonal.  Patient doing well after recent inguinal hernia repair-no cardiac problems associated with operation.  Patient most recently having problems with positional vertigo for 3 days now somewhat better today.  No symptoms of recurrent angina. Surgical incisions all healed. Patient ready to start [employer] walking program.   Past Medical History  Diagnosis Date  . PONV (postoperative nausea and vomiting)   . Coronary artery disease     2 stents  . Myocardial infarction     NSTEMI 11/11/12 @ [facility]  . Peripheral vascular disease     (L)CEA 2007 [place]  . GERD (gastroesophageal reflux disease)   . Arthritis     (R)TKR 5/13 [facility]    Past Surgical History  Procedure Laterality Date  . Vascular surgery      (R)TKR 5/13 [facility]  . Cardiac catheterization      11/11/12 [facility],2000,2006 w/ stents  . Joint replacement      (R)TKR 5/13 [facility]  . Coronary artery bypass graft  11/16/2012    Procedure: CORONARY ARTERY BYPASS GRAFTING (CABG);  Surgeon: Kerin Perna, MD;  Location: [facility] OR;  Service: Open Heart Surgery;  Laterality: N/A;  times four using Bilateral Greater Saphenous Vein Graft harvested endoscopically from bilateral thighs, and Left Internal Mammary Artery.  . Intraoperative transesophageal echocardiogram  11/16/2012    Procedure: INTRAOPERATIVE TRANSESOPHAGEAL ECHOCARDIOGRAM;  Surgeon: Kerin Perna, MD;  Location: [facility] OR;  Service: Open Heart Surgery;  Laterality: N/A;  . Hernia repair  01/17/13    Dr Renda Rolls @ [facility]  . Cataract extraction      No family history on  file.  Social History History  Substance Use Topics  . Smoking status: Former Smoker    Types: Cigars    Quit date: 10/22/1989  . Smokeless tobacco: Never Used  . Alcohol Use: No    Current Outpatient Prescriptions  Medication Sig Dispense Refill  . aspirin 325 MG tablet Take 325 mg by mouth daily. For pain      . cetirizine (ZYRTEC) 10 MG tablet Take 10 mg by mouth daily.      . ferrous sulfate 325 (65 FE) MG tablet Take 325 mg by mouth every Monday, Wednesday, and Friday.      . fluticasone (FLONASE) 50 MCG/ACT nasal spray Place 2 sprays into the nose daily.      . meclizine (ANTIVERT) 25 MG tablet Take 12.5-25 mg by mouth 3 (three) times daily as needed for dizziness (pt takes 1/2 to 1 tab if needed for dizziness).       . metoprolol tartrate (LOPRESSOR) 25 MG tablet Take 0.5 tablets (12.5 mg total) by mouth 2 (two) times daily.  30 tablet  1  . Multiple Vitamins-Minerals (OCUVITE PRESERVISION PO) Take 1 tablet by mouth 2 (two) times daily.      Marland Kitchen omeprazole (PRILOSEC) 20 MG capsule Take 20 mg by mouth daily.       Marland Kitchen senna-docusate (SENOKOT-S) 8.6-50 MG per tablet Take 1 tablet by mouth daily as needed for constipation.      Marland Kitchen  simvastatin (ZOCOR) 10 MG tablet Take 10 mg by mouth at bedtime.      . vitamin C (ASCORBIC ACID) 500 MG tablet Take 500 mg by mouth daily.       No current facility-administered medications for this visit.    Allergies  Allergen Reactions  . Percocet (Oxycodone-Acetaminophen)     Hallucinations   . Septra (Sulfamethoxazole-Tmp Ds)   . Tramadol Hcl Other (See Comments)    Hallucinations    Review of Systems no fever no chest pain  BP 133/78  Pulse 64  Temp(Src) 97.5 F (36.4 C) (Oral)  Resp 18  Wt 180 lb (81.647 kg)  BMI 27.38 kg/m2  SpO2 94% Physical Exam Alert and comfortable Surgical incisions all healed Lungs clear bilaterally Cardiac rhythm regular murmur or gallop No peripheral edema  Diagnostic Tests: None  today  Impression: Doing well 3 months following surgery. No restrictions to activity this point Daily walking walking program encouraged, especially in a organized structure such as the [facility] or cardiac rehabilitation at [facility] regional  Plan: Return as needed

## 2013-04-30 ENCOUNTER — Ambulatory Visit: Payer: Self-pay | Admitting: Unknown Physician Specialty

## 2013-05-16 ENCOUNTER — Ambulatory Visit: Payer: Self-pay | Admitting: Unknown Physician Specialty

## 2013-11-22 DIAGNOSIS — I639 Cerebral infarction, unspecified: Secondary | ICD-10-CM

## 2013-11-22 HISTORY — DX: Cerebral infarction, unspecified: I63.9

## 2014-01-20 ENCOUNTER — Inpatient Hospital Stay: Payer: Self-pay | Admitting: Internal Medicine

## 2014-01-20 LAB — COMPREHENSIVE METABOLIC PANEL
ANION GAP: 5 — AB (ref 7–16)
AST: 24 U/L (ref 15–37)
Albumin: 3.6 g/dL (ref 3.4–5.0)
Alkaline Phosphatase: 67 U/L
BUN: 17 mg/dL (ref 7–18)
Bilirubin,Total: 0.5 mg/dL (ref 0.2–1.0)
Calcium, Total: 9 mg/dL (ref 8.5–10.1)
Chloride: 99 mmol/L (ref 98–107)
Co2: 26 mmol/L (ref 21–32)
Creatinine: 1.35 mg/dL — ABNORMAL HIGH (ref 0.60–1.30)
EGFR (African American): 57 — ABNORMAL LOW
EGFR (Non-African Amer.): 50 — ABNORMAL LOW
Glucose: 99 mg/dL (ref 65–99)
Osmolality: 262 (ref 275–301)
POTASSIUM: 4.5 mmol/L (ref 3.5–5.1)
SGPT (ALT): 23 U/L (ref 12–78)
Sodium: 130 mmol/L — ABNORMAL LOW (ref 136–145)
Total Protein: 7.3 g/dL (ref 6.4–8.2)

## 2014-01-20 LAB — CBC WITH DIFFERENTIAL/PLATELET
BASOS ABS: 0 10*3/uL (ref 0.0–0.1)
Basophil %: 0.3 %
EOS PCT: 0.5 %
Eosinophil #: 0 10*3/uL (ref 0.0–0.7)
HCT: 37.5 % — AB (ref 40.0–52.0)
HGB: 12.3 g/dL — ABNORMAL LOW (ref 13.0–18.0)
Lymphocyte #: 0.4 10*3/uL — ABNORMAL LOW (ref 1.0–3.6)
Lymphocyte %: 5 %
MCH: 31.1 pg (ref 26.0–34.0)
MCHC: 32.9 g/dL (ref 32.0–36.0)
MCV: 94 fL (ref 80–100)
Monocyte #: 0.8 x10 3/mm (ref 0.2–1.0)
Monocyte %: 10.3 %
Neutrophil #: 6.2 10*3/uL (ref 1.4–6.5)
Neutrophil %: 83.9 %
Platelet: 185 10*3/uL (ref 150–440)
RBC: 3.97 10*6/uL — AB (ref 4.40–5.90)
RDW: 12.9 % (ref 11.5–14.5)
WBC: 7.4 10*3/uL (ref 3.8–10.6)

## 2014-01-20 LAB — URINALYSIS, COMPLETE
BLOOD: NEGATIVE
Bacteria: NONE SEEN
Bilirubin,UR: NEGATIVE
Glucose,UR: NEGATIVE mg/dL (ref 0–75)
Ketone: NEGATIVE
Leukocyte Esterase: NEGATIVE
Nitrite: NEGATIVE
Ph: 6 (ref 4.5–8.0)
Protein: NEGATIVE
RBC,UR: <1 /HPF (ref 0–5)
SQUAMOUS EPITHELIAL: NONE SEEN
Specific Gravity: 1.011 (ref 1.003–1.030)
WBC UR: <1 /HPF (ref 0–5)

## 2014-01-20 LAB — TROPONIN I: Troponin-I: <0.02 ng/mL

## 2014-01-21 LAB — BASIC METABOLIC PANEL
ANION GAP: 5 — AB (ref 7–16)
BUN: 18 mg/dL (ref 7–18)
CHLORIDE: 103 mmol/L (ref 98–107)
Calcium, Total: 8.9 mg/dL (ref 8.5–10.1)
Co2: 26 mmol/L (ref 21–32)
Creatinine: 1.35 mg/dL — ABNORMAL HIGH (ref 0.60–1.30)
EGFR (African American): 57 — ABNORMAL LOW
GFR CALC NON AF AMER: 50 — AB
Glucose: 83 mg/dL (ref 65–99)
OSMOLALITY: 269 (ref 275–301)
Potassium: 4 mmol/L (ref 3.5–5.1)
SODIUM: 134 mmol/L — AB (ref 136–145)

## 2014-01-21 LAB — LIPID PANEL
CHOLESTEROL: 152 mg/dL (ref 0–200)
HDL Cholesterol: 42 mg/dL (ref 40–60)
Ldl Cholesterol, Calc: 97 mg/dL (ref 0–100)
TRIGLYCERIDES: 63 mg/dL (ref 0–200)
VLDL CHOLESTEROL, CALC: 13 mg/dL (ref 5–40)

## 2014-01-21 LAB — TSH: Thyroid Stimulating Horm: 1.29 u[IU]/mL

## 2014-01-22 LAB — CBC WITH DIFFERENTIAL/PLATELET
BASOS PCT: 0.2 %
Basophil #: 0 10*3/uL (ref 0.0–0.1)
EOS ABS: 0.1 10*3/uL (ref 0.0–0.7)
Eosinophil %: 1.2 %
HCT: 32.8 % — AB (ref 40.0–52.0)
HGB: 11.5 g/dL — AB (ref 13.0–18.0)
LYMPHS ABS: 0.8 10*3/uL — AB (ref 1.0–3.6)
Lymphocyte %: 12 %
MCH: 32.7 pg (ref 26.0–34.0)
MCHC: 35 g/dL (ref 32.0–36.0)
MCV: 93 fL (ref 80–100)
MONOS PCT: 11.3 %
Monocyte #: 0.7 x10 3/mm (ref 0.2–1.0)
NEUTROS ABS: 4.7 10*3/uL (ref 1.4–6.5)
NEUTROS PCT: 75.3 %
PLATELETS: 154 10*3/uL (ref 150–440)
RBC: 3.51 10*6/uL — ABNORMAL LOW (ref 4.40–5.90)
RDW: 12.5 % (ref 11.5–14.5)
WBC: 6.3 10*3/uL (ref 3.8–10.6)

## 2014-01-22 LAB — BASIC METABOLIC PANEL
Anion Gap: 3 — ABNORMAL LOW (ref 7–16)
BUN: 18 mg/dL (ref 7–18)
CHLORIDE: 103 mmol/L (ref 98–107)
CO2: 24 mmol/L (ref 21–32)
Calcium, Total: 8 mg/dL — ABNORMAL LOW (ref 8.5–10.1)
Creatinine: 1.25 mg/dL (ref 0.60–1.30)
EGFR (African American): >60
GFR CALC NON AF AMER: 54 — AB
Glucose: 85 mg/dL (ref 65–99)
Osmolality: 262 (ref 275–301)
POTASSIUM: 4.2 mmol/L (ref 3.5–5.1)
Sodium: 130 mmol/L — ABNORMAL LOW (ref 136–145)

## 2014-01-22 LAB — LIPID PANEL
Cholesterol: 130 mg/dL (ref 0–200)
HDL: 42 mg/dL (ref 40–60)
Ldl Cholesterol, Calc: 76 mg/dL (ref 0–100)
TRIGLYCERIDES: 60 mg/dL (ref 0–200)
VLDL Cholesterol, Calc: 12 mg/dL (ref 5–40)

## 2014-07-28 IMAGING — CR DG CHEST 2V
2 series · 2 of 2 positions shown · non-contrast
Comparison: 12/06/2012 and 11/21/2012.

CLINICAL DATA: Postop CABG.

CHEST - 2 VIEW

[w chest pa]
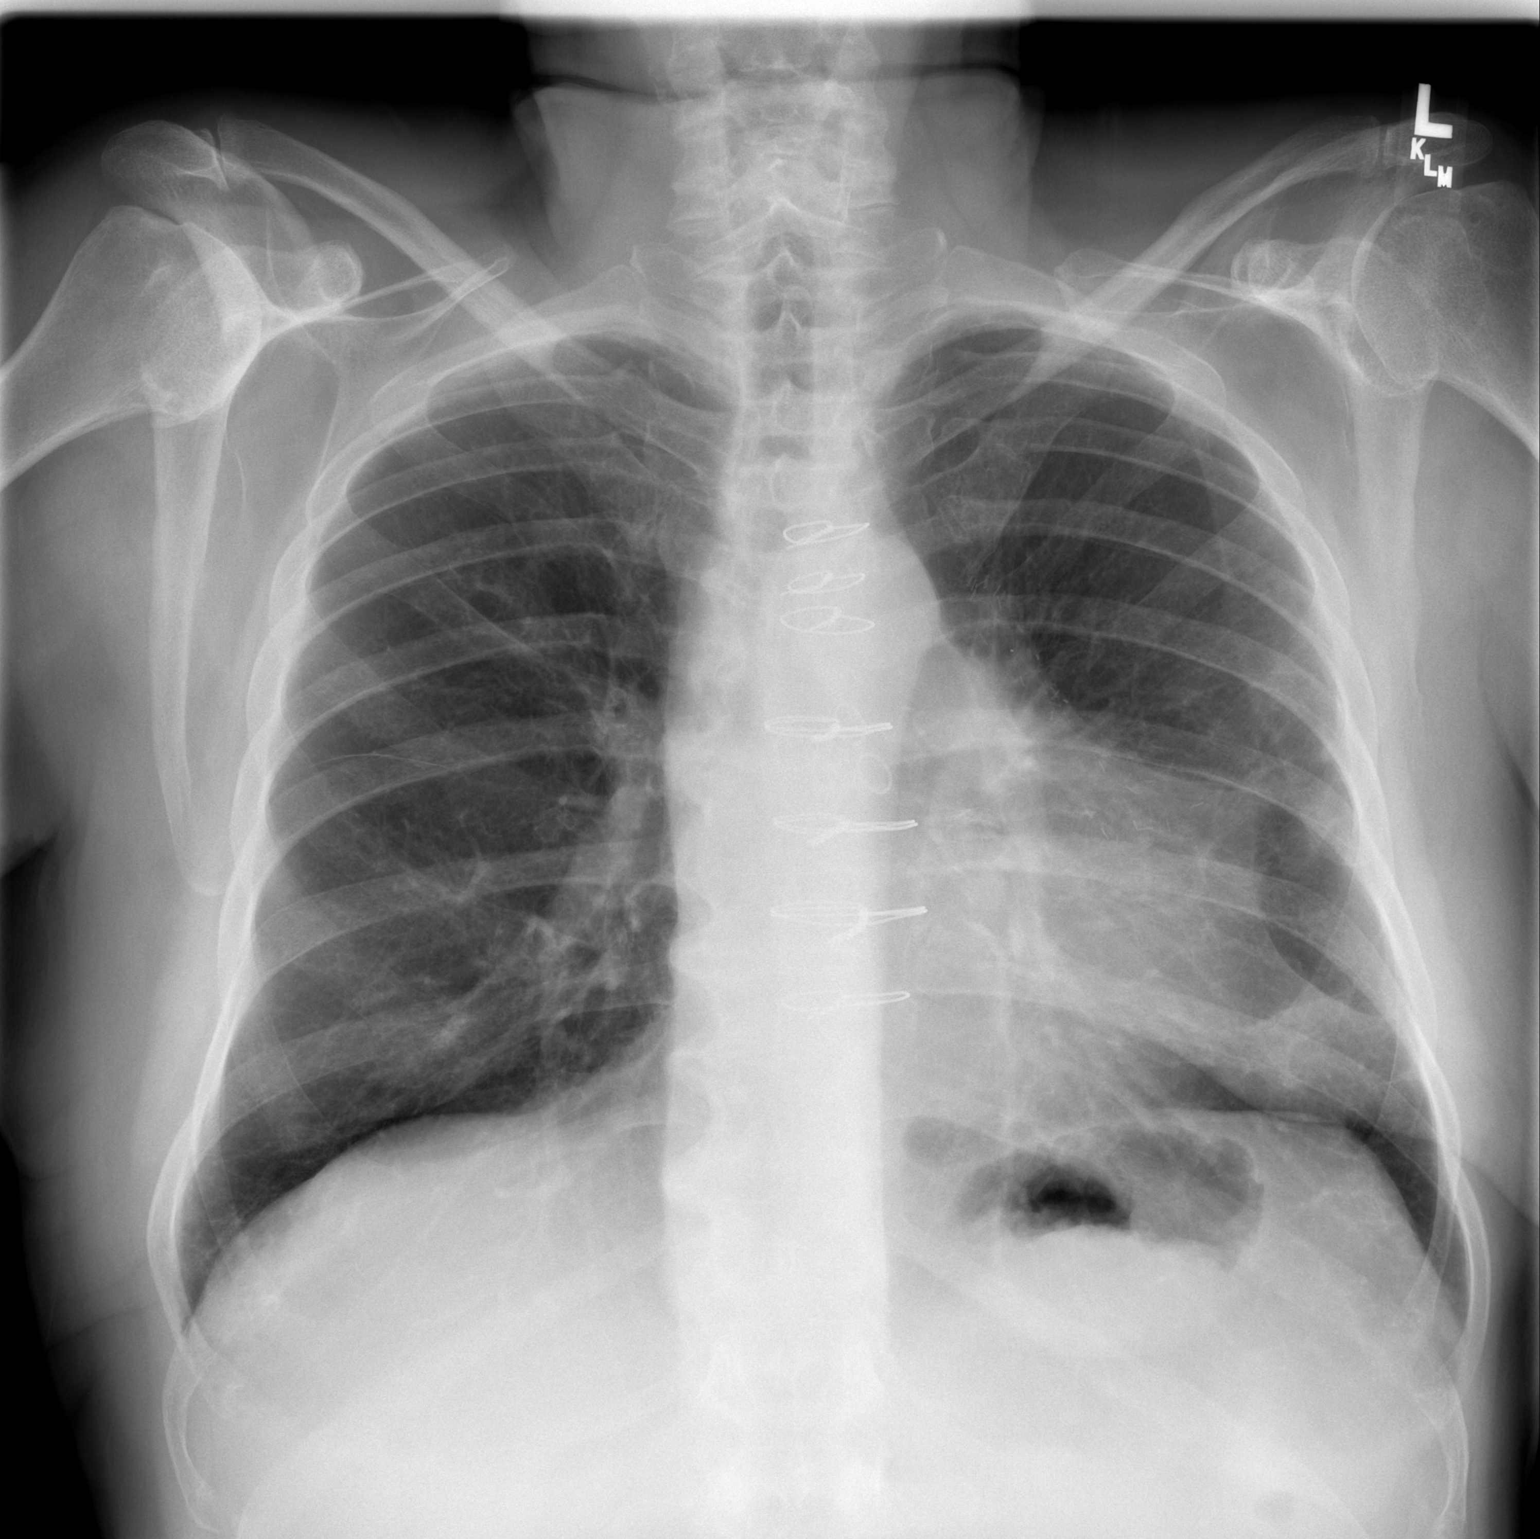

[w chest lat]
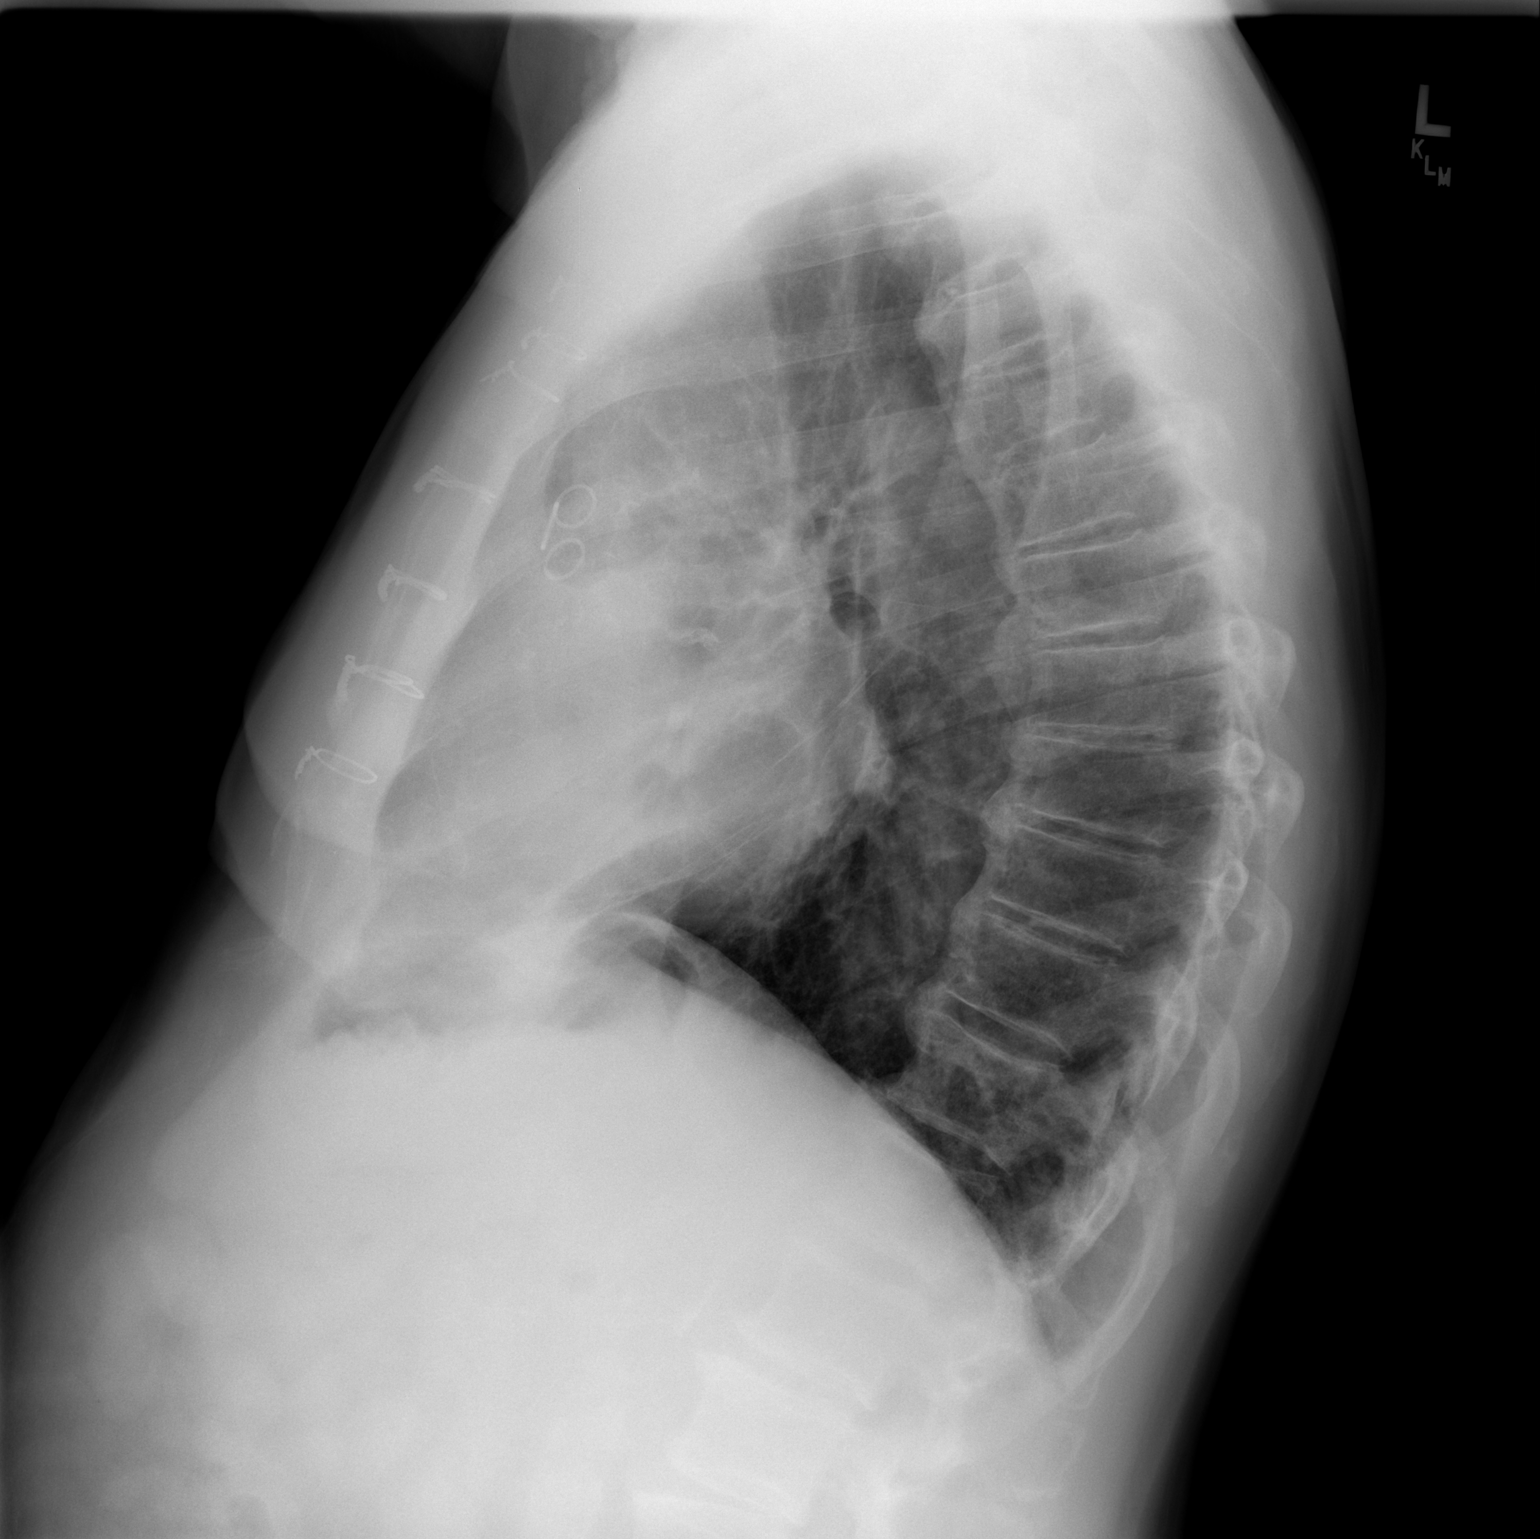

[2 of 2 positions shown; findings below may reference images not displayed]

FINDINGS: The heart size and mediastinal contours are stable status
post CABG.  There is partial obscuration of the left heart border
by pleural thickening anteriorly in the left hemithorax.  There is
mild volume loss in the lingula.  These findings are stable.  The
right lung is clear.  There is no recurrent pleural effusion, edema
or pneumothorax.
IMPRESSION: Stable pleural thickening and volume loss in the left hemithorax
status post CABG.  No acute cardiopulmonary process.

## 2015-03-14 NOTE — Op Note (Signed)
PATIENT NAME:  Bobby [name], Bobby [name] MR#:  [id number] DATE OF BIRTH:  21-Aug-1932  DATE OF PROCEDURE:  01/17/2013  PREOPERATIVE DIAGNOSIS: Left inguinal hernia.   POSTOPERATIVE DIAGNOSIS: Left inguinal hernia.   PROCEDURE: Left inguinal hernia repair.   SURGEON: Renda RollsWilton Brina Umeda, MD  ANESTHESIA: General.   INDICATIONS: This 81 year old male has a several year history of left inguinal hernia. Has been managed with a truss but recently had heart surgery and postoperatively developed an incarcerated left inguinal hernia which with general surgery evaluation was able to get the hernia reduced and now came in for elective repair.  DESCRIPTION OF PROCEDURE: The patient was placed on the operating table, in the supine position, under general anesthesia. The left lower abdomen was prepared with ChloraPrep and draped in a sterile manner.   A transversely oriented left suprapubic incision was made, carried down through subcutaneous tissues. One traversing vein was divided between 4-0 Vicryl ligatures. Scarpa's fascia was incised. The external oblique aponeurosis was incised along the course of its fibers to open the external ring and expose the inguinal cord structures. There was a large mass of tissue comprised of hernia sac and cord structures. The mass of tissue was mobilized from surrounding tissues and found to include the testicle. Next, the hernia sac was identified and dissected free from surrounding structures. It was opened. There was found to be a sliding-type inguinal hernia so that much of the sac was made up of sigmoid colon. The sac was dissected free from surrounding structures and dissected up into the internal ring as it was separated from the cord structures and was inverted. Next, the repair was carried out with a row of 0 Surgilon sutures beginning at the pubic tubercle suturing the conjoined tendon to the shelving edge of the inguinal ligament incorporating transversalis fascia into the repair. The  last stitch led to satisfactory narrowing of the internal ring. Next, an onlay Atrium mesh was selected and cut to create an oval shape of some 2.8 x 3.5 cm with a notch cut out to straddle the cord structures. This was placed along the floor of the inguinal canal and was sutured to the repair with 0 Surgilon and also sutured medially. It was also noted that a relaxing incision was made medially, which was covered by the patch. Following this the cord structures were replaced along the floor of the inguinal canal as the testicle was passed back down into the scrotum. The external oblique aponeurosis was closed with a running 4-0 Vicryl. The subcutaneous tissues and deep fascia was infiltrated with 0.5% Sensorcaine with epinephrine and also subcutaneous tissues were infiltrated adjacent to the skin. The Scarpa's fascia was closed with interrupted 4-0 Vicryl. The skin was closed with running 4-0 Monocryl subcuticular suture and Dermabond. The patient tolerated the procedure satisfactorily. The testicles were in the scrotum, at the end of the procedure, and the patient was prepared for transfer to the recovery room. ____________________________ Shela CommonsJ. Renda RollsWilton Euleta Belson, MD jws:sb D: 01/17/2013 11:27:55 ET T: 01/17/2013 11:44:16 ET JOB#: [id number]  cc: Adella HareJ. Wilton Romie Tay, MD, <Dictator> Adella HareWILTON J Cynithia Hakimi MD ELECTRONICALLY SIGNED 01/18/2013 17:58

## 2015-03-15 NOTE — H&P (Signed)
PATIENT NAME:  Bobby [name], LENORRIS KARGER MR#:  [id number] DATE OF BIRTH:  1932/05/17  DATE OF ADMISSION:  01/20/2014  PRIMARY CARE PROVIDER: Dr. Hyacinth Meeker.   EMERGENCY DEPARTMENT REFERRING PHYSICIAN: Dr. Mindi Junker.   CHIEF COMPLAINT: Difficulty with unsteady gait, syncope.   HISTORY OF PRESENT ILLNESS: The patient is a 81 year old white male with history of hyperlipidemia, osteoarthritis, coronary artery disease, GERD, who also has a history of vertebral stenosis on the left, who states that he has not been feeling well for the past 2 weeks. He has been sleepy and drowsy and has also felt unsteady on his feet. Yesterday, he reports that he did not eat or drink much and then had an episode of emesis x 2, and then this morning he was trying to get some breakfast and passed out. He reports a similar type of episode happen where he had syncope x 2 in the past, felt to be due to dehydration. The patient did have some mild orthostatic changes here in the ED. He also had a CT scan of the head, which did show a 1.7 x 2.2 cm peripheral region of low attenuation of the posterior right cerebral hemisphere, likely focus of subacute ischemia was noted. The patient otherwise denies any visual difficulties, nystagmus. Denies any difficulty with swallowing or any asymmetrical weakness. He denies any chest pain or shortness of breath.   PAST MEDICAL HISTORY: Significant for:  1.  Hyperlipidemia.  2.  Osteoarthritis.  3.  Coronary artery disease, status post CABG, stents and angioplasty.  4.  GERD.  5.  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   PAST SURGICAL HISTORY: Status post right total knee replacement, bilateral cataract surgery, hernia repair.   ALLERGIES: None.   MEDICATIONS AT HOME: Aspirin 81 mg 1 tab p.o. daily, Docusate 100, 1 tab p.o. b.i.d.; metoprolol tartrate 12.5 daily, multivitamin daily, omeprazole 20, 1 tab p.o. b.i.d.; PreserVision 1 tab p.o. b.i.d., senna 1 tab p.o. at bedtime, simvastatin 10  at bedtime, vitamin B12, 250 mcg daily; vitamin C 500 daily, vitamin D3, 1000 International Units b.i.d.   SOCIAL HISTORY: Does not smoke. Does not drink. No drugs.   FAMILY HISTORY: Positive for hypertension.     REVIEW OF SYSTEMS:   CONSTITUTIONAL: Complains of no fevers. Complains of fatigue, weakness. No pain. No weight loss. No weight gain.  EYES: No blurred or double vision. No pain. No redness. No inflammation. Has history of cataracts.  ENT: No tinnitus. No ear pain. No hearing loss. No seasonal or year-round allergies. No epistaxis. No discharge. No difficulty swallowing.  RESPIRATORY: Denies any cough, wheezing, hemoptysis. No dyspnea. No COPD.  CARDIOVASCULAR: No any chest pain, orthopnea or edema. No arrhythmia. Has a history of syncope in the past.  GASTROINTESTINAL: No further nausea or vomiting. Had nausea and vomiting yesterday. No diarrhea. No abdominal pain. No hematemesis. No melena. No ulcers. Has a history of GERD.  GENITOURINARY: Denies any dysuria, hematuria, renal calc or frequency.  ENDOCRINE: Denies any polyuria, nocturia or thyroid problems.  HEMATOLOGIC AND LYMPHATIC: Denies anemia, easy bruisability or bleeding.  SKIN: No acne. No rash. No changes in hair, mole or skin.  MUSCULOSKELETAL: Denies any pain in the neck, back or shoulder.  NEUROLOGIC: No numbness. No history of CVA, TIA or seizures.  PSYCHIATRIC: No anxiety. No insomnia. No ADD.   PHYSICAL EXAMINATION: VITAL SIGNS: Temperature 99.3, pulse 106, respirations 18, blood pressure 119/58 lying down, standing was 94/50.  GENERAL: The patient is a chronically weak-appearing male  in no acute distress.  HEENT: Head atraumatic, normocephalic. Pupils equally round, reactive to light and accommodation. There is no conjunctival pallor. No scleral icterus. Nasal exam shows no drainage or ulceration. Oropharynx is clear without any exudate.  NECK: Supple without any JVD.  CARDIOVASCULAR: Regular rate and rhythm. No  murmurs, rubs, clicks or gallops. PMI is not displaced.  LUNGS: Clear to auscultation bilaterally without any rales, rhonchi, wheezing.  ABDOMEN: Soft, nontender, nondistended. Positive bowel sounds x 4.  EXTREMITIES: No clubbing, cyanosis or edema.  SKIN: No rash.  LYMPHATICS: No lymph nodes palpable.  VASCULAR: Good DP, PT pulses.  PSYCHIATRIC: Not anxious or depressed.  NEUROLOGIC: Awake, alert, oriented x 3. No focal deficits. Cranial nerves II through XII grossly intact.    LABORATORY DATA: CT scan of the head without contrast showed 1.7 x 2.2 cm peripheral region of low attenuation over the posterior right hemisphere, likely a focus of subacute ischemia; chronic ischemic microvascular disease, mild age-related atrophic changes. Glucose 99, BUN 17, creatinine 1.35, sodium 130, potassium 4.5, chloride 99, CO2 is 26, calcium 9.0. LFTs are normal. Troponin less than 0.02. WBC 7.4, hemoglobin 12.3, platelet count 185.   ASSESSMENT AND PLAN: The patient is a 81 year old white male with history of coronary artery disease, hypertension, history of syncope x 2 in the past, presents with not feeling well for the past 2 weeks with generalized weakness, had syncope today, comes to the ED. Had a CT of the head that shows subacute cerebrovascular accident.  1.  Possible cerebrovascular accident. At this time, we will continue aspirin. We will get MRI of the brain, as well as, due to history of vertebral stenosis, we will get an MRA of the brain.  2.  Syncope with orthostatic blood pressure changes. We will give him IV fluids, hold metoprolol.  3.  Coronary artery disease. Continue aspirin. Hold metoprolol.  4. Acute renal failure, likely due to mild dehydration. Give him IV fluids. Follow his renal function.  5.  Hyponatremia, likely due to dehydration. Give him IV fluids. Follow his sodium.  6.  Gastroesophageal reflux disease. We will continue omeprazole.  7.  Miscellaneous: We will do heparin for DVT  prophylaxis.   50 minutes spent.    ____________________________ Lacie ScottsShreyang H. Allena KatzPatel, MD shp:dmm D: 01/20/2014 15:18:00 ET T: 01/20/2014 20:55:31 ET JOB#: [id number]  cc: Jaala Bohle H. Allena KatzPatel, MD, <Dictator> Charise CarwinSHREYANG H Tallis Soledad MD ELECTRONICALLY SIGNED 01/29/2014 15:21

## 2015-03-15 NOTE — Discharge Summary (Signed)
PATIENT NAME:  Bobby [name], Bobby [name] MR#:  [id number] DATE OF BIRTH:  10/23/1932  DATE OF ADMISSION:  01/20/2014 DATE OF DISCHARGE:  01/22/2014  DISCHARGE DIAGNOSES: 1.  Acute right cerebellar infarct.  2.  Vertebral artery stenosis.  3.  Coronary artery disease.  4.  Hypertension.  5.  Osteoarthritis.   DISCHARGE MEDICATIONS: 1.  PreserVision daily.  2.  Omeprazole 20 mg b.i.d.  3.  Aspirin 81 mg every other day. 4.  Metoprolol tartrate 25 mg 1/2 tab daily. 5.  Simvastatin 20 mg at bedtime.  6.  Plavix 75 mg daily.   REASON FOR ADMISSION: This is a 81 year old male who presents with disequilibrium and lightheadedness. Please see H and P for HPI, past medical history, and physical exam.   HOSPITAL COURSE: The patient was admitted, found to have a right cerebellar infarct that was confirmed by MRI. MRA did show mild to moderate stenosis of vessels, but no significant occlusion, fortunately. He was minimally to not symptomatic. He was able to get up and around. He did not require physical therapy. His Plavix was added in light of his symptoms and CVA in the place of a baby aspirin.  Simvastatin was doubled to 20 mg dropping his LDL down to 76. He was hydrated and that helped him as well. Overall prognosis is guarded. Echocardiogram did show normal LV systolic function with no significant valvular heart disease, and he remained in normal sinus rhythm throughout the hospitalization. Overall prognosis is guarded. He will follow up with Dr. Hyacinth MeekerMiller in 1 week.  ____________________________ Danella PentonMark F. Quantez Schnyder, MD mfm:sb D: 01/22/2014 07:30:26 ET T: 01/22/2014 14:34:40 ET JOB#: [id number]  cc: Danella PentonMark F. Juancarlos Crescenzo, MD, <Dictator> Jye Fariss Sherlene ShamsF Regino Fournet MD ELECTRONICALLY SIGNED 01/22/2014 19:40

## 2015-03-16 NOTE — Op Note (Signed)
PATIENT NAME:  Bold, Bobby [name] MR#:  [id number] DATE OF BIRTH:  Feb 09, 1932  DATE OF PROCEDURE:  04/03/2012  PREOPERATIVE DIAGNOSIS: Degenerative arthrosis of the right knee.   POSTOPERATIVE DIAGNOSIS: Degenerative arthrosis of the right knee.   PROCEDURE PERFORMED: Right total knee arthroplasty using computer-assisted navigation.   SURGEON: Francesco SorJames Hooten, M.D.   ASSISTANT: Van ClinesJon Wolfe, PA-C (required to maintain retraction throughout the procedure)   ANESTHESIA: Femoral nerve block and spinal.   ESTIMATED BLOOD LOSS: 200 mL.   FLUIDS REPLACED: 2000 mL crystalloid.   TOURNIQUET TIME: 94 minutes.   DRAINS: Two medium drains to reinfusion system.   SOFT TISSUE RELEASES: Anterior cruciate ligament, posterior cruciate ligament, deep and superficial medial collateral ligament, and patellofemoral ligament.   IMPLANTS UTILIZED: DePuy PFC Sigma size 5 posterior stabilized femoral component (cemented), size 5 MBT tibial component (cemented), 35 mm three peg oval dome patella (cemented), and a 10 mm stabilized rotating platform polyethylene insert.   INDICATIONS FOR SURGERY: The patient is a 81 year old male who has been seen for complaints of progressive right knee pain. X-rays demonstrated severe degenerative changes in tricompartmental fashion with varus deformity. After discussion of the risks and benefits of surgical intervention, the patient expressed his understanding of the risks and benefits and agreed with plans for surgical intervention.   PROCEDURE IN DETAIL: The patient was brought into the Operating Room and, after adequate femoral nerve block and spinal anesthesia was achieved, a tourniquet was placed on the patient's upper right thigh. The patient's right knee and leg were cleaned and prepped with alcohol and DuraPrep and draped in the usual sterile fashion. A "time out" was performed as per usual protocol. The right lower extremity was exsanguinated using an Esmarch, and the  tourniquet was inflated to 300 mmHg. An anterior longitudinal incision was made followed by a standard mid vastus approach. A large effusion was evacuated. The deep fibers of the medial collateral ligament were elevated in subperiosteal fashion off the medial flare of the tibia so as to maintain a continuous soft tissue sleeve. The patella was subluxed laterally and the patellofemoral ligament was incised. Inspection of the knee demonstrated severe degenerative changes with eburnated bone noted to the medial compartment. Prominent osteophytes were debrided using a rongeur. Anterior and posterior cruciate ligaments were excised. Two 4.0 mm Schanz pins were inserted into the femur and into the tibia for attachment of the array of spheres used for computer-assisted navigation. Hip center was identified using a circumduction technique. Distal landmarks were mapped using the computer. The distal femur and proximal tibia were mapped using the computer. Distal femoral cutting guide was positioned using computer-assisted navigation so as to achieve a 5 degree distal valgus cut. Cut was performed and verified using the computer. Distal femur was then sized and it was felt that a size 5 femoral component was appropriate. A size 5 cutting guide was positioned using computer-assisted navigation and the anterior cut was performed and verified using the computer. This was followed by completion of the posterior and chamfer cuts. Femoral cutting guide for the central box was then positioned and the central box cut was performed.   Attention was then directed to the proximal tibia. Medial and lateral menisci were excised. The extramedullary tibial cutting guide was positioned using computer-assisted navigation so as to achieve 0 degree varus valgus alignment and 0 degree posterior slope. The cut was performed and verified using the computer. The proximal tibia was sized and it was felt that a size 5 tibial  tray was appropriate.  Tibial and femoral trials were inserted followed by insertion of a 10 mm polyethylene trial. The knee was felt to be tight medially. The superficial fibers of the medial collateral ligament were elevated in subperiosteal fashion off the medial flare of the tibia. This allowed for excellent mediolateral soft tissue balancing both in extension and in 90 degrees of flexion. Finally, the patella was cut and prepared so as to accommodate a 35 mm three peg oval dome patella. Patellar trial was placed and the knee was placed through a range of motion with excellent patellar tracking appreciated.   Femoral trial was removed. An osteochondral loose body was removed from the posterior recess. Central post hole for the tibial component was reamed followed by insertion of a keel punch. Tibial trial was then removed. Cut surfaces of bone were irrigated with copious amounts of normal saline with antibiotic solution using pulsatile lavage and then suctioned dry. Polymethyl methacrylate cement was prepared in the usual fashion using a vacuum mixer. Cement was applied to the cut surface of the tibia as well as along the undersurface of a size 5 MBT tibial component. The tibial component was positioned and impacted into place. Excess cement was removed using freer elevators. Cement was then applied to the cut surface of the femur as well as along the posterior flanges of a size 5 posterior stabilized femoral component. Femoral component was positioned and impacted into place. Excess cement was removed using freer elevators. A 10 mm polyethylene trial was inserted and the knee was brought into full extension with steady axial compression applied. Finally, cement was applied to the backside of a 35 mm oval dome patella and the patellar component was positioned and patellar clamp applied. Excess cement was removed using freer elevators.   After adequate curing of the cement, the tourniquet was deflated after total tourniquet time of  94 minutes. Hemostasis was achieved using electrocautery. The knee was irrigated with copious amounts of normal saline with antibiotic solution using pulsatile lavage and then suctioned dry. The knee was inspected for any residual cement debris. 30 mL of 0.25% Marcaine with epinephrine was injected along the posterior capsule. A 10 mm stabilized rotating platform polyethylene insert was inserted and the knee was placed through a range of motion. Excellent mediolateral soft tissue balancing was appreciated both in full extension and in 90 degrees of flexion. Excellent patellar tracking was appreciated.   Two medium drains were placed in the wound bed and brought out through a separate stab incision to be attached to a reinfusion system. The medial parapatellar portion of the incision was reapproximated using interrupted sutures of #1 Vicryl. The subcutaneous tissue was approximated in layers using first #0 Vicryl followed by #2-0 Vicryl. Skin was closed with skin staples. A sterile dressing was applied.   The patient tolerated the procedure well. She was transported to the recovery room in stable condition. ____________________________ Illene Labrador. Angie Fava., MD jph:slb D: 04/03/2012 19:41:28 ET    T: 04/04/2012 10:06:42 ET        JOB#: [id number] cc: Fayrene Fearing P. Angie Fava., MD, <Dictator> JAMES P Angie Fava MD ELECTRONICALLY SIGNED 04/08/2012 21:24

## 2015-03-16 NOTE — Consult Note (Signed)
PATIENT NAME:  Bobby [name], Bobby [name] MR#:  [id number] DATE OF BIRTH:  1932/09/17  DATE OF CONSULTATION:  04/11/2012  REFERRING PHYSICIAN:  Sital P. Juliene PinaMody, MD  PRIMARY CARE PHYSICIAN: Bobby PunchesMark Miller, MD  CONSULTING PHYSICIAN:  Bobby [name] D. Bobby Womac, MD  INDICATION: Syncope.   HISTORY OF PRESENT ILLNESS: Mr. Bobby [name] is a [age] year old white male with a history of coronary artery disease, arthritis, right total knee replacement about a week ago, states he was doing reasonably well but stopped drinking as much and may have gotten slightly dehydrated. In the morning he drank two sips of coffee, started to feel dizzy and lightheaded for a few seconds. He leaned on the sink and eventually had a syncopal episode and passed out. He was out for maybe 20 minutes. His wife was asleep, so she did not hear him. When he woke up on the floor, he felt a little bit better, and his wife found him. He lay there for a while, and his wife states that he may have had another episode where he got ashen-looking, and his eyes rolled back. This only lasted a few seconds, however. He denied any chest pain, no shortness of breath, no weakness or fatigue.   REVIEW OF SYSTEMS: Positive blackout spell. Positive syncope. No significant nausea or vomiting. No fever, no chills, no sweats. No weight loss. No weight gain. No hemoptysis or hematemesis. No bright red blood per rectum. No vision change. No hearing change. He denies sputum production or cough.   PAST MEDICAL HISTORY:  1. Coronary artery disease. 2. Cerebrovascular accident. 3. Aspirin 81 mg a day.  4. Chondroitin/glucosamine 1 tablet bid.  5. Lovenox 40 mg daily. 6. Magnesium and zinc 400 b.i.d. 7. Multivitamin once a day.  8. Oxycodone 5 mg every 4 hours p.r.n.  9. PreserVision b.i.d.  10. Ultram 50 mg p.r.n.  11. Vitamin C 500 b.i.d.   12. Vitamin D.  ALLERGIES: ACE inhibitors.   PAST SURGICAL HISTORY:  1. Carotid endarterectomy.  2. Total knee replacement done May 2013 by  Bobby [name].   FAMILY HISTORY: Coronary artery disease.   SOCIAL HISTORY: No smoking or alcohol consumption. He is retired.   PHYSICAL EXAMINATION:  VITAL SIGNS: Blood pressure 150/80, pulse 70, respiratory 18, afebrile.   HEENT: Normocephalic, atraumatic. Pupils are reactive to light.   NECK: Supple. No jugular venous distention, bruits or adenopathy.   LUNGS: Clear to auscultation and percussion. No significant wheeze, rhonchi, or rale.   HEART: Regular rate and rhythm. Positive systolic ejection murmur left sternal border. PMI is nondisplaced.   ABDOMEN: Exam is benign.   EXTREMITIES: He has a healing right knee scar from total knee replacement with TED stockings on.  NEUROLOGIC: Exam intact.   SKIN: Exam normal.   LABORATORY, DIAGNOSTIC AND RADIOLOGICAL DATA:  White count 5, hemoglobin 10, hematocrit 29, platelet count 265.  Sodium 135, potassium 4.2, chloride 100, bicarbonate 24, BUN 23, creatinine 1.35, glucose 112.  Troponin was negative. CKs were negative. CT of the head was negative.  VQ scan was negative.  Dopplers of the legs were negative.  Chest x-ray was negative.  EKG: Right bundle branch block, nonspecific findings.   ASSESSMENT:  1. Syncope. 2. Arthritis. 3. Coronary artery disease. 4. Peripheral vascular disease.  5. Hyponatremia.  6. Renal insufficiency. 7. Anemia. 8. Mild dehydration.  9. Hypertension.   PLAN: I agree with evaluation. The patient is doing  well. We will check his echocardiogram. He denied any cardiac symptoms, no palpitations, tachycardia, or  chest pain. No shortness of breath. Evaluation for pulmonary embolus was negative. We will probably treat the patient conservatively. I recommend hydration and physical therapy. I do not recommend any direct cardiac intervention or therapy at this point for this what appeared to be a vagal episode or syncopal episode. I will hold off on a Holter unless symptoms recur and continue to treat the  patient conservatively.   ____________________________ Bobby Stack Juliann Pares, MD ddc:cbb D: 04/12/2012 17:03:30 ET T: 04/12/2012 17:47:30 ET JOB#: [id number]  cc: Bobby [name] D. Juliann Pares, MD, <Dictator> Bobby Pea MD ELECTRONICALLY SIGNED 04/13/2012 11:37

## 2015-03-16 NOTE — Discharge Summary (Signed)
PATIENT NAME:  Bobby [name], Bobby [name] MR#:  [id number] DATE OF BIRTH:  [birth year]  DATE OF ADMISSION:  04/11/2012 DATE OF DISCHARGE:  04/12/2012  DISCHARGE DIAGNOSES:  1. Syncope secondary to dehydration with hypotension.  2. Coronary artery disease.  3. Postoperative knee replacement.  4. Reflux esophagitis.   DISCHARGE MEDICATIONS:  1. Magnesium 400 mg daily.  2. Aspirin 81 mg daily.  3. Lovenox 40 mg subcutaneous daily. 4. Ultram 50 mg t.i.d. p.r.n. pain. 5. Oxycodone 5 mg 1 to 2 q.4 p.r.n. pain.   REASON FOR ADMISSION: A 81 year old male presents with syncope. Please see history and physical for history of present illness, past medical history, and physical exam.   HOSPITAL COURSE: The patient was admitted, ruled out for myocardial infarction. V/Q scan was negative. Brain CT showed no acute change. Carotid Doppler and leg Doppler's were both essentially normal. The BUN/creatinine ratio was elevated. He was hydrated. Symptoms went away. His syncope was thought due to dehydration with resulting hypotension. He was back to baseline state and will be going home to follow-up with Dr. Hyacinth MeekerMiller and orthopedics post knee replacement.   OVERALL PROGNOSIS: Good.   ____________________________ Danella PentonMark F. Miller, MD mfm:ap D: 04/12/2012 16:39:08 ET T: 04/13/2012 14:55:45 ET JOB#: [id number] cc: Danella PentonMark F. Miller, MD, <Dictator> MARK Sherlene ShamsF MILLER MD ELECTRONICALLY SIGNED 04/21/2012 8:13

## 2015-03-16 NOTE — H&P (Signed)
PATIENT NAME:  Bobby [name], Bobby [name] MR#:  [id number] DATE OF BIRTH:  1932/10/24  DATE OF ADMISSION:  04/11/2012  PRIMARY CARE PHYSICIAN: Bethann Punches, MD   CHIEF COMPLAINT: Syncope.   HISTORY OF PRESENT ILLNESS: The patient is a 81 year old male with a history of coronary artery disease, arthritis, recent right total knee who presents with syncope. The patient says that he has been in his usual state of health. This morning around 7:45 he was making some coffee. He felt very lightheaded. He leaned on the sink and then the next thing he knew he woke up on the floor and this was at 8:08 so he does not recall about 22 minutes of event from 7:45 to about 8:08. No chest pain, shortness of breath, or urinary symptoms.   REVIEW OF SYSTEMS: CONSTITUTIONAL: No fever, fatigue, weakness. EYES: No blurred or double vision. ENT: No ear pain, hearing loss, seasonal allergies, snoring, epistaxis. RESPIRATORY: No cough, wheezing, hemoptysis, COPD. CARDIOVASCULAR: No chest pain. No palpitations. No orthopnea. No edema, arrhythmia, dyspnea on exertion. Positive syncope. GI: No nausea, vomiting, diarrhea, abdominal pain, melena, ulcers. GU: No dysuria or hematuria, frequency or urgency. ENDOCRINE: No polyuria or polydipsia. No heat or cold intolerance. HEME/LYMPH: Positive easy bruising. SKIN: No rash or lesions. MUSCULOSKELETAL: Some limited activity in the right knee due to the total right knee replacement. NEUROLOGIC: Remote history of CVA. No TIA. PSYCHIATRIC: No history of anxiety or depression.   PAST MEDICAL HISTORY:  1. Coronary artery disease.  2. Cerebrovascular accident.  3. Arthritis.   MEDICATIONS:  1. Aspirin 81 mg daily.  2. Chondroitin/glucosamine 1 tablet b.i.d.  3. Lovenox 40 mg daily. This is for total right knee. 4. Magnesium with Zinc 400 mg b.i.d.  5. Multivitamin 1 tablet daily.  6. Oxycodone 5 mg q.4 hours p.r.n.  7. PreserVision oral tablet b.i.d.  8. Ultram 50 mg p.r.n.  9. Vitamin C 500 mg  b.i.d.  10. Vitamin D3 1000 international units b.i.d.   ALLERGIES: ACE inhibitors caused the patient to pass out.   PAST SURGICAL HISTORY:  1. Carotid endarterectomy.   2. Total right knee which was performed 04/03/2012 by Dr. Ernest Pine.   FAMILY HISTORY: Positive for coronary artery disease.   SOCIAL HISTORY: No tobacco, alcohol, or drug use.   PHYSICAL EXAMINATION:   VITAL SIGNS: Temperature 99.2, pulse 68, respirations 14, blood pressure 147/88. Orthostatics were checked. He is not orthostatic.   GENERAL: The patient is alert, oriented, not in acute distress.   HEENT: Head is atraumatic. Pupils are round. Sclerae anicteric. Mucous membranes are moist. Oropharynx is clear.   NECK: Supple without JVD, carotid bruit, or enlarged thyroid.   CARDIOVASCULAR: Regular rate and rhythm. No murmurs, gallops, or rubs. PMI is not displaced.   LUNGS: Clear to auscultation bilaterally without crackles, rales, rhonchi, or wheezing. Normal percussion.   ABDOMEN: Bowel sounds are positive. Nontender, nondistended. No hepatosplenomegaly.   EXTREMITIES: No clubbing or cyanosis. He does have edema of the right knee and leg after the right knee replacement.   BACK: No CVA or vertebral tenderness.   NEUROLOGIC: Cranial nerves are grossly intact. No focal deficits.   SKIN: Without rash or lesions.   LABORATORY, DIAGNOSTIC AND RADIOLOGIC DATA: White blood cells 5, hemoglobin 10.2, hematocrit 29.4, platelets 265, sodium 135, potassium 4.2, chloride 100, bicarb 24, BUN 23, creatinine 1.35, glucose 112, calcium 9.1. Troponin less than 0.02. CK 166. CPK-MB 1.0.   CT of the head without contrast no acute intracranial hemorrhage or  CVA.   Lung V/Q scan negative for PE.  Lower extremity Doppler's were negative for DVT.  Chest x-ray shows no acute cardiopulmonary disease.   EKG has a right bundle branch block with normal sinus rhythm.   ASSESSMENT AND PLAN: This is a 81 year old male with a recent  right total knee who presents with syncope.  1. Syncope likely due to mild dehydration as evidenced by slightly elevated BUN/creatinine ratio as well as some very mild hyponatremia. Will continue to follow orthostatics and gently hydrate the patient. Will place the patient on telemetry to rule out cardiac cause. The family wishes for Dr. Juliann Paresallwood to see the patient while he is here so I have written for Dr. Juliann Paresallwood to see the patient as well as an echocardiogram. Continue to follow the troponin, CPK, CK.  2. Arthritis. Continue p.r.n. pain meds.  3. History of coronary artery disease. Will continue aspirin.  4. Hyponatremia, very mild, likely secondary to some mild dehydration.  5. Acute renal insufficiency again due to some mild dehydration. Will replete and recheck a BMP in the a.m.  6. Anemia. Further work-up can be performed as an outpatient.   CODE STATUS: The patient is FULL CODE status.      TIME SPENT: Approximately 45 minutes.   ____________________________ Janyth ContesSital P. Juliene PinaMody, MD spm:drc D: 04/11/2012 16:14:43 ET T: 04/11/2012 16:51:49 ET JOB#: [id number]  cc: Doratha Mcswain P. Juliene PinaMody, MD, <Dictator> Danella PentonMark F. Miller, MD Janyth ContesSITAL P Crystallee Werden MD ELECTRONICALLY SIGNED 04/12/2012 20:47

## 2015-03-16 NOTE — Discharge Summary (Signed)
PATIENT NAME:  Bobby [name], Bobby [name] MR#:  [id number] DATE OF BIRTH:  [birth year]  DATE OF ADMISSION:  04/03/2012 DATE OF DISCHARGE:  04/06/2012  ADMITTING DIAGNOSIS: Degenerative arthrosis of right knee.   DISCHARGE DIAGNOSIS: Degenerative arthrosis of right knee.   HISTORY: Patient is a 81 year old who has been [facility] for progression of right knee discomfort. The patient had reported a 6 to 7 year history of right knee pain. He had localized most of the pain along the medial aspect of the knee. His pain was noted to be aggravated with weight-bearing activities. He denied any night discomfort. He was not using any type of ambulatory aid at the time of surgery. Patient had not seen any significant improvement in his condition despite anti-inflammatory medications, intra-articular cortisone injections as well as Synvisc injections. The right knee pain had progressed to the point that it was significantly interfering with his activities of daily living. X-rays taken in the clinic showed narrowing of the medial cartilage space with associated varus alignment. He was noted to have osteophyte as well as subchondral sclerosis. After discussion of the risks and benefits of surgical intervention, the patient expressed his understanding of the risks and benefits and agreed for plans for surgical intervention.   PROCEDURE: Right total knee arthroplasty using computer-assisted navigation.   ANESTHESIA: Femoral nerve block with spinal.   SOFT TISSUE RELEASE: Anterior cruciate ligament, posterior cruciate ligament, deep and superficial medial collateral ligament, and patellofemoral ligament.   IMPLANTS UTILIZED: DePuy PFC Sigma size 5 posterior stabilized femoral component (cemented), size 5 MBT tibial component (cemented), 35 mm three pegged oval dome patella (cemented), and a 10 mm stabilized rotating platform polyethylene insert.   HOSPITAL COURSE: Patient tolerated the procedure very well. He  had no complications. He was then taken to PAC-U where he was stabilized then transferred to the orthopedic floor. He began receiving anticoagulation therapy of Lovenox 30 mg subcutaneous q.12 hours per anesthesia protocol. He was fitted with TED stockings bilaterally. These were allowed to be removed one hour per eight hour shift. The right one was applied on day two following removal of the Hemovac and the changing of the dressing. He was also fitted with the AV-I compression foot pumps bilaterally set at 80 mmHg. His calves have been nontender, free of any evidence of any deep venous thromboses. Negative Homans sign. Heels were elevated off the bed using rolled towels. The heels have been nontender. Neurovascular and neurosensory appeared to be intact and within normal limits.   Patient has denied any chest pain or any shortness of breath. Vital signs are stable. He has been afebrile. Hemodynamically he was stable. No transfusions were given other than the Autovac transfusions given the first six hours postoperatively.   Physical therapy was initiated on day one for gait training and transfers. He has done extremely well. Upon being discharged was ambulating greater than 250 feet. Was able go up and down four sets of steps. Was independent with bed to chair transfers. Occupational therapy was also initiated on day one for activities of daily living and assistive devices.   Patient's IV, Foley and Hemovac were all discontinued on day two along with the dressing change. The wound has been free of any drainage or signs of infection. The Polar Care was reapplied to the surgical leg maintaining a temperature of 40 to 50 degrees Fahrenheit.   DISPOSITION: Patient is being discharged to home in improved stable condition.   DISCHARGE INSTRUCTIONS:  1. He may  weight bear as tolerated. Continue using a walker until cleared by physical therapy to go to a quad cane. He will receive home health physical therapy.   2. He is placed on a regular diet.  3. He is to continue wearing TED stockings. These are to be worn during the day but may be removed at night.  4. Continue Polar Care maintaining a temperature of 40 to 50 degrees Fahrenheit.  5. He has follow-up appointment in two weeks, sooner if any temperatures of 101.5 or greater or excessive bleeding.  6. He was instructed on wound care.  7. He is to resume his regular medications he was on prior to admission. He was given three prescriptions. These consisted of Lovenox 40 mg subcutaneous daily for 14 days, then discontinue and begin taking one 81 mg enteric-coated aspirin, Roxicodone 5 to 10 mg every 4 to 6 hours p.r.n. for pain and Ultram 1 to 2 tablets every 4 to 6 hours p.r.n. for pain.     PAST MEDICAL HISTORY:  1. Arthritis.  2. Cataracts.  3. Chickenpox.  4. Hypercholesterolemia.   ____________________________ Van Clines, PA jrw:cms D: 04/06/2012 07:44:01 ET T: 04/06/2012 11:23:22 ET JOB#: [id number]  cc: Van Clines, PA, <Dictator> JON WOLFE PA ELECTRONICALLY SIGNED 04/06/2012 21:04

## 2015-03-26 DIAGNOSIS — I639 Cerebral infarction, unspecified: Secondary | ICD-10-CM | POA: Insufficient documentation

## 2016-02-09 DIAGNOSIS — H353132 Nonexudative age-related macular degeneration, bilateral, intermediate dry stage: Secondary | ICD-10-CM | POA: Diagnosis not present

## 2016-04-05 DIAGNOSIS — I639 Cerebral infarction, unspecified: Secondary | ICD-10-CM | POA: Diagnosis not present

## 2016-04-05 DIAGNOSIS — Z0000 Encounter for general adult medical examination without abnormal findings: Secondary | ICD-10-CM | POA: Diagnosis not present

## 2016-04-05 DIAGNOSIS — I2510 Atherosclerotic heart disease of native coronary artery without angina pectoris: Secondary | ICD-10-CM | POA: Diagnosis not present

## 2016-04-05 DIAGNOSIS — Z125 Encounter for screening for malignant neoplasm of prostate: Secondary | ICD-10-CM | POA: Diagnosis not present

## 2016-06-30 DIAGNOSIS — M47816 Spondylosis without myelopathy or radiculopathy, lumbar region: Secondary | ICD-10-CM | POA: Diagnosis not present

## 2016-06-30 DIAGNOSIS — M545 Low back pain: Secondary | ICD-10-CM | POA: Diagnosis not present

## 2016-06-30 DIAGNOSIS — M4316 Spondylolisthesis, lumbar region: Secondary | ICD-10-CM | POA: Diagnosis not present

## 2016-07-02 DIAGNOSIS — M4806 Spinal stenosis, lumbar region: Secondary | ICD-10-CM | POA: Diagnosis not present

## 2016-07-13 DIAGNOSIS — M542 Cervicalgia: Secondary | ICD-10-CM | POA: Diagnosis not present

## 2016-08-09 DIAGNOSIS — H353132 Nonexudative age-related macular degeneration, bilateral, intermediate dry stage: Secondary | ICD-10-CM | POA: Diagnosis not present

## 2016-08-12 DIAGNOSIS — I739 Peripheral vascular disease, unspecified: Secondary | ICD-10-CM | POA: Diagnosis not present

## 2016-08-12 DIAGNOSIS — I10 Essential (primary) hypertension: Secondary | ICD-10-CM | POA: Diagnosis not present

## 2016-08-12 DIAGNOSIS — I2510 Atherosclerotic heart disease of native coronary artery without angina pectoris: Secondary | ICD-10-CM | POA: Diagnosis not present

## 2016-08-12 DIAGNOSIS — I872 Venous insufficiency (chronic) (peripheral): Secondary | ICD-10-CM | POA: Diagnosis not present

## 2016-08-12 DIAGNOSIS — M7989 Other specified soft tissue disorders: Secondary | ICD-10-CM | POA: Diagnosis not present

## 2016-08-12 DIAGNOSIS — I890 Lymphedema, not elsewhere classified: Secondary | ICD-10-CM | POA: Diagnosis not present

## 2016-08-12 DIAGNOSIS — E785 Hyperlipidemia, unspecified: Secondary | ICD-10-CM | POA: Diagnosis not present

## 2016-08-12 DIAGNOSIS — M79609 Pain in unspecified limb: Secondary | ICD-10-CM | POA: Diagnosis not present

## 2016-09-30 DIAGNOSIS — I2510 Atherosclerotic heart disease of native coronary artery without angina pectoris: Secondary | ICD-10-CM | POA: Diagnosis not present

## 2016-09-30 DIAGNOSIS — Z0000 Encounter for general adult medical examination without abnormal findings: Secondary | ICD-10-CM | POA: Diagnosis not present

## 2016-10-06 DIAGNOSIS — I639 Cerebral infarction, unspecified: Secondary | ICD-10-CM | POA: Diagnosis not present

## 2016-10-06 DIAGNOSIS — M5116 Intervertebral disc disorders with radiculopathy, lumbar region: Secondary | ICD-10-CM | POA: Diagnosis not present

## 2016-10-06 DIAGNOSIS — I2510 Atherosclerotic heart disease of native coronary artery without angina pectoris: Secondary | ICD-10-CM | POA: Diagnosis not present

## 2016-12-06 ENCOUNTER — Encounter: Payer: Self-pay | Admitting: Emergency Medicine

## 2016-12-06 ENCOUNTER — Emergency Department: Payer: MEDICARE

## 2016-12-06 ENCOUNTER — Inpatient Hospital Stay
Admission: EM | Admit: 2016-12-06 | Discharge: 2016-12-09 | DRG: 641 | Disposition: A | Discharge status: Home Health | Payer: MEDICARE | Attending: Internal Medicine | Admitting: Internal Medicine

## 2016-12-06 DIAGNOSIS — Z8673 Personal history of transient ischemic attack (TIA), and cerebral infarction without residual deficits: Secondary | ICD-10-CM | POA: Diagnosis not present

## 2016-12-06 DIAGNOSIS — I252 Old myocardial infarction: Secondary | ICD-10-CM

## 2016-12-06 DIAGNOSIS — Z96651 Presence of right artificial knee joint: Secondary | ICD-10-CM | POA: Diagnosis present

## 2016-12-06 DIAGNOSIS — Z79899 Other long term (current) drug therapy: Secondary | ICD-10-CM | POA: Diagnosis not present

## 2016-12-06 DIAGNOSIS — Z9849 Cataract extraction status, unspecified eye: Secondary | ICD-10-CM | POA: Diagnosis not present

## 2016-12-06 DIAGNOSIS — R296 Repeated falls: Secondary | ICD-10-CM | POA: Diagnosis present

## 2016-12-06 DIAGNOSIS — Z955 Presence of coronary angioplasty implant and graft: Secondary | ICD-10-CM | POA: Diagnosis not present

## 2016-12-06 DIAGNOSIS — I10 Essential (primary) hypertension: Secondary | ICD-10-CM | POA: Diagnosis not present

## 2016-12-06 DIAGNOSIS — I739 Peripheral vascular disease, unspecified: Secondary | ICD-10-CM | POA: Diagnosis not present

## 2016-12-06 DIAGNOSIS — Z881 Allergy status to other antibiotic agents status: Secondary | ICD-10-CM | POA: Diagnosis not present

## 2016-12-06 DIAGNOSIS — E861 Hypovolemia: Secondary | ICD-10-CM | POA: Diagnosis present

## 2016-12-06 DIAGNOSIS — E785 Hyperlipidemia, unspecified: Secondary | ICD-10-CM | POA: Diagnosis not present

## 2016-12-06 DIAGNOSIS — Z885 Allergy status to narcotic agent status: Secondary | ICD-10-CM

## 2016-12-06 DIAGNOSIS — K219 Gastro-esophageal reflux disease without esophagitis: Secondary | ICD-10-CM | POA: Diagnosis present

## 2016-12-06 DIAGNOSIS — Z87891 Personal history of nicotine dependence: Secondary | ICD-10-CM | POA: Diagnosis not present

## 2016-12-06 DIAGNOSIS — I2510 Atherosclerotic heart disease of native coronary artery without angina pectoris: Secondary | ICD-10-CM | POA: Diagnosis present

## 2016-12-06 DIAGNOSIS — Z951 Presence of aortocoronary bypass graft: Secondary | ICD-10-CM | POA: Diagnosis not present

## 2016-12-06 DIAGNOSIS — E871 Hypo-osmolality and hyponatremia: Secondary | ICD-10-CM | POA: Diagnosis present

## 2016-12-06 DIAGNOSIS — Z7982 Long term (current) use of aspirin: Secondary | ICD-10-CM | POA: Diagnosis not present

## 2016-12-06 DIAGNOSIS — R55 Syncope and collapse: Secondary | ICD-10-CM

## 2016-12-06 DIAGNOSIS — R531 Weakness: Secondary | ICD-10-CM

## 2016-12-06 DIAGNOSIS — Z888 Allergy status to other drugs, medicaments and biological substances status: Secondary | ICD-10-CM

## 2016-12-06 DIAGNOSIS — R2681 Unsteadiness on feet: Secondary | ICD-10-CM

## 2016-12-06 LAB — BASIC METABOLIC PANEL
ANION GAP: 8 (ref 5–15)
BUN: 18 mg/dL (ref 6–20)
CO2: 23 mmol/L (ref 22–32)
Calcium: 8.9 mg/dL (ref 8.9–10.3)
Chloride: 93 mmol/L — ABNORMAL LOW (ref 101–111)
Creatinine, Ser: 1.26 mg/dL — ABNORMAL HIGH (ref 0.61–1.24)
GFR calc non Af Amer: 51 mL/min — ABNORMAL LOW (ref >60)
GFR, EST AFRICAN AMERICAN: 60 mL/min — AB (ref >60)
GLUCOSE: 113 mg/dL — AB (ref 65–99)
POTASSIUM: 4.4 mmol/L (ref 3.5–5.1)
Sodium: 124 mmol/L — ABNORMAL LOW (ref 135–145)

## 2016-12-06 LAB — CBC
HEMATOCRIT: 32.5 % — AB (ref 40.0–52.0)
HEMOGLOBIN: 11.6 g/dL — AB (ref 13.0–18.0)
MCH: 33.5 pg (ref 26.0–34.0)
MCHC: 35.6 g/dL (ref 32.0–36.0)
MCV: 94 fL (ref 80.0–100.0)
Platelets: 182 10*3/uL (ref 150–440)
RBC: 3.46 MIL/uL — AB (ref 4.40–5.90)
RDW: 13.2 % (ref 11.5–14.5)
WBC: 3.3 10*3/uL — ABNORMAL LOW (ref 3.8–10.6)

## 2016-12-06 LAB — TROPONIN I: Troponin I: <0.03 ng/mL (ref <0.03)

## 2016-12-06 MED ORDER — ONDANSETRON HCL 4 MG/2ML IJ SOLN: 4.0000 mg | Freq: Four times a day (QID) | INTRAMUSCULAR | Status: DC | PRN

## 2016-12-06 MED ORDER — PANTOPRAZOLE SODIUM 40 MG PO TBEC
40.0000 mg | DELAYED_RELEASE_TABLET | Freq: Every day | ORAL | Status: DC
  Administered 2016-12-06 – 2016-12-09 (×4): 40 mg via ORAL
  Filled 2016-12-06 (×4): qty 1

## 2016-12-06 MED ORDER — SIMVASTATIN 20 MG PO TABS
10.0000 mg | ORAL_TABLET | Freq: Every day | ORAL | Status: DC
  Administered 2016-12-06 – 2016-12-08 (×3): 10 mg via ORAL
  Filled 2016-12-06: qty 2
  Filled 2016-12-06 (×2): qty 1

## 2016-12-06 MED ORDER — AMLODIPINE BESYLATE 5 MG PO TABS
5.0000 mg | ORAL_TABLET | Freq: Every day | ORAL | Status: DC
  Administered 2016-12-06 – 2016-12-09 (×4): 5 mg via ORAL
  Filled 2016-12-06 (×4): qty 1

## 2016-12-06 MED ORDER — ACETAMINOPHEN 650 MG RE SUPP: 650.0000 mg | Freq: Four times a day (QID) | RECTAL | Status: DC | PRN

## 2016-12-06 MED ORDER — METOPROLOL TARTRATE 25 MG PO TABS
12.5000 mg | ORAL_TABLET | Freq: Two times a day (BID) | ORAL | Status: DC
  Administered 2016-12-07 – 2016-12-09 (×5): 12.5 mg via ORAL
  Filled 2016-12-06 (×6): qty 1

## 2016-12-06 MED ORDER — SODIUM CHLORIDE 0.9 % IV BOLUS (SEPSIS)
1000.0000 mL | Freq: Once | INTRAVENOUS | Status: AC
  Administered 2016-12-06: 1000 mL via INTRAVENOUS

## 2016-12-06 MED ORDER — ONDANSETRON HCL 4 MG PO TABS: 4.0000 mg | ORAL_TABLET | Freq: Four times a day (QID) | ORAL | Status: DC | PRN

## 2016-12-06 MED ORDER — ENOXAPARIN SODIUM 40 MG/0.4ML ~~LOC~~ SOLN
40.0000 mg | SUBCUTANEOUS | Status: DC
  Administered 2016-12-06 – 2016-12-08 (×3): 40 mg via SUBCUTANEOUS
  Filled 2016-12-06 (×3): qty 0.4

## 2016-12-06 MED ORDER — SODIUM CHLORIDE 0.9 % IV SOLN
INTRAVENOUS | Status: DC
  Administered 2016-12-06 – 2016-12-08 (×4): via INTRAVENOUS

## 2016-12-06 MED ORDER — ACETAMINOPHEN 325 MG PO TABS: 650.0000 mg | ORAL_TABLET | Freq: Four times a day (QID) | ORAL | Status: DC | PRN

## 2016-12-06 MED ORDER — IBUPROFEN 400 MG PO TABS: 200.0000 mg | ORAL_TABLET | Freq: Four times a day (QID) | ORAL | Status: DC | PRN

## 2016-12-06 MED ORDER — ASPIRIN EC 81 MG PO TBEC
325.0000 mg | DELAYED_RELEASE_TABLET | Freq: Every day | ORAL | Status: DC
  Administered 2016-12-07 – 2016-12-09 (×3): 325 mg via ORAL
  Filled 2016-12-06 (×3): qty 4

## 2016-12-06 MED ORDER — VITAMIN C 500 MG PO TABS
500.0000 mg | ORAL_TABLET | Freq: Every day | ORAL | Status: DC
  Administered 2016-12-06 – 2016-12-09 (×4): 500 mg via ORAL
  Filled 2016-12-06 (×4): qty 1

## 2016-12-06 NOTE — Plan of Care (Signed)
Problem: Safety: Goal: Ability to remain free from injury will improve Outcome: Progressing Fall precautions in place  Problem: Pain Managment: Goal: General experience of comfort will improve Outcome: Progressing Prn medications  Problem: Tissue Perfusion: Goal: Risk factors for ineffective tissue perfusion will decrease Outcome: Progressing SQ Lovenox     

## 2016-12-06 NOTE — ED Notes (Addendum)
Dr lord at bedside 

## 2016-12-06 NOTE — H&P (Signed)
[employer] - [place] at [facility]   PATIENT NAME: Bobby [name]    MR#:  [id number]  DATE OF BIRTH:  [birth year]  DATE OF ADMISSION:  12/06/2016  PRIMARY CARE PHYSICIAN: Danella Penton, MD   REQUESTING/REFERRING PHYSICIAN:  Governor Rooks MD  CHIEF COMPLAINT:   Chief Complaint  Patient presents with  . Weakness    HISTORY OF PRESENT ILLNESS: Bobby [name]  is a 83 y.o. male with a known history of   Coronary artery disease with 2 stents, GERD, essential hypertension, peripheral vascular disease who is presenting to the hospital with multiple falls. Patient had a fall 3-4 weeks ago hit his head. He did not seek any medical attention. Since then his had falls recurrently. He was seen by primary care provider last week and was told that he needed to drink more fluid. Patient has not been eating or drinking much. Denies any chest pain or shortness of breath no nausea vomiting or diarrhea    PAST MEDICAL HISTORY:   Past Medical History:  Diagnosis Date  . Arthritis    (R)TKR 5/13 [facility]  . Coronary artery disease    2 stents  . GERD (gastroesophageal reflux disease)   . Myocardial infarction    NSTEMI 11/11/12 @ [facility]  . Peripheral vascular disease [facility])    (L)CEA 2007 [place]  . PONV (postoperative nausea and vomiting)     PAST SURGICAL HISTORY: Past Surgical History:  Procedure Laterality Date  . CARDIAC CATHETERIZATION     11/11/12 [facility],2000,2006 w/ stents  . CATARACT EXTRACTION    . CORONARY ARTERY BYPASS GRAFT  11/16/2012   Procedure: CORONARY ARTERY BYPASS GRAFTING (CABG);  Surgeon: Kerin Perna, MD;  Location: [facility] OR;  Service: Open Heart Surgery;  Laterality: N/A;  times four using Bilateral Greater Saphenous Vein Graft harvested endoscopically from bilateral thighs, and Left Internal Mammary Artery.  Marland Kitchen HERNIA REPAIR  01/17/13   Dr Renda Rolls @ [facility]  . INTRAOPERATIVE TRANSESOPHAGEAL ECHOCARDIOGRAM  11/16/2012   Procedure: INTRAOPERATIVE  TRANSESOPHAGEAL ECHOCARDIOGRAM;  Surgeon: Kerin Perna, MD;  Location: [facility] OR;  Service: Open Heart Surgery;  Laterality: N/A;  . JOINT REPLACEMENT     (R)TKR 5/13 [facility]  . VASCULAR SURGERY     (R)TKR 5/13 [facility]    SOCIAL HISTORY:  Social History  Substance Use Topics  . Smoking status: Former Smoker    Types: Cigars    Quit date: 10/22/1989  . Smokeless tobacco: Never Used  . Alcohol use No    FAMILY HISTORY: History reviewed. No pertinent family history.  DRUG ALLERGIES:  Allergies  Allergen Reactions  . Percocet [Oxycodone-Acetaminophen]     Hallucinations   . Septra [Sulfamethoxazole-Trimethoprim]   . Tramadol Hcl Other (See Comments)    Hallucinations    REVIEW OF SYSTEMS:   CONSTITUTIONAL: No fever, Positive fatigue positive weakness.  EYES: No blurred or double vision.  EARS, NOSE, AND THROAT: No tinnitus or ear pain.  RESPIRATORY: No cough, shortness of breath, wheezing or hemoptysis.  CARDIOVASCULAR: No chest pain, orthopnea, edema.  GASTROINTESTINAL: No nausea, vomiting, diarrhea or abdominal pain.  GENITOURINARY: No dysuria, hematuria.  ENDOCRINE: No polyuria, nocturia,  HEMATOLOGY: No anemia, easy bruising or bleeding SKIN: No rash or lesion. MUSCULOSKELETAL: No joint pain or arthritis.   NEUROLOGIC: No tingling, numbness, weakness.  PSYCHIATRY: No anxiety or depression.   MEDICATIONS AT HOME:  Prior to Admission medications   Medication Sig Start Date End Date Taking? Authorizing Provider  aspirin 81  MG tablet Take 325 mg by mouth daily. For pain   Yes Historical Provider, MD  Bee Pollen 1000 MG TABS Take 2,000 mg by mouth 2 (two) times daily.   Yes Historical Provider, MD  Cholecalciferol (VITAMIN D3) 1000 units CAPS Take 1,000 Units by mouth daily.   Yes Historical Provider, MD  ferrous sulfate 325 (65 FE) MG tablet Take 325 mg by mouth every Monday, Wednesday, and Friday.   Yes Historical Provider, MD  hydrochlorothiazide (HYDRODIURIL) 12.5 MG  tablet Take 12.5 mg by mouth daily.   Yes Historical Provider, MD  metoprolol tartrate (LOPRESSOR) 25 MG tablet Take 0.5 tablets (12.5 mg total) by mouth 2 (two) times daily. 11/21/12  Yes Erin R Barrett, PA-C  Multiple Vitamins-Minerals (OCUVITE PRESERVISION PO) Take 1 tablet by mouth 2 (two) times daily.   Yes Historical Provider, MD  omeprazole (PRILOSEC) 20 MG capsule Take 20 mg by mouth daily.  11/28/12  Yes Historical Provider, MD  senna-docusate (SENOKOT-S) 8.6-50 MG per tablet Take 1 tablet by mouth daily as needed for constipation.   Yes Historical Provider, MD  simvastatin (ZOCOR) 20 MG tablet Take 10 mg by mouth at bedtime.   Yes Historical Provider, MD  vitamin C (ASCORBIC ACID) 500 MG tablet Take 500 mg by mouth daily.   Yes Historical Provider, MD      PHYSICAL EXAMINATION:   VITAL SIGNS: Blood pressure (!) 138/58, pulse (!) 54, temperature 97.5 F (36.4 C), temperature source Oral, resp. rate 14, weight 180 lb (81.6 kg), SpO2 100 %.  GENERAL:  83 y.o.-year-old patient lying in the bed with no acute distress.  EYES: Pupils equal, round, reactive to light and accommodation. No scleral icterus. Extraocular muscles intact.  HEENT: Head atraumatic, normocephalic. Oropharynx and nasopharynx clear.  NECK:  Supple, no jugular venous distention. No thyroid enlargement, no tenderness.  LUNGS: Normal breath sounds bilaterally, no wheezing, rales,rhonchi or crepitation. No use of accessory muscles of respiration.  CARDIOVASCULAR: S1, S2 normal. No murmurs, rubs, or gallops.  ABDOMEN: Soft, nontender, nondistended. Bowel sounds present. No organomegaly or mass.  EXTREMITIES: No pedal edema, cyanosis, or clubbing.  NEUROLOGIC: Cranial nerves II through XII are intact. Muscle strength 5/5 in all extremities. Sensation intact. Gait not checked.  PSYCHIATRIC: The patient is alert and oriented x 3.  SKIN: No obvious rash, lesion, or ulcer.   LABORATORY PANEL:   CBC  Recent Labs Lab  12/06/16 1140  WBC 3.3*  HGB 11.6*  HCT 32.5*  PLT 182  MCV 94.0  MCH 33.5  MCHC 35.6  RDW 13.2   ------------------------------------------------------------------------------------------------------------------  Chemistries   Recent Labs Lab 12/06/16 1140  NA 124*  K 4.4  CL 93*  CO2 23  GLUCOSE 113*  BUN 18  CREATININE 1.26*  CALCIUM 8.9   ------------------------------------------------------------------------------------------------------------------ CrCl cannot be calculated (Unknown ideal weight.). ------------------------------------------------------------------------------------------------------------------ No results for input(s): TSH, T4TOTAL, T3FREE, THYROIDAB in the last 72 hours.  Invalid input(s): FREET3   Coagulation profile No results for input(s): INR, PROTIME in the last 168 hours. ------------------------------------------------------------------------------------------------------------------- No results for input(s): DDIMER in the last 72 hours. -------------------------------------------------------------------------------------------------------------------  Cardiac Enzymes  Recent Labs Lab 12/06/16 1140  TROPONINI <0.03   ------------------------------------------------------------------------------------------------------------------ Invalid input(s): POCBNP  ---------------------------------------------------------------------------------------------------------------  Urinalysis    Component Value Date/Time   COLORURINE Yellow 01/20/2014 2246   COLORURINE YELLOW 11/14/2012 1821   APPEARANCEUR Clear 01/20/2014 2246   LABSPEC 1.011 01/20/2014 2246   PHURINE 6.0 01/20/2014 2246   PHURINE 6.5 11/14/2012 1821   GLUCOSEU Negative 01/20/2014  2246   HGBUR Negative 01/20/2014 2246   HGBUR NEGATIVE 11/14/2012 1821   BILIRUBINUR Negative 01/20/2014 2246   KETONESUR Negative 01/20/2014 2246   KETONESUR NEGATIVE 11/14/2012 1821    PROTEINUR Negative 01/20/2014 2246   PROTEINUR NEGATIVE 11/14/2012 1821   UROBILINOGEN 0.2 11/14/2012 1821   NITRITE Negative 01/20/2014 2246   NITRITE NEGATIVE 11/14/2012 1821   LEUKOCYTESUR Negative 01/20/2014 2246     RADIOLOGY: Ct Head Wo Contrast  Result Date: 12/06/2016 CLINICAL DATA:  Weakness and syncope. EXAM: CT HEAD WITHOUT CONTRAST TECHNIQUE: Contiguous axial images were obtained from the base of the skull through the vertex without intravenous contrast. COMPARISON:  01/21/2014 MRI, 01/20/2014 CT FINDINGS: Brain: Chronic focus of right cerebellar encephalomalacia from chronic infarct. Chronic moderate degree of periventricular white matter areas of hypoattenuation consistent with small vessel ischemia. Chronic bilateral basal ganglial lacunar infarcts. No intra-axial mass, mass effect, shift of midline structures nor acute hemorrhage. Stable mild sulcal and mild to moderate ventricular prominence. No effacement of the basal cisterns. Vascular: No hyperdense vessel. Moderate atherosclerosis of the carotid siphons and left vertebral artery. Skull: Nonacute Sinuses/Orbits: Clear mastoids. Mild ethmoid sinus mucosal thickening. Frontal sinus is developmentally underpneumatized. Other: None IMPRESSION: Chronic small vessel ischemic disease of periventricular white matter. No acute intracranial abnormality. Chronic inferior right cerebellar infarct. Electronically Signed   By: Tollie Eth M.D.   On: 12/06/2016 14:26    EKG: Orders placed or performed during the hospital encounter of 12/06/16  . ED EKG  . ED EKG  . EKG 12-Lead  . EKG 12-Lead    IMPRESSION AND PLAN: Patient is a 83 year old presenting with weakness and falls  1. Hyponatremia We'll treat with IV fluids Discontinue HCTZ  2. Essential hypertension I will stop HCTZ start patient on Norvasc  3. Coronary artery disease continue therapy with aspirin and metoprolol  4. GERD we'll continue omeprazole  5. Hyperlipidemia  unspecified continue Zocor  6. Miscellaneous Lovenox for DVT prophylaxis  All the records are reviewed and case discussed with ED provider. Management plans discussed with the patient, family and they are in agreement.  CODE STATUS: Code Status History    Date Active Date Inactive Code Status Order ID Comments User Context   11/14/2012  5:37 PM 11/16/2012  1:56 PM Full Code [id number]  Ardelle Balls, PA Inpatient       TOTAL TIME TAKING CARE OF THIS PATIENT27minutes.    Auburn Bilberry M.D on 12/06/2016 at 3:07 PM  Between 7am to 6pm - Pager - [phone]  After 6pm go to [email or web address] - password [email or web address] [facility]  [facility]  Office  [phone]  CC: Primary care physician; Danella Penton, MD

## 2016-12-06 NOTE — ED Notes (Signed)
Family at bedside. 

## 2016-12-06 NOTE — ED Notes (Addendum)
Pt family at bedside and states that pt fell in the bathtub about 1 week ago. Pt denies loss of consciousness.

## 2016-12-06 NOTE — ED Notes (Signed)
Admitting md at bedside

## 2016-12-06 NOTE — ED Triage Notes (Signed)
Pt to ed via ems from home with c/o weakness this am and syncopal episode.  Pt states he was getting his breakfast and just passed out.  Pt denies head injury.  Pt alert and oriented.  Pt states "my wife caught me before I hit the floor"  Pt denies pain to any part of the body. Pt alert and oriented at this time. Denies dizziness, denies weakness while laying.   States he has not been sleeping well,  Reports cough, congestion and fever intermittently.  Skin warm and dry at this time.

## 2016-12-06 NOTE — ED Provider Notes (Signed)
[facility] Emergency Department Provider Note ____________________________________________   I have reviewed the triage vital signs and the triage nursing note.  HISTORY  Chief Complaint Weakness   Historian Patient and daughter  HPI Bobby [name] is a 83 y.o. male who lives with his daughter, presents after witnessed syncope today. Family states he's had several falls, and at least to this seemed to be associated with syncope where his eyes were rolled back. No trauma reported today, family members were able to catch him. He's not been complaining of shortness of breath, dyspnea on exertion, fevers, congestion, chest pain, palpitations, or dizziness.  Family states that he does "not want to get fat" and so he hasn't been eating and drinking very much.    Past Medical History:  Diagnosis Date  . Arthritis    (R)TKR 5/13 [facility]  . Coronary artery disease    2 stents  . GERD (gastroesophageal reflux disease)   . Myocardial infarction    NSTEMI 11/11/12 @ [facility]  . Peripheral vascular disease [facility](HCC)    (L)CEA 2007 [place]  . PONV (postoperative nausea and vomiting)     Patient Active Problem List   Diagnosis Date Noted  . Inguinal hernia with history of incarceration-left 12/20/2012  . Coronary artery disease 11/21/2012  . Carotid artery disease (HCC) 11/21/2012  . S/P carotid endarterectomy 11/21/2012  . Stented coronary artery 11/21/2012  . S/P CABG x 4 11/21/2012    Past Surgical History:  Procedure Laterality Date  . CARDIAC CATHETERIZATION     11/11/12 [facility],2000,2006 w/ stents  . CATARACT EXTRACTION    . CORONARY ARTERY BYPASS GRAFT  11/16/2012   Procedure: CORONARY ARTERY BYPASS GRAFTING (CABG);  Surgeon: Kerin PernaPeter Van Trigt, MD;  [facility];  Service: Open Heart Surgery;  Laterality: N/A;  times four using Bilateral Greater Saphenous Vein Graft harvested endoscopically from bilateral thighs, and Left Internal Mammary Artery.  Marland Kitchen. HERNIA  REPAIR  01/17/13   Dr Renda RollsWilton Smith [facility]  . INTRAOPERATIVE TRANSESOPHAGEAL ECHOCARDIOGRAM  11/16/2012   Procedure: INTRAOPERATIVE TRANSESOPHAGEAL ECHOCARDIOGRAM;  Surgeon: Kerin PernaPeter Van Trigt, MD;  [facility];  Service: Open Heart Surgery;  Laterality: N/A;  . JOINT REPLACEMENT     (R)TKR 5/13 [facility]  . VASCULAR SURGERY     (R)TKR 5/13 [facility]    Prior to Admission medications   Medication Sig Start Date End Date Taking? Authorizing Provider  aspirin 81 MG tablet Take 325 mg by mouth daily. For pain   Yes Historical Provider, MD  Bee Pollen 1000 MG TABS Take 2,000 mg by mouth 2 (two) times daily.   Yes Historical Provider, MD  Cholecalciferol (VITAMIN D3) 1000 units CAPS Take 1,000 Units by mouth daily.   Yes Historical Provider, MD  ferrous sulfate 325 (65 FE) MG tablet Take 325 mg by mouth every Monday, Wednesday, and Friday.   Yes Historical Provider, MD  hydrochlorothiazide (HYDRODIURIL) 12.5 MG tablet Take 12.5 mg by mouth daily.   Yes Historical Provider, MD  metoprolol tartrate (LOPRESSOR) 25 MG tablet Take 0.5 tablets (12.5 mg total) by mouth 2 (two) times daily. 11/21/12  Yes Erin R Barrett, PA-C  Multiple Vitamins-Minerals (OCUVITE PRESERVISION PO) Take 1 tablet by mouth 2 (two) times daily.   Yes Historical Provider, MD  omeprazole (PRILOSEC) 20 MG capsule Take 20 mg by mouth daily.  11/28/12  Yes Historical Provider, MD  senna-docusate (SENOKOT-S) 8.6-50 MG per tablet Take 1 tablet by mouth daily as needed for constipation.  Yes Historical Provider, MD  simvastatin (ZOCOR) 20 MG tablet Take 10 mg by mouth at bedtime.   Yes Historical Provider, MD  vitamin C (ASCORBIC ACID) 500 MG tablet Take 500 mg by mouth daily.   Yes Historical Provider, MD    Allergies  Allergen Reactions  . Percocet [Oxycodone-Acetaminophen]     Hallucinations   . Septra [Sulfamethoxazole-Trimethoprim]   . Tramadol Hcl Other (See Comments)    Hallucinations    History reviewed. No pertinent family  history.  Social History Social History  Substance Use Topics  . Smoking status: Former Smoker    Types: Cigars    Quit date: 10/22/1989  . Smokeless tobacco: Never Used  . Alcohol use No    Review of Systems  Constitutional: Negative for fever. Eyes: Negative for visual changes. ENT: Negative for sore throat. Cardiovascular: Negative for chest pain. Respiratory: Negative for shortness of breath. Gastrointestinal: Negative for abdominal pain, vomiting and diarrhea. Genitourinary: Negative for dysuria. Musculoskeletal: Negative for back pain. Skin: Negative for rash. Neurological: Negative for headache. 10 point Review of Systems otherwise negative ____________________________________________   PHYSICAL EXAM:  VITAL SIGNS: ED Triage Vitals  Enc Vitals Group     BP 12/06/16 1130 (!) 151/63     Pulse Rate 12/06/16 1137 (!) 56     Resp 12/06/16 1130 15     Temp 12/06/16 1137 97.5 F (36.4 C)     Temp Source 12/06/16 1137 Oral     SpO2 12/06/16 1137 100 %     Weight 12/06/16 1138 180 lb (81.6 kg)     Height --      Head Circumference --      Peak Flow --      Pain Score --      Pain Loc --      Pain Edu? --      Excl. in GC? --      Constitutional: Alert and oriented. Well appearing and in no distress. HEENT   Head: Normocephalic and atraumatic.      Eyes: Conjunctivae are normal. PERRL. Normal extraocular movements.      Ears:         Nose: No congestion/rhinnorhea.   Mouth/Throat: Mucous membranes are Mildly dry.   Neck: No stridor. Cardiovascular/Chest: Normal rate, regular rhythm.  No murmurs, rubs, or gallops. Respiratory: Normal respiratory effort without tachypnea nor retractions. Breath sounds are clear and equal bilaterally. No wheezes/rales/rhonchi. Gastrointestinal: Soft. No distention, no guarding, no rebound. Nontender.    Genitourinary/rectal:Deferred Musculoskeletal: Nontender with normal range of motion in all extremities. No joint  effusions.  No lower extremity tenderness.  No edema. Neurologic:  Normal speech and language. No gross or focal neurologic deficits are appreciated. Skin:  Skin is warm, dry and intact. No rash noted. Psychiatric: Mood and affect are normal. Speech and behavior are normal. Patient exhibits appropriate insight and judgment.   ____________________________________________  LABS (pertinent positives/negatives)  Labs Reviewed  BASIC METABOLIC PANEL - Abnormal; Notable for the following:       Result Value   Sodium 124 (*)    Chloride 93 (*)    Glucose, Bld 113 (*)    Creatinine, Ser 1.26 (*)    GFR calc non Af Amer 51 (*)    GFR calc Af Amer 60 (*)    All other components within normal limits  CBC - Abnormal; Notable for the following:    WBC 3.3 (*)    RBC 3.46 (*)    Hemoglobin 11.6 (*)  HCT 32.5 (*)    All other components within normal limits  URINALYSIS, COMPLETE (UACMP) WITH MICROSCOPIC  TROPONIN I    ____________________________________________    EKG I, Governor Rooks, MD, the attending physician have personally viewed and interpreted all ECGs.  56 bpm. Somewhat disturbed baseline, but appears to be normal sinus rhythm. Right bundle branch block. Nonspecific ST and T-wave. ____________________________________________  RADIOLOGY All Xrays were viewed by me. Imaging interpreted by Radiologist.  CT head without contrast:  IMPRESSION: Chronic small vessel ischemic disease of periventricular white matter. No acute intracranial abnormality. Chronic inferior right cerebellar infarct. __________________________________________  PROCEDURES  Procedure(s) performed: None  Critical Care performed: None  ____________________________________________   ED COURSE / ASSESSMENT AND PLAN  Pertinent labs & imaging results that were available during my care of the patient were reviewed by me and considered in my medical decision making (see chart for details).  No  reported traumatic injury from either today's syncope or multiple previous falls in the last month or so.  Given recent falls, and syncope 8, although no focal neurologic deficits now I am going to obtain CT head imaging.  Laboratory studies show significant hyponatremia. Normal saline bolus initiated. Episode today did not sound like seizure, did sound more like syncope.  EKG shows sinus bradycardia, I will add on troponin to nurse driven laboratory protocol on arrival.  Given generalized weakness with the severity of hyponatremia, and addition to the syncope, will admit to the hospitalist service for further management and workup.   Troponin pending at time of Hospital is consultation.    CONSULTATIONS:  Hospitalist for admission.   Patient / Family / Caregiver informed of clinical course, medical decision-making process, and agree with plan.   ___________________________________________   FINAL CLINICAL IMPRESSION(S) / ED DIAGNOSES   Final diagnoses:  Weakness  Hyponatremia  Syncope, unspecified syncope type              Note: This dictation was prepared with Dragon dictation. Any transcriptional errors that result from this process are unintentional    Governor Rooks, MD 12/06/16 1433

## 2016-12-06 NOTE — Progress Notes (Signed)
83 yo wm admitted to room 233 via stretcher from ED with hyponatremia.  A&O x 3.  Gait unsteady at this time.  No distress on ra.  Cardiac monitor placed and verified with Thayer Ohmhris, CNA.  Denies chest pain at this time.  Lungs diminished bil.  SL lt ac flushes well.  Skin intact and verified with Dorna MaiJancy, RN.  Oriented to room and surroundings, POC reviewed with pt and wife.  Denies need at this time.  CB in reach, SR up x 2, bed alarm on.

## 2016-12-07 DIAGNOSIS — E871 Hypo-osmolality and hyponatremia: Secondary | ICD-10-CM | POA: Diagnosis not present

## 2016-12-07 LAB — URINALYSIS, COMPLETE (UACMP) WITH MICROSCOPIC
BACTERIA UA: NONE SEEN
BILIRUBIN URINE: NEGATIVE
GLUCOSE, UA: NEGATIVE mg/dL
HGB URINE DIPSTICK: NEGATIVE
KETONES UR: NEGATIVE mg/dL
Leukocytes, UA: NEGATIVE
NITRITE: NEGATIVE
PROTEIN: NEGATIVE mg/dL
RBC / HPF: NONE SEEN RBC/hpf (ref 0–5)
Specific Gravity, Urine: 1.008 (ref 1.005–1.030)
Squamous Epithelial / LPF: NONE SEEN
WBC UA: NONE SEEN WBC/hpf (ref 0–5)
pH: 6 (ref 5.0–8.0)

## 2016-12-07 LAB — CBC
HEMATOCRIT: 28.7 % — AB (ref 40.0–52.0)
HEMOGLOBIN: 10.3 g/dL — AB (ref 13.0–18.0)
MCH: 33.7 pg (ref 26.0–34.0)
MCHC: 36 g/dL (ref 32.0–36.0)
MCV: 93.4 fL (ref 80.0–100.0)
Platelets: 161 10*3/uL (ref 150–440)
RBC: 3.07 MIL/uL — AB (ref 4.40–5.90)
RDW: 13.2 % (ref 11.5–14.5)
WBC: 3.2 10*3/uL — ABNORMAL LOW (ref 3.8–10.6)

## 2016-12-07 LAB — BASIC METABOLIC PANEL
Anion gap: 8 (ref 5–15)
BUN: 21 mg/dL — AB (ref 6–20)
CHLORIDE: 99 mmol/L — AB (ref 101–111)
CO2: 21 mmol/L — AB (ref 22–32)
Calcium: 8.2 mg/dL — ABNORMAL LOW (ref 8.9–10.3)
Creatinine, Ser: 1.12 mg/dL (ref 0.61–1.24)
GFR calc Af Amer: >60 mL/min (ref >60)
GFR calc non Af Amer: 59 mL/min — ABNORMAL LOW (ref >60)
Glucose, Bld: 112 mg/dL — ABNORMAL HIGH (ref 65–99)
POTASSIUM: 4.3 mmol/L (ref 3.5–5.1)
SODIUM: 128 mmol/L — AB (ref 135–145)

## 2016-12-07 NOTE — Progress Notes (Signed)
[employer] [facility] [facility] [place] [facility]   PATIENT NAME: Bobby [name]    MR#:  [id number]  DATE OF BIRTH:  06-16-1932  SUBJECTIVE:  CHIEF COMPLAINT:   Chief Complaint  Patient presents with  . Weakness   - feels much better - sodium improving with fluids  REVIEW OF SYSTEMS:  Review of Systems  Constitutional: Negative for chills, fever and malaise/fatigue.  HENT: Negative for ear discharge, ear pain, hearing loss and nosebleeds.   Eyes: Negative for blurred vision, double vision and photophobia.  Respiratory: Negative for cough, hemoptysis, shortness of breath and wheezing.   Cardiovascular: Negative for chest pain, palpitations, orthopnea and leg swelling.  Gastrointestinal: Negative for abdominal pain, constipation, diarrhea, melena, nausea and vomiting.  Genitourinary: Negative for dysuria.  Musculoskeletal: Negative for back pain, myalgias and neck pain.  Skin: Negative for rash.  Neurological: Positive for dizziness. Negative for tingling, tremors, sensory change, speech change, focal weakness and headaches.  Endo/Heme/Allergies: Does not bruise/bleed easily.  Psychiatric/Behavioral: Negative for depression.    DRUG ALLERGIES:   Allergies  Allergen Reactions  . Percocet [Oxycodone-Acetaminophen]     Hallucinations   . Septra [Sulfamethoxazole-Trimethoprim]   . Tramadol Hcl Other (See Comments)    Hallucinations    VITALS:  Blood pressure (!) 149/68, pulse (!) 59, temperature 97.4 F (36.3 C), temperature source Oral, resp. rate 16, height 5\' 8"  (1.727 m), weight 68.9 kg (151 lb 14.4 oz), SpO2 98 %.  PHYSICAL EXAMINATION:  Physical Exam  GENERAL:  83 y.o.-year-old patient lying in the bed with no acute distress.  EYES: Pupils equal, round, reactive to light and accommodation. No scleral icterus. Extraocular muscles intact.  HEENT: Head atraumatic, normocephalic. Oropharynx and nasopharynx clear.  NECK:  Supple, no jugular venous distention. No  thyroid enlargement, no tenderness.  LUNGS: Normal breath sounds bilaterally, no wheezing, rales,rhonchi or crepitation. No use of accessory muscles of respiration.  CARDIOVASCULAR: S1, S2 normal. No rubs, or gallops. 2/6 systolic murmur present. ABDOMEN: Soft, nontender, nondistended. Bowel sounds present. No organomegaly or mass.  EXTREMITIES: No pedal edema, cyanosis, or clubbing.  NEUROLOGIC: Cranial nerves II through XII are intact. Muscle strength 5/5 in all extremities. Sensation intact. Gait not checked.  PSYCHIATRIC: The patient is alert and oriented x 3.  SKIN: No obvious rash, lesion, or ulcer.    LABORATORY PANEL:   CBC  Recent Labs Lab 12/07/16 0325  WBC 3.2*  HGB 10.3*  HCT 28.7*  PLT 161   ------------------------------------------------------------------------------------------------------------------  Chemistries   Recent Labs Lab 12/07/16 0325  NA 128*  K 4.3  CL 99*  CO2 21*  GLUCOSE 112*  BUN 21*  CREATININE 1.12  CALCIUM 8.2*   ------------------------------------------------------------------------------------------------------------------  Cardiac Enzymes  Recent Labs Lab 12/06/16 1140  TROPONINI <0.03   ------------------------------------------------------------------------------------------------------------------  RADIOLOGY:  Ct Head Wo Contrast  Result Date: 12/06/2016 CLINICAL DATA:  Weakness and syncope. EXAM: CT HEAD WITHOUT CONTRAST TECHNIQUE: Contiguous axial images were obtained from the base of the skull through the vertex without intravenous contrast. COMPARISON:  01/21/2014 MRI, 01/20/2014 CT FINDINGS: Brain: Chronic focus of right cerebellar encephalomalacia from chronic infarct. Chronic moderate degree of periventricular white matter areas of hypoattenuation consistent with small vessel ischemia. Chronic bilateral basal ganglial lacunar infarcts. No intra-axial mass, mass effect, shift of midline structures nor acute  hemorrhage. Stable mild sulcal and mild to moderate ventricular prominence. No effacement of the basal cisterns. Vascular: No hyperdense vessel. Moderate atherosclerosis of the carotid siphons and left vertebral artery. Skull: Nonacute  Sinuses/Orbits: Clear mastoids. Mild ethmoid sinus mucosal thickening. Frontal sinus is developmentally underpneumatized. Other: None IMPRESSION: Chronic small vessel ischemic disease of periventricular white matter. No acute intracranial abnormality. Chronic inferior right cerebellar infarct. Electronically Signed   By: Tollie Eth M.D.   On: 12/06/2016 14:26    EKG:   Orders placed or performed during the hospital encounter of 12/06/16  . ED EKG  . ED EKG  . EKG 12-Lead  . EKG 12-Lead    ASSESSMENT AND PLAN:   84y/o M with PMH of CAD s/p stents, GERD, carotid stenosis, PVD, remote CVA, arthirits presents secondary to weakness and falls  #1 Hyponatremia- hypovolemic - IV fluids, improving - discontinue HCTZ - monitor until >130 - physical therapy consult for falls - CT head with no acute findings  #2 HTN- on norvasc, metoprolol. - HCTZ discontinued  #3 CAD s/p stents-stable, on asa, statin and metoprolol  #4 GERD-on PPI  #5 DVT prophylaxis- lovenox  Physical Therapy consulted.    All the records are reviewed and case discussed with Care Management/Social Workerr. Management plans discussed with the patient, family and they are in agreement.  CODE STATUS: Full Code  TOTAL TIME TAKING CARE OF THIS PATIENT: 37 minutes.   POSSIBLE D/C TOMORROW, DEPENDING ON CLINICAL CONDITION.   Enid Baas M.D on 12/07/2016 at 10:31 AM  Between 7am to 6pm - Pager - [phone]  After 6pm go to [email or web address] - Social research officer, government  [facility]  Office  [phone]  CC: Primary care physician; Danella Penton, MD

## 2016-12-07 NOTE — Plan of Care (Signed)
Problem: Safety: Goal: Ability to remain free from injury will improve Outcome: Progressing Fall precautions in place  Problem: Pain Managment: Goal: General experience of comfort will improve Outcome: Progressing Prn medications  Problem: Tissue Perfusion: Goal: Risk factors for ineffective tissue perfusion will decrease Outcome: Progressing SQ Lovenox   Problem: Activity: Goal: Risk for activity intolerance will decrease Outcome: Progressing PT evaluation today

## 2016-12-07 NOTE — Care Management (Addendum)
Presented from home with weakness and fall. Receiving IVF for hyponatremia and dehydration. Independent in all adls, denies issues accessing medical care, obtaining medications or with transportation.  Current with  PCP.   PT consult is pending. Patient and wife decline desire for home health services. Son spoke with CM privately and says that if physical therapy recommends home health and patient will not have a copay, they will accept home health services.  Asked for CM to make a referral to agency [facility] and to see what copay would be.  [facility]

## 2016-12-07 NOTE — Care Management Important Message (Signed)
Important Message  Patient Details  Name: Bobby CroonHarvey L Perazzo MRN: [id number] Date of Birth: 1932/11/12   Medicare Important Message Given:  Yes Initial signed IM printed from Epic and given to patient.   Eber HongGreene, Shawonda Kerce R, RN 12/07/2016, 8:34 AM

## 2016-12-07 NOTE — Evaluation (Signed)
Physical Therapy Evaluation Patient Details Name: Bobby [name] MRN: [id number] DOB: 1932/06/04 Today's Date: 12/07/2016   History of Present Illness  Pt pleasant 83 y/o male admitted due to multiple falls and hyponetremia. Has a history of CAD with 2 stents, GERDs, HTN, and HLD.  Clinical Impression  Pt stated he was feeling much better and willing to participate in PT eval. Pt is independent at baseline w/o any AD's. Overall strength and ROM appear WFL at least 4/5. Pt able to perform bed mobility to sitting at EOB with PT supervision and use of bilat UEs . Pt able to stand with RW and min guarding from PT and utilized his bilat UE to assist with lift off.   BP was 142/62 seated and was 138/72 in standing with no symptoms of orthostatic hypotension. Pt able to ambulate with min guarding from PT w/o AD. He ambulated with a slow gait speed and forward flexed trunk and was slightly unstable. Pt then used RW and displayed increased stability; he stated it felt easier to walk with RW . Education was provided on the importance of currently using RW. Pt displays balance and ambulatory deficits that limit safe functional mobility. He will benefit from skilled PT to improve deficits listed above.     Follow Up Recommendations Home health PT    Equipment Recommendations   (pt stated he has a RW)    Recommendations for Other Services       Precautions / Restrictions Precautions Precautions: Fall Restrictions Weight Bearing Restrictions: No      Mobility  Bed Mobility Overal bed mobility: Needs Assistance Bed Mobility: Supine to Sit     Supine to sit: Supervision     General bed mobility comments: uses UE to obtain sitting position  Transfers Overall transfer level: Needs assistance Equipment used: Rolling walker (2 wheeled) Transfers: Sit to/from Stand Sit to Stand: Min guard         General transfer comment: Uses Bilat UE to assist with  transfers  Ambulation/Gait Ambulation/Gait assistance: Min guard;+2 safety/equipment Ambulation Distance (Feet): 80 Feet Assistive device: None Gait Pattern/deviations: Step-through pattern;Decreased stride length;Trunk flexed   Gait velocity interpretation: Below normal speed for age/gender General Gait Details: decreased gait speed, slightly unstable and forward flexed posture   Stairs            Wheelchair Mobility    Modified Rankin (Stroke Patients Only)       Balance Overall balance assessment: Needs assistance Sitting-balance support: No upper extremity supported Sitting balance-Leahy Scale: Good Sitting balance - Comments: Pt able to maintain upright posture and perform dressing activites   Standing balance support: Bilateral upper extremity supported Standing balance-Leahy Scale: Fair Standing balance comment: forward flexed trunk in stance                             Pertinent Vitals/Pain Pain Assessment: No/denies pain    Home Living Family/patient expects to be discharged to:: Private residence Living Arrangements: Spouse/significant other Available Help at Discharge: Family Type of Home: House Home Access: Stairs to enter Entrance Stairs-Rails: Doctor, general practiceight;Left Entrance Stairs-Number of Steps: 4 Home Layout: One level Home Equipment: Environmental consultantWalker - 2 wheels      Prior Function Level of Independence: Independent               Hand Dominance        Extremity/Trunk Assessment   Upper Extremity Assessment Upper Extremity Assessment: Overall WFL for tasks  assessed (grossly at least 4/5)    Lower Extremity Assessment Lower Extremity Assessment: Overall WFL for tasks assessed (grossly at least 4/5)       Communication   Communication: No difficulties  Cognition Arousal/Alertness: Awake/alert Behavior During Therapy: WFL for tasks assessed/performed Overall Cognitive Status: Within Functional Limits for tasks assessed                       General Comments      Exercises Other Exercises Other Exercises: gait training- w/ RW and min guarding from PT - 120 ft   Assessment/Plan    PT Assessment Patient needs continued PT services  PT Problem List Decreased activity tolerance;Decreased balance;Decreased knowledge of use of DME;Decreased mobility;Decreased safety awareness;Cardiopulmonary status limiting activity;Decreased range of motion;Decreased strength          PT Treatment Interventions DME instruction;Gait training;Stair training;Functional mobility training;Balance training;Therapeutic exercise;Therapeutic activities;Patient/family education    PT Goals (Current goals can be found in the Care Plan section)  Acute Rehab PT Goals Patient Stated Goal: Return home PT Goal Formulation: With patient Time For Goal Achievement: 12/21/16 Potential to Achieve Goals: Good    Frequency Min 2X/week   Barriers to discharge Inaccessible home environment      Co-evaluation               End of Session Equipment Utilized During Treatment: Gait belt Activity Tolerance: Patient tolerated treatment well Patient left: in chair;with call bell/phone within reach;with family/visitor present;with chair alarm set           Time: 8119-1478 PT Time Calculation (min) (ACUTE ONLY): 25 min   Charges:         PT G Codes:        [employer]  Student PT 12/07/2016, 11:05 AM

## 2016-12-08 DIAGNOSIS — E871 Hypo-osmolality and hyponatremia: Secondary | ICD-10-CM | POA: Diagnosis not present

## 2016-12-08 LAB — BASIC METABOLIC PANEL
Anion gap: 5 (ref 5–15)
BUN: 20 mg/dL (ref 6–20)
CHLORIDE: 103 mmol/L (ref 101–111)
CO2: 20 mmol/L — AB (ref 22–32)
CREATININE: 1.03 mg/dL (ref 0.61–1.24)
Calcium: 8.3 mg/dL — ABNORMAL LOW (ref 8.9–10.3)
GFR calc non Af Amer: >60 mL/min (ref >60)
Glucose, Bld: 88 mg/dL (ref 65–99)
Potassium: 4.3 mmol/L (ref 3.5–5.1)
Sodium: 128 mmol/L — ABNORMAL LOW (ref 135–145)

## 2016-12-08 LAB — SODIUM, URINE, RANDOM: SODIUM UR: 99 mmol/L

## 2016-12-08 LAB — OSMOLALITY: OSMOLALITY: 268 mosm/kg — AB (ref 275–295)

## 2016-12-08 LAB — SODIUM: Sodium: 127 mmol/L — ABNORMAL LOW (ref 135–145)

## 2016-12-08 MED ORDER — SODIUM CHLORIDE 1 G PO TABS
1.0000 g | ORAL_TABLET | Freq: Once | ORAL | Status: AC
  Administered 2016-12-08: 1 g via ORAL
  Filled 2016-12-08: qty 1

## 2016-12-08 NOTE — Progress Notes (Signed)
[employer] -  at [facility]   PATIENT NAME: Bobby [name]    MR#:  [id number]  DATE OF BIRTH:  Dec 24, 1931  SUBJECTIVE:  CHIEF COMPLAINT:   Chief Complaint  Patient presents with  . Weakness   - feels better and stronger, asking about discharge - sodium still low at 127  REVIEW OF SYSTEMS:  Review of Systems  Constitutional: Negative for chills, fever and malaise/fatigue.  HENT: Negative for ear discharge, ear pain, hearing loss and nosebleeds.   Eyes: Negative for blurred vision, double vision and photophobia.  Respiratory: Negative for cough, hemoptysis, shortness of breath and wheezing.   Cardiovascular: Negative for chest pain, palpitations, orthopnea and leg swelling.  Gastrointestinal: Negative for abdominal pain, constipation, diarrhea, melena, nausea and vomiting.  Genitourinary: Negative for dysuria.  Musculoskeletal: Negative for back pain, myalgias and neck pain.  Skin: Negative for rash.  Neurological: Negative for dizziness, tingling, tremors, sensory change, speech change, focal weakness and headaches.  Endo/Heme/Allergies: Does not bruise/bleed easily.  Psychiatric/Behavioral: Negative for depression.    DRUG ALLERGIES:   Allergies  Allergen Reactions  . Percocet [Oxycodone-Acetaminophen]     Hallucinations   . Septra [Sulfamethoxazole-Trimethoprim]   . Tramadol Hcl Other (See Comments)    Hallucinations    VITALS:  Blood pressure (!) 146/79, pulse (!) 50, temperature 98.2 F (36.8 C), temperature source Oral, resp. rate 18, height 5\' 8"  (1.727 m), weight 68.9 kg (151 lb 14.4 oz), SpO2 100 %.  PHYSICAL EXAMINATION:  Physical Exam  GENERAL:  83 y.o.-year-old patient lying in the bed with no acute distress.  EYES: Pupils equal, round, reactive to light and accommodation. No scleral icterus. Extraocular muscles intact.  HEENT: Head atraumatic, normocephalic. Oropharynx and nasopharynx clear.  NECK:  Supple, no jugular venous  distention. No thyroid enlargement, no tenderness.  LUNGS: Normal breath sounds bilaterally, no wheezing, rales,rhonchi or crepitation. No use of accessory muscles of respiration.  CARDIOVASCULAR: S1, S2 normal. No rubs, or gallops. 2/6 systolic murmur present. ABDOMEN: Soft, nontender, nondistended. Bowel sounds present. No organomegaly or mass.  EXTREMITIES: No pedal edema, cyanosis, or clubbing.  NEUROLOGIC: Cranial nerves II through XII are intact. Muscle strength 5/5 in all extremities. Sensation intact. Gait not checked.  PSYCHIATRIC: The patient is alert and oriented x 3.  SKIN: No obvious rash, lesion, or ulcer.    LABORATORY PANEL:   CBC  Recent Labs Lab 12/07/16 0325  WBC 3.2*  HGB 10.3*  HCT 28.7*  PLT 161   ------------------------------------------------------------------------------------------------------------------  Chemistries   Recent Labs Lab 12/08/16 0601 12/08/16 1247  NA 128* 127*  K 4.3  --   CL 103  --   CO2 20*  --   GLUCOSE 88  --   BUN 20  --   CREATININE 1.03  --   CALCIUM 8.3*  --    ------------------------------------------------------------------------------------------------------------------  Cardiac Enzymes  Recent Labs Lab 12/06/16 1140  TROPONINI <0.03   ------------------------------------------------------------------------------------------------------------------  RADIOLOGY:  Ct Head Wo Contrast  Result Date: 12/06/2016 CLINICAL DATA:  Weakness and syncope. EXAM: CT HEAD WITHOUT CONTRAST TECHNIQUE: Contiguous axial images were obtained from the base of the skull through the vertex without intravenous contrast. COMPARISON:  01/21/2014 MRI, 01/20/2014 CT FINDINGS: Brain: Chronic focus of right cerebellar encephalomalacia from chronic infarct. Chronic moderate degree of periventricular white matter areas of hypoattenuation consistent with small vessel ischemia. Chronic bilateral basal ganglial lacunar infarcts. No intra-axial  mass, mass effect, shift of midline structures nor acute hemorrhage. Stable mild sulcal and  mild to moderate ventricular prominence. No effacement of the basal cisterns. Vascular: No hyperdense vessel. Moderate atherosclerosis of the carotid siphons and left vertebral artery. Skull: Nonacute Sinuses/Orbits: Clear mastoids. Mild ethmoid sinus mucosal thickening. Frontal sinus is developmentally underpneumatized. Other: None IMPRESSION: Chronic small vessel ischemic disease of periventricular white matter. No acute intracranial abnormality. Chronic inferior right cerebellar infarct. Electronically Signed   By: Tollie Ethavid  Kwon M.D.   On: 12/06/2016 14:26    EKG:   Orders placed or performed during the hospital encounter of 12/06/16  . ED EKG  . ED EKG  . EKG 12-Lead  . EKG 12-Lead    ASSESSMENT AND PLAN:   83y/o M with PMH of CAD s/p stents, GERD, carotid stenosis, PVD, remote CVA, arthirits presents secondary to weakness and falls  #1 Hyponatremia- hypovolemic initially, not improving much even after fluids - nephrology consulted- ? SIADH - salt tabs, d/c fluids as drinking well - discontinued HCTZ on admission - monitor until >130 - physical therapy consult for falls - CT head with no acute findings  #2 HTN- on norvasc, metoprolol. - HCTZ discontinued  #3 CAD s/p stents-stable, on asa, statin and metoprolol  #4 GERD-on PPI  #5 DVT prophylaxis- lovenox  Physical Therapy consulted. Recommended home health    All the records are reviewed and case discussed with Care Management/Social Workerr. Management plans discussed with the patient, family and they are in agreement.  CODE STATUS: Full Code  TOTAL TIME TAKING CARE OF THIS PATIENT: 33 minutes.   POSSIBLE D/C TOMORROW, DEPENDING ON CLINICAL CONDITION.   Enid BaasKALISETTI,Shawnn Bouillon M.D on 12/08/2016 at 2:03 PM  Between 7am to 6pm - Pager - [phone]  After 6pm go to [email or web address] - Social research officer, governmentpassword [email or web address]  [facility] [place] [facility]    Office  [phone]  CC: Primary care physician; Danella PentonMark F Miller, MD

## 2016-12-09 DIAGNOSIS — E871 Hypo-osmolality and hyponatremia: Secondary | ICD-10-CM | POA: Diagnosis not present

## 2016-12-09 LAB — BASIC METABOLIC PANEL
ANION GAP: 6 (ref 5–15)
BUN: 22 mg/dL — AB (ref 6–20)
CO2: 22 mmol/L (ref 22–32)
Calcium: 8.3 mg/dL — ABNORMAL LOW (ref 8.9–10.3)
Chloride: 100 mmol/L — ABNORMAL LOW (ref 101–111)
Creatinine, Ser: 1.11 mg/dL (ref 0.61–1.24)
GFR calc Af Amer: >60 mL/min (ref >60)
GFR calc non Af Amer: 60 mL/min — ABNORMAL LOW (ref >60)
GLUCOSE: 90 mg/dL (ref 65–99)
POTASSIUM: 4.2 mmol/L (ref 3.5–5.1)
Sodium: 128 mmol/L — ABNORMAL LOW (ref 135–145)

## 2016-12-09 MED ORDER — AMLODIPINE BESYLATE 5 MG PO TABS: 5.0000 mg | ORAL_TABLET | Freq: Every day | ORAL | 2 refills | Status: DC

## 2016-12-09 NOTE — Progress Notes (Signed)
Remains weak,PT in progress,sodium trending on the low side,remains sinus rhythm/sinus bradycardia.

## 2016-12-09 NOTE — Progress Notes (Signed)
Patient is being discharge in a stable condition, summary and f/u care given, verbalized understanding, left with wife and son

## 2016-12-09 NOTE — Care Management (Signed)
[facility] will be able to accept patient for physical therapy and aide under medicare [employer].  would not be able to accept for nursing.  There are no nursing needs.

## 2016-12-09 NOTE — Consult Note (Signed)
[facility]  CONSULT NOTE    Date: 12/09/2016                  Patient Name:  Bobby [name]  MRN: [id number]  DOB: 12-02-1931  Age / Sex: 83 y.o., male         PCP: Danella Penton, MD                 Service Requesting Consult: Dr. Nemiah Commander                 Reason for Consult: Hyponatremia            History of Present Illness: Bobby [name] is a 83 y.o. white male with peripheral vascular disease, coronary artery disease status post CABG, hypertension, hyperlipidemia, GERD, who was admitted to [facility] on 12/06/2016 for Hyponatremia [E87.1] Weakness [R53.1] Syncope, unspecified syncope type [R55]   Patient was found to be hyponatremia on hydrochlorothiazide. This was stopped. Sodium went from 124 to 128.   Patient is asymptomatic   Medications: Outpatient medications: Prescriptions Prior to Admission  Medication Sig Dispense Refill Last Dose  . aspirin 81 MG tablet Take 325 mg by mouth daily. For pain   12/05/2016 at 0800  . Bee Pollen 1000 MG TABS Take 2,000 mg by mouth 2 (two) times daily.   12/05/2016 at 2000  . Cholecalciferol (VITAMIN D3) 1000 units CAPS Take 1,000 Units by mouth daily.   12/05/2016 at 0800  . ferrous sulfate 325 (65 FE) MG tablet Take 325 mg by mouth every Monday, Wednesday, and Friday.   Past Week at Unknown time  . hydrochlorothiazide (HYDRODIURIL) 12.5 MG tablet Take 12.5 mg by mouth daily.   12/06/2016 at 0800  . metoprolol tartrate (LOPRESSOR) 25 MG tablet Take 0.5 tablets (12.5 mg total) by mouth 2 (two) times daily. 30 tablet 1 12/05/2016 at 2000  . Multiple Vitamins-Minerals (OCUVITE PRESERVISION PO) Take 1 tablet by mouth 2 (two) times daily.   12/05/2016 at 2000  . omeprazole (PRILOSEC) 20 MG capsule Take 20 mg by mouth daily.    12/05/2016 at 1400  . senna-docusate (SENOKOT-S) 8.6-50 MG per tablet Take 1 tablet by mouth daily as needed for constipation.   PRN at PRN  . simvastatin (ZOCOR) 20 MG tablet Take 10 mg by mouth at  bedtime.   12/05/2016 at 2000  . vitamin C (ASCORBIC ACID) 500 MG tablet Take 500 mg by mouth daily.   12/05/2016 at 2000    Current medications: Current Facility-Administered Medications  Medication Dose Route Frequency Provider Last Rate Last Dose  . acetaminophen (TYLENOL) tablet 650 mg  650 mg Oral Q6H PRN Bobby Bilberry, MD       Or  . acetaminophen (TYLENOL) suppository 650 mg  650 mg Rectal Q6H PRN Bobby Bilberry, MD      . amLODipine (NORVASC) tablet 5 mg  5 mg Oral Daily Bobby Bilberry, MD   5 mg at 12/09/16 0912  . aspirin EC tablet 325 mg  325 mg Oral Daily Bobby Bilberry, MD   325 mg at 12/09/16 0911  . enoxaparin (LOVENOX) injection 40 mg  40 mg Subcutaneous Q24H Bobby Bilberry, MD   40 mg at 12/08/16 2049  . ibuprofen (ADVIL,MOTRIN) tablet 200 mg  200 mg Oral Q6H PRN Bobby Bilberry, MD      . metoprolol tartrate (LOPRESSOR) tablet 12.5 mg  12.5 mg Oral BID Bobby Bilberry, MD   12.5 mg at 12/09/16 0912  .  ondansetron (ZOFRAN) tablet 4 mg  4 mg Oral Q6H PRN Bobby Bilberry, MD       Or  . ondansetron (ZOFRAN) injection 4 mg  4 mg Intravenous Q6H PRN Bobby Bilberry, MD      . pantoprazole (PROTONIX) EC tablet 40 mg  40 mg Oral Daily Bobby Bilberry, MD   40 mg at 12/09/16 0911  . simvastatin (ZOCOR) tablet 10 mg  10 mg Oral QHS Bobby Bilberry, MD   10 mg at 12/08/16 2050  . vitamin C (ASCORBIC ACID) tablet 500 mg  500 mg Oral Daily Bobby Bilberry, MD   500 mg at 12/09/16 8119      Allergies: Allergies  Allergen Reactions  . Percocet [Oxycodone-Acetaminophen]     Hallucinations   . Septra [Sulfamethoxazole-Trimethoprim]   . Tramadol Hcl Other (See Comments)    Hallucinations      Past Medical History: Past Medical History:  Diagnosis Date  . Arthritis    (R)TKR 5/13 [facility]  . Coronary artery disease    2 stents  . GERD (gastroesophageal reflux disease)   . Myocardial infarction    NSTEMI 11/11/12 @ [facility]  . Peripheral vascular disease [facility])    (L)CEA 2007 [place]   . PONV (postoperative nausea and vomiting)      Past Surgical History: Past Surgical History:  Procedure Laterality Date  . CARDIAC CATHETERIZATION     11/11/12 [facility],2000,2006 w/ stents  . CATARACT EXTRACTION    . CORONARY ARTERY BYPASS GRAFT  11/16/2012   Procedure: CORONARY ARTERY BYPASS GRAFTING (CABG);  Surgeon: Kerin Perna, MD;  Location: [facility] OR;  Service: Open Heart Surgery;  Laterality: N/A;  times four using Bilateral Greater Saphenous Vein Graft harvested endoscopically from bilateral thighs, and Left Internal Mammary Artery.  Marland Kitchen HERNIA REPAIR  01/17/13   Dr Renda Rolls @ [facility]  . INTRAOPERATIVE TRANSESOPHAGEAL ECHOCARDIOGRAM  11/16/2012   Procedure: INTRAOPERATIVE TRANSESOPHAGEAL ECHOCARDIOGRAM;  Surgeon: Kerin Perna, MD;  Location: [facility];  Service: Open Heart Surgery;  Laterality: N/A;  . JOINT REPLACEMENT     (R)TKR 5/13 [facility]  . VASCULAR SURGERY     (R)TKR 5/13 [facility]     Family History: History reviewed. No pertinent family history.   Social History: Social History   Social History  . Marital status: Married    Spouse name: N/A  . Number of children: N/A  . Years of education: N/A   Occupational History  . Not on file.   Social History Main Topics  . Smoking status: Former Smoker    Types: Cigars    Quit date: 10/22/1989  . Smokeless tobacco: Never Used  . Alcohol use No  . Drug use: No  . Sexual activity: Not on file   Other Topics Concern  . Not on file   Social History Narrative  . No narrative on file     Review of Systems: Review of Systems  Constitutional: Negative for chills, diaphoresis, fever, malaise/fatigue and weight loss.  HENT: Negative.  Negative for congestion, ear discharge, ear pain, hearing loss, nosebleeds, sinus pain, sore throat and tinnitus.   Eyes: Negative.  Negative for blurred vision, double vision, photophobia, pain, discharge and redness.  Respiratory: Negative.  Negative for cough, hemoptysis, sputum  production, shortness of breath, wheezing and stridor.   Cardiovascular: Negative.  Negative for chest pain, palpitations, orthopnea, claudication, leg swelling and PND.  Gastrointestinal: Negative.  Negative for abdominal pain, blood in stool, constipation, diarrhea, heartburn, melena, nausea and vomiting.  Genitourinary: Negative.  Negative for dysuria, flank pain, frequency, hematuria and urgency.  Musculoskeletal: Negative.  Negative for back pain, falls, joint pain, myalgias and neck pain.  Skin: Negative.  Negative for itching and rash.  Neurological: Positive for weakness. Negative for dizziness, tingling, tremors, sensory change, speech change, focal weakness, seizures, loss of consciousness and headaches.  Endo/Heme/Allergies: Negative for environmental allergies and polydipsia. Does not bruise/bleed easily.  Psychiatric/Behavioral: Negative.  Negative for depression, hallucinations, memory loss, substance abuse and suicidal ideas. The patient is not nervous/anxious and does not have insomnia.     Vital Signs: Blood pressure 109/61, pulse 64, temperature 99.3 F (37.4 C), temperature source Oral, resp. rate 18, height 5\' 8"  (1.727 m), weight 68.9 kg (151 lb 14.4 oz), SpO2 100 %.  Weight trends: Filed Weights   12/06/16 1138 12/06/16 1615  Weight: 81.6 kg (180 lb) 68.9 kg (151 lb 14.4 oz)    Physical Exam: General: NAD, laying in bed  Head: Normocephalic, atraumatic. Moist oral mucosal membranes  Eyes: Anicteric, PERRL  Neck: Supple, trachea midline  Lungs:  Clear to auscultation  Heart: Regular rate and rhythm  Abdomen:  Soft, nontender,   Extremities: No peripheral edema.  Neurologic: Nonfocal, moving all four extremities  Skin: No lesions        Lab results: Basic Metabolic Panel:  Recent Labs Lab 12/07/16 0325 12/08/16 0601 12/08/16 1247 12/09/16 0401  NA 128* 128* 127* 128*  K 4.3 4.3  --  4.2  CL 99* 103  --  100*  CO2 21* 20*  --  22  GLUCOSE 112* 88   --  90  BUN 21* 20  --  22*  CREATININE 1.12 1.03  --  1.11  CALCIUM 8.2* 8.3*  --  8.3*    Liver Function Tests: No results for input(s): AST, ALT, ALKPHOS, BILITOT, PROT, ALBUMIN in the last 168 hours. No results for input(s): LIPASE, AMYLASE in the last 168 hours. No results for input(s): AMMONIA in the last 168 hours.  CBC:  Recent Labs Lab 12/06/16 1140 12/07/16 0325  WBC 3.3* 3.2*  HGB 11.6* 10.3*  HCT 32.5* 28.7*  MCV 94.0 93.4  PLT 182 161    Cardiac Enzymes:  Recent Labs Lab 12/06/16 1140  TROPONINI <0.03    BNP: Invalid input(s): POCBNP  CBG: No results for input(s): GLUCAP in the last 168 hours.  Microbiology: Results for orders placed or performed during the hospital encounter of 11/14/12  Surgical pcr screen     Status: Abnormal   Collection Time: 11/14/12  5:28 PM  Result Value Ref Range Status   MRSA, PCR NEGATIVE NEGATIVE Final   Staphylococcus aureus POSITIVE (A) NEGATIVE Final    Comment:        The Xpert SA Assay (FDA approved for NASAL specimens in patients over 81 years of age), is one component of a comprehensive surveillance program.  Test performance has been validated by [facility] for patients greater than or equal to 81 year old. It is not intended to diagnose infection nor to guide or monitor treatment. RESULT CALLED TO, READ BACK BY AND VERIFIED WITH: RN S. TERVITTE 11/14/12 2110 KERANM    Coagulation Studies: No results for input(s): LABPROT, INR in the last 72 hours.  Urinalysis:  Recent Labs  12/07/16 0030  COLORURINE YELLOW*  LABSPEC 1.008  PHURINE 6.0  GLUCOSEU NEGATIVE  HGBUR NEGATIVE  BILIRUBINUR NEGATIVE  KETONESUR NEGATIVE  PROTEINUR NEGATIVE  NITRITE NEGATIVE  LEUKOCYTESUR NEGATIVE  Imaging:  No results found.   Assessment & Plan: Mr. DEZMIN KITTELSON is a 83 y.o. white male with peripheral vascular disease, coronary artery disease status post CABG, hypertension, hyperlipidemia, GERD,  who was admitted to [facility] on 12/06/2016 for Hyponatremia [E87.1] Weakness [R53.1] Syncope, unspecified syncope type [R55]   1. Hyponatremia: seems to be a chronic issues. Labs consistent with renal losses. Most likely culprit is hydrochlorothiaze. Sodium has not improved drastically due to reset osmostat.  - Will need outpatient follow up .   2. Hypertension: blood pressure at goal - metoprolol and amlodipine.   3. Anemia: hemoglobin 10.3  LOS: 3 Bobby [name] 1/18/201811:18 AM

## 2016-12-09 NOTE — Discharge Summary (Signed)
[facility] - [place] [facility]   PATIENT NAME: Bobby [name]    MR#:  [id number]  DATE OF BIRTH:  10-18-1932  DATE OF ADMISSION:  12/06/2016   ADMITTING PHYSICIAN: Auburn BilberryShreyang Patel, MD  DATE OF DISCHARGE: 12/09/16  PRIMARY CARE PHYSICIAN: Danella PentonMark F Miller, MD   ADMISSION DIAGNOSIS:   Hyponatremia [E87.1] Weakness [R53.1] Syncope, unspecified syncope type [R55]  DISCHARGE DIAGNOSIS:   Active Problems:   Hyponatremia   SECONDARY DIAGNOSIS:   Past Medical History:  Diagnosis Date  . Arthritis    (R)TKR 5/13 [facility]  . Coronary artery disease    2 stents  . GERD (gastroesophageal reflux disease)   . Myocardial infarction    NSTEMI 11/11/12 @ [facility]  . Peripheral vascular disease [facility](HCC)    (L)CEA 2007 [place]  . PONV (postoperative nausea and vomiting)     HOSPITAL COURSE:   83y/o M with PMH of CAD s/p stents, GERD, carotid stenosis, PVD, remote CVA, arthirits presents secondary to weakness and falls  #1 Hyponatremia- hypovolemic initially, labs consistent with renal losses - appreciate nephrology consult - discontinued HCTZ on admission - physical therapy consult for falls - CT head with no acute findings - reset osmostat- f/u with nephrology as outpatient  #2 HTN- on norvasc, metoprolol. - HCTZ discontinued  #3 CAD s/p stents-stable, on asa, statin and metoprolol  #4 GERD-on PPI  Physical Therapy consulted. Recommended home health Discharge today   DISCHARGE CONDITIONS:   Guarded  CONSULTS OBTAINED:   Treatment Team:  Lamont DowdySarath Kolluru, MD  DRUG ALLERGIES:   Allergies  Allergen Reactions  . Percocet [Oxycodone-Acetaminophen]     Hallucinations   . Septra [Sulfamethoxazole-Trimethoprim]   . Tramadol Hcl Other (See Comments)    Hallucinations   DISCHARGE MEDICATIONS:   Allergies as of 12/09/2016      Reactions   Percocet [oxycodone-acetaminophen]    Hallucinations   Septra [sulfamethoxazole-trimethoprim]    Tramadol Hcl Other (See Comments)   Hallucinations      Medication List    STOP taking these medications   hydrochlorothiazide 12.5 MG tablet Commonly known as:  HYDRODIURIL     TAKE these medications   amLODipine 5 MG tablet Commonly known as:  NORVASC Take 1 tablet (5 mg total) by mouth daily. Start taking on:  12/10/2016   aspirin 81 MG tablet Take 325 mg by mouth daily. For pain   Bee Pollen 1000 MG Tabs Take 2,000 mg by mouth 2 (two) times daily.   ferrous sulfate 325 (65 FE) MG tablet Take 325 mg by mouth every Monday, Wednesday, and Friday.   metoprolol tartrate 25 MG tablet Commonly known as:  LOPRESSOR Take 0.5 tablets (12.5 mg total) by mouth 2 (two) times daily.   OCUVITE PRESERVISION PO Take 1 tablet by mouth 2 (two) times daily.   omeprazole 20 MG capsule Commonly known as:  PRILOSEC Take 20 mg by mouth daily.   senna-docusate 8.6-50 MG tablet Commonly known as:  Senokot-S Take 1 tablet by mouth daily as needed for constipation.   simvastatin 20 MG tablet Commonly known as:  ZOCOR Take 10 mg by mouth at bedtime.   vitamin C 500 MG tablet Commonly known as:  ASCORBIC ACID Take 500 mg by mouth daily.   Vitamin D3 1000 units Caps Take 1,000 Units by mouth daily.        DISCHARGE INSTRUCTIONS:   1. PCP f/u in 2 weeks 2. Nephrology f/u in 1-2 weeks  DIET:   Cardiac  diet  ACTIVITY:   Activity as tolerated  OXYGEN:   Home Oxygen: No.  Oxygen Delivery: room air  DISCHARGE LOCATION:   home   If you experience worsening of your admission symptoms, develop shortness of breath, life threatening emergency, suicidal or homicidal thoughts you must seek medical attention immediately by calling 911 or calling your MD immediately  if symptoms less severe.  You Must read complete instructions/literature along with all the possible adverse reactions/side effects for all the Medicines you take and that have been prescribed to you. Take any new  Medicines after you have completely understood and accpet all the possible adverse reactions/side effects.   Please note  You were cared for by a hospitalist during your hospital stay. If you have any questions about your discharge medications or the care you received while you were in the hospital after you are discharged, you can call the unit and asked to speak with the hospitalist on call if the hospitalist that took care of you is not available. Once you are discharged, your primary care physician will handle any further medical issues. Please note that NO REFILLS for any discharge medications will be authorized once you are discharged, as it is imperative that you return to your primary care physician (or establish a relationship with a primary care physician if you do not have one) for your aftercare needs so that they can reassess your need for medications and monitor your lab values.    On the day of Discharge:  VITAL SIGNS:   Blood pressure (!) 121/57, pulse (!) 54, temperature 98.5 F (36.9 C), temperature source Oral, resp. rate 17, height 5\' 8"  (1.727 m), weight 68.9 kg (151 lb 14.4 oz), SpO2 100 %.  PHYSICAL EXAMINATION:    GENERAL:  83 y.o.-year-old patient lying in the bed with no acute distress.  EYES: Pupils equal, round, reactive to light and accommodation. No scleral icterus. Extraocular muscles intact.  HEENT: Head atraumatic, normocephalic. Oropharynx and nasopharynx clear.  NECK:  Supple, no jugular venous distention. No thyroid enlargement, no tenderness.  LUNGS: Normal breath sounds bilaterally, no wheezing, rales,rhonchi or crepitation. No use of accessory muscles of respiration.  CARDIOVASCULAR: S1, S2 normal. No rubs, or gallops. 2/6 systolic murmur present. ABDOMEN: Soft, nontender, nondistended. Bowel sounds present. No organomegaly or mass.  EXTREMITIES: No pedal edema, cyanosis, or clubbing.  NEUROLOGIC: Cranial nerves II through XII are intact. Muscle  strength 5/5 in all extremities. Sensation intact. Gait not checked.  PSYCHIATRIC: The patient is alert and oriented x 3.  SKIN: No obvious rash, lesion, or ulcer.   DATA REVIEW:   CBC  Recent Labs Lab 12/07/16 0325  WBC 3.2*  HGB 10.3*  HCT 28.7*  PLT 161    Chemistries   Recent Labs Lab 12/09/16 0401  NA 128*  K 4.2  CL 100*  CO2 22  GLUCOSE 90  BUN 22*  CREATININE 1.11  CALCIUM 8.3*     Microbiology Results  Results for orders placed or performed during the hospital encounter of 11/14/12  Surgical pcr screen     Status: Abnormal   Collection Time: 11/14/12  5:28 PM  Result Value Ref Range Status   MRSA, PCR NEGATIVE NEGATIVE Final   Staphylococcus aureus POSITIVE (A) NEGATIVE Final    Comment:        The Xpert SA Assay (FDA approved for NASAL specimens in patients over 59 years of age), is one component of a comprehensive surveillance program.  Test performance has  been validated by [facility] for patients greater than or equal to 7 year old. It is not intended to diagnose infection nor to guide or monitor treatment. RESULT CALLED TO, READ BACK BY AND VERIFIED WITH: RN S. TERVITTE 11/14/12 2110 KERANM    RADIOLOGY:  No results found.   Management plans discussed with the patient, family and they are in agreement.  CODE STATUS:     Code Status Orders        Start     Ordered   12/06/16 1622  Full code  Continuous     12/06/16 1621    Code Status History    Date Active Date Inactive Code Status Order ID Comments User Context   11/14/2012  5:37 PM 11/16/2012  1:56 PM Full Code [id number]  Ardelle Balls, PA Inpatient      TOTAL TIME TAKING CARE OF THIS PATIENT: 37 minutes.    Enid Baas M.D on 12/09/2016 at 12:12 PM  Between 7am to 6pm - Pager - [phone]  After 6pm go to [email or web address] - Social research officer, government  [facility]  Office  [phone]  CC: Primary care physician; Danella Penton,  MD   Note: This dictation was prepared with Dragon dictation along with smaller phrase technology. Any transcriptional errors that result from this process are unintentional.

## 2016-12-09 NOTE — Care Management (Signed)
Patient says he can not afford copays for home health if there are any.  [facility] and [facility] can not accept for home health.  [facility] [place]

## 2016-12-31 NOTE — Progress Notes (Signed)
   12/07/16 1049  PT Time Calculation  PT Start Time (ACUTE ONLY) 0948  PT Stop Time (ACUTE ONLY) 1013  PT Time Calculation (min) (ACUTE ONLY) 25 min  PT G-Codes **NOT FOR INPATIENT CLASS**  Functional Assessment Tool Used clinical judgment  Functional Limitation Mobility: Walking and moving around  Mobility: Walking and Moving Around Current Status (W0981(G8978) CJ  Mobility: Walking and Moving Around Goal Status (X9147(G8979) CI  PT General Charges  $$ ACUTE PT VISIT 1 Procedure  PT Evaluation  $PT Eval Low Complexity 1 Procedure  PT Treatments  $Gait Training 8-22 mins    Late-entry g-codes entered after review of initial evaluation/documentation.  Alyda Megna H. Manson PasseyBrown, PT, DPT, NCS 12/31/16, 11:21 AM [phone]

## 2017-01-21 ENCOUNTER — Other Ambulatory Visit: Payer: Self-pay | Admitting: Internal Medicine

## 2017-01-21 DIAGNOSIS — M5116 Intervertebral disc disorders with radiculopathy, lumbar region: Secondary | ICD-10-CM

## 2017-02-03 ENCOUNTER — Ambulatory Visit
Admission: RE | Admit: 2017-02-03 | Discharge: 2017-02-03 | Disposition: A | Discharge status: Home / Self Care | Payer: Medicare HMO | Source: Ambulatory Visit | Attending: Internal Medicine | Admitting: Internal Medicine

## 2017-02-03 DIAGNOSIS — M48061 Spinal stenosis, lumbar region without neurogenic claudication: Secondary | ICD-10-CM | POA: Diagnosis not present

## 2017-02-03 DIAGNOSIS — M5116 Intervertebral disc disorders with radiculopathy, lumbar region: Secondary | ICD-10-CM | POA: Insufficient documentation

## 2017-03-04 ENCOUNTER — Emergency Department: Payer: Medicare HMO

## 2017-03-04 ENCOUNTER — Telehealth: Payer: Self-pay

## 2017-03-04 ENCOUNTER — Inpatient Hospital Stay
Admission: EM | Admit: 2017-03-04 | Discharge: 2017-03-11 | DRG: 643 | Disposition: A | Discharge status: Skilled Nursing Facility | Payer: Medicare HMO | Attending: Internal Medicine | Admitting: Internal Medicine

## 2017-03-04 DIAGNOSIS — Z951 Presence of aortocoronary bypass graft: Secondary | ICD-10-CM | POA: Diagnosis not present

## 2017-03-04 DIAGNOSIS — R270 Ataxia, unspecified: Secondary | ICD-10-CM | POA: Diagnosis present

## 2017-03-04 DIAGNOSIS — K269 Duodenal ulcer, unspecified as acute or chronic, without hemorrhage or perforation: Secondary | ICD-10-CM | POA: Diagnosis not present

## 2017-03-04 DIAGNOSIS — M48061 Spinal stenosis, lumbar region without neurogenic claudication: Secondary | ICD-10-CM | POA: Diagnosis present

## 2017-03-04 DIAGNOSIS — R52 Pain, unspecified: Secondary | ICD-10-CM

## 2017-03-04 DIAGNOSIS — R4182 Altered mental status, unspecified: Secondary | ICD-10-CM

## 2017-03-04 DIAGNOSIS — I10 Essential (primary) hypertension: Secondary | ICD-10-CM | POA: Diagnosis present

## 2017-03-04 DIAGNOSIS — E785 Hyperlipidemia, unspecified: Secondary | ICD-10-CM | POA: Diagnosis present

## 2017-03-04 DIAGNOSIS — R011 Cardiac murmur, unspecified: Secondary | ICD-10-CM | POA: Diagnosis not present

## 2017-03-04 DIAGNOSIS — R3915 Urgency of urination: Secondary | ICD-10-CM

## 2017-03-04 DIAGNOSIS — Z66 Do not resuscitate: Secondary | ICD-10-CM | POA: Diagnosis present

## 2017-03-04 DIAGNOSIS — R627 Adult failure to thrive: Secondary | ICD-10-CM | POA: Diagnosis present

## 2017-03-04 DIAGNOSIS — Z885 Allergy status to narcotic agent status: Secondary | ICD-10-CM | POA: Diagnosis not present

## 2017-03-04 DIAGNOSIS — M79604 Pain in right leg: Secondary | ICD-10-CM | POA: Diagnosis present

## 2017-03-04 DIAGNOSIS — F0390 Unspecified dementia without behavioral disturbance: Secondary | ICD-10-CM | POA: Diagnosis present

## 2017-03-04 DIAGNOSIS — Z87891 Personal history of nicotine dependence: Secondary | ICD-10-CM | POA: Diagnosis not present

## 2017-03-04 DIAGNOSIS — M5117 Intervertebral disc disorders with radiculopathy, lumbosacral region: Secondary | ICD-10-CM | POA: Diagnosis present

## 2017-03-04 DIAGNOSIS — E872 Acidosis: Secondary | ICD-10-CM | POA: Diagnosis present

## 2017-03-04 DIAGNOSIS — E860 Dehydration: Secondary | ICD-10-CM | POA: Diagnosis present

## 2017-03-04 DIAGNOSIS — M4310 Spondylolisthesis, site unspecified: Secondary | ICD-10-CM | POA: Diagnosis present

## 2017-03-04 DIAGNOSIS — E871 Hypo-osmolality and hyponatremia: Secondary | ICD-10-CM | POA: Diagnosis present

## 2017-03-04 DIAGNOSIS — I252 Old myocardial infarction: Secondary | ICD-10-CM | POA: Diagnosis not present

## 2017-03-04 DIAGNOSIS — Z955 Presence of coronary angioplasty implant and graft: Secondary | ICD-10-CM

## 2017-03-04 DIAGNOSIS — Z6824 Body mass index (BMI) 24.0-24.9, adult: Secondary | ICD-10-CM

## 2017-03-04 DIAGNOSIS — M79606 Pain in leg, unspecified: Secondary | ICD-10-CM

## 2017-03-04 DIAGNOSIS — Z8673 Personal history of transient ischemic attack (TIA), and cerebral infarction without residual deficits: Secondary | ICD-10-CM

## 2017-03-04 DIAGNOSIS — E222 Syndrome of inappropriate secretion of antidiuretic hormone: Secondary | ICD-10-CM | POA: Diagnosis present

## 2017-03-04 DIAGNOSIS — Z79899 Other long term (current) drug therapy: Secondary | ICD-10-CM

## 2017-03-04 DIAGNOSIS — K2980 Duodenitis without bleeding: Secondary | ICD-10-CM | POA: Diagnosis present

## 2017-03-04 DIAGNOSIS — G9341 Metabolic encephalopathy: Secondary | ICD-10-CM

## 2017-03-04 DIAGNOSIS — E440 Moderate protein-calorie malnutrition: Secondary | ICD-10-CM | POA: Diagnosis present

## 2017-03-04 DIAGNOSIS — Z882 Allergy status to sulfonamides status: Secondary | ICD-10-CM

## 2017-03-04 DIAGNOSIS — I2510 Atherosclerotic heart disease of native coronary artery without angina pectoris: Secondary | ICD-10-CM | POA: Diagnosis present

## 2017-03-04 DIAGNOSIS — R109 Unspecified abdominal pain: Secondary | ICD-10-CM

## 2017-03-04 DIAGNOSIS — D649 Anemia, unspecified: Secondary | ICD-10-CM | POA: Diagnosis present

## 2017-03-04 DIAGNOSIS — R933 Abnormal findings on diagnostic imaging of other parts of digestive tract: Secondary | ICD-10-CM | POA: Diagnosis not present

## 2017-03-04 DIAGNOSIS — Z96651 Presence of right artificial knee joint: Secondary | ICD-10-CM | POA: Diagnosis present

## 2017-03-04 DIAGNOSIS — R634 Abnormal weight loss: Secondary | ICD-10-CM

## 2017-03-04 DIAGNOSIS — K219 Gastro-esophageal reflux disease without esophagitis: Secondary | ICD-10-CM | POA: Diagnosis present

## 2017-03-04 DIAGNOSIS — M4726 Other spondylosis with radiculopathy, lumbar region: Secondary | ICD-10-CM | POA: Diagnosis present

## 2017-03-04 DIAGNOSIS — Z9849 Cataract extraction status, unspecified eye: Secondary | ICD-10-CM | POA: Diagnosis not present

## 2017-03-04 DIAGNOSIS — E46 Unspecified protein-calorie malnutrition: Secondary | ICD-10-CM

## 2017-03-04 DIAGNOSIS — R296 Repeated falls: Secondary | ICD-10-CM | POA: Diagnosis present

## 2017-03-04 DIAGNOSIS — Z9889 Other specified postprocedural states: Secondary | ICD-10-CM

## 2017-03-04 DIAGNOSIS — Z7982 Long term (current) use of aspirin: Secondary | ICD-10-CM

## 2017-03-04 DIAGNOSIS — R4189 Other symptoms and signs involving cognitive functions and awareness: Secondary | ICD-10-CM | POA: Diagnosis present

## 2017-03-04 DIAGNOSIS — I739 Peripheral vascular disease, unspecified: Secondary | ICD-10-CM | POA: Diagnosis present

## 2017-03-04 HISTORY — DX: Cerebral infarction, unspecified: I63.9

## 2017-03-04 LAB — CBC WITH DIFFERENTIAL/PLATELET
BASOS ABS: 0 10*3/uL (ref 0–0.1)
BASOS PCT: 1 %
EOS ABS: 0.3 10*3/uL (ref 0–0.7)
Eosinophils Relative: 4 %
HEMATOCRIT: 29.4 % — AB (ref 40.0–52.0)
HEMOGLOBIN: 10.4 g/dL — AB (ref 13.0–18.0)
Lymphocytes Relative: 12 %
Lymphs Abs: 0.9 10*3/uL — ABNORMAL LOW (ref 1.0–3.6)
MCH: 33.5 pg (ref 26.0–34.0)
MCHC: 35.3 g/dL (ref 32.0–36.0)
MCV: 94.9 fL (ref 80.0–100.0)
MONOS PCT: 10 %
Monocytes Absolute: 0.8 10*3/uL (ref 0.2–1.0)
Neutro Abs: 5.5 10*3/uL (ref 1.4–6.5)
Neutrophils Relative %: 73 %
Platelets: 204 10*3/uL (ref 150–440)
RBC: 3.09 MIL/uL — ABNORMAL LOW (ref 4.40–5.90)
RDW: 14 % (ref 11.5–14.5)
WBC: 7.5 10*3/uL (ref 3.8–10.6)

## 2017-03-04 LAB — BASIC METABOLIC PANEL
ANION GAP: 6 (ref 5–15)
BUN: 26 mg/dL — ABNORMAL HIGH (ref 6–20)
CO2: 23 mmol/L (ref 22–32)
CREATININE: 1.12 mg/dL (ref 0.61–1.24)
Calcium: 9 mg/dL (ref 8.9–10.3)
Chloride: 95 mmol/L — ABNORMAL LOW (ref 101–111)
GFR calc non Af Amer: 59 mL/min — ABNORMAL LOW (ref >60)
Glucose, Bld: 96 mg/dL (ref 65–99)
Potassium: 4.5 mmol/L (ref 3.5–5.1)
SODIUM: 124 mmol/L — AB (ref 135–145)

## 2017-03-04 LAB — MAGNESIUM: MAGNESIUM: 1.8 mg/dL (ref 1.7–2.4)

## 2017-03-04 LAB — TROPONIN I

## 2017-03-04 MED ORDER — SIMVASTATIN 20 MG PO TABS
10.0000 mg | ORAL_TABLET | Freq: Every day | ORAL | Status: DC
  Administered 2017-03-04 – 2017-03-11 (×8): 10 mg via ORAL
  Filled 2017-03-04 (×8): qty 1

## 2017-03-04 MED ORDER — ASPIRIN 81 MG PO CHEW
324.0000 mg | CHEWABLE_TABLET | Freq: Once | ORAL | Status: AC
  Administered 2017-03-04: 324 mg via ORAL
  Filled 2017-03-04: qty 4

## 2017-03-04 MED ORDER — ALUM & MAG HYDROXIDE-SIMETH 200-200-20 MG/5ML PO SUSP: 30.0000 mL | Freq: Four times a day (QID) | ORAL | Status: DC | PRN

## 2017-03-04 MED ORDER — SODIUM CHLORIDE 0.9 % IV SOLN
INTRAVENOUS | Status: DC
  Administered 2017-03-04 – 2017-03-06 (×5): via INTRAVENOUS

## 2017-03-04 MED ORDER — ASPIRIN 81 MG PO CHEW
81.0000 mg | CHEWABLE_TABLET | Freq: Every day | ORAL | Status: DC
  Administered 2017-03-05 – 2017-03-11 (×7): 81 mg via ORAL
  Filled 2017-03-04 (×7): qty 1

## 2017-03-04 MED ORDER — FERROUS SULFATE 325 (65 FE) MG PO TABS
325.0000 mg | ORAL_TABLET | ORAL | Status: DC
  Administered 2017-03-04 – 2017-03-11 (×4): 325 mg via ORAL
  Filled 2017-03-04 (×4): qty 1

## 2017-03-04 MED ORDER — GABAPENTIN 100 MG PO CAPS
100.0000 mg | ORAL_CAPSULE | Freq: Every day | ORAL | Status: DC
  Administered 2017-03-04 – 2017-03-11 (×8): 100 mg via ORAL
  Filled 2017-03-04 (×8): qty 1

## 2017-03-04 MED ORDER — PANTOPRAZOLE SODIUM 40 MG PO TBEC
40.0000 mg | DELAYED_RELEASE_TABLET | Freq: Every day | ORAL | Status: DC
  Administered 2017-03-04 – 2017-03-05 (×2): 40 mg via ORAL
  Filled 2017-03-04 (×2): qty 1

## 2017-03-04 MED ORDER — ONDANSETRON HCL 4 MG PO TABS
4.0000 mg | ORAL_TABLET | Freq: Four times a day (QID) | ORAL | Status: DC | PRN
Start: 2017-03-04 — End: 2017-03-12

## 2017-03-04 MED ORDER — ALBUTEROL SULFATE (2.5 MG/3ML) 0.083% IN NEBU: 2.5000 mg | INHALATION_SOLUTION | RESPIRATORY_TRACT | Status: DC | PRN

## 2017-03-04 MED ORDER — ONDANSETRON HCL 4 MG/2ML IJ SOLN: 4.0000 mg | Freq: Four times a day (QID) | INTRAMUSCULAR | Status: DC | PRN

## 2017-03-04 MED ORDER — HYDRALAZINE HCL 20 MG/ML IJ SOLN: 10.0000 mg | Freq: Four times a day (QID) | INTRAMUSCULAR | Status: DC | PRN

## 2017-03-04 MED ORDER — SENNOSIDES-DOCUSATE SODIUM 8.6-50 MG PO TABS: 1.0000 | ORAL_TABLET | Freq: Every evening | ORAL | Status: DC | PRN

## 2017-03-04 MED ORDER — VITAMIN D 1000 UNITS PO TABS
1000.0000 [IU] | ORAL_TABLET | Freq: Every day | ORAL | Status: DC
  Administered 2017-03-04 – 2017-03-11 (×8): 1000 [IU] via ORAL
  Filled 2017-03-04 (×8): qty 1

## 2017-03-04 MED ORDER — ENOXAPARIN SODIUM 40 MG/0.4ML ~~LOC~~ SOLN
40.0000 mg | SUBCUTANEOUS | Status: DC
  Administered 2017-03-04 – 2017-03-10 (×6): 40 mg via SUBCUTANEOUS
  Filled 2017-03-04 (×7): qty 0.4

## 2017-03-04 MED ORDER — SODIUM CHLORIDE 0.9 % IV SOLN
Freq: Once | INTRAVENOUS | Status: AC
  Administered 2017-03-04: 11:00:00 via INTRAVENOUS

## 2017-03-04 MED ORDER — VITAMIN C 500 MG PO TABS
500.0000 mg | ORAL_TABLET | Freq: Every day | ORAL | Status: DC
  Administered 2017-03-04 – 2017-03-11 (×8): 500 mg via ORAL
  Filled 2017-03-04 (×8): qty 1

## 2017-03-04 MED ORDER — METOPROLOL TARTRATE 25 MG PO TABS
12.5000 mg | ORAL_TABLET | Freq: Two times a day (BID) | ORAL | Status: DC
  Administered 2017-03-04 – 2017-03-11 (×11): 12.5 mg via ORAL
  Filled 2017-03-04 (×7): qty 1
  Filled 2017-03-04: qty 2
  Filled 2017-03-04 (×6): qty 1

## 2017-03-04 MED ORDER — ENSURE ENLIVE PO LIQD
237.0000 mL | Freq: Three times a day (TID) | ORAL | Status: DC
  Administered 2017-03-04 – 2017-03-08 (×7): 237 mL via ORAL

## 2017-03-04 NOTE — Plan of Care (Signed)
Problem: Physical Regulation: Goal: Ability to maintain clinical measurements within normal limits will improve Outcome: Progressing Pt admitted today from the ED. VSS. Denies pain. Alert to person and place. Sodium level 124. IVF infusing.

## 2017-03-04 NOTE — ED Provider Notes (Signed)
[facility] Emergency Department Provider Note  ____________________________________________   First MD Initiated Contact with Patient 03/04/17 [phone]     (approximate)  I have reviewed the triage vital signs and the nursing notes.   HISTORY  Chief Complaint Chest Pain   HPI Bobby [name] is a 83 y.o. male  with history of coronary artery disease with CABG and stenting who is presenting emergency department today after an episode of "indigestion" which occurred prior to arrival. The patient denies any indigestion and just says that he has a cramping in his right lower extremity which has been ongoing since a recent fall. He is denying any shortness of breath. No nausea or vomiting. Denies taking any aspirin today, although he does take this on a regular basis.   Past Medical History:  Diagnosis Date  . Arthritis    (R)TKR 5/13 [facility]  . Coronary artery disease    2 stents  . GERD (gastroesophageal reflux disease)   . Myocardial infarction    NSTEMI 11/11/12 @ [facility]  . Peripheral vascular disease [facility])    (L)CEA 2007 [place]  . PONV (postoperative nausea and vomiting)     Patient Active Problem List   Diagnosis Date Noted  . Hyponatremia 12/06/2016  . Inguinal hernia with history of incarceration-left 12/20/2012  . Coronary artery disease 11/21/2012  . Carotid artery disease (HCC) 11/21/2012  . S/P carotid endarterectomy 11/21/2012  . Stented coronary artery 11/21/2012  . S/P CABG x 4 11/21/2012    Past Surgical History:  Procedure Laterality Date  . CARDIAC CATHETERIZATION     11/11/12 [facility],2000,2006 w/ stents  . CATARACT EXTRACTION    . CORONARY ARTERY BYPASS GRAFT  11/16/2012   Procedure: CORONARY ARTERY BYPASS GRAFTING (CABG);  Surgeon: Kerin Perna, MD;  Location: [facility] OR;  Service: Open Heart Surgery;  Laterality: N/A;  times four using Bilateral Greater Saphenous Vein Graft harvested endoscopically from bilateral thighs, and Left  Internal Mammary Artery.  Marland Kitchen HERNIA REPAIR  01/17/13   Dr Renda Rolls @ [facility]  . INTRAOPERATIVE TRANSESOPHAGEAL ECHOCARDIOGRAM  11/16/2012   Procedure: INTRAOPERATIVE TRANSESOPHAGEAL ECHOCARDIOGRAM;  Surgeon: Kerin Perna, MD;  Location: [facility] OR;  Service: Open Heart Surgery;  Laterality: N/A;  . JOINT REPLACEMENT     (R)TKR 5/13 [facility]  . VASCULAR SURGERY     (R)TKR 5/13 [facility]    Prior to Admission medications   Medication Sig Start Date End Date Taking? Authorizing Provider  amLODipine (NORVASC) 5 MG tablet Take 1 tablet (5 mg total) by mouth daily. 12/10/16   Enid Baas, MD  aspirin 81 MG tablet Take 325 mg by mouth daily. For pain    Historical Provider, MD  Bee Pollen 1000 MG TABS Take 2,000 mg by mouth 2 (two) times daily.    Historical Provider, MD  Cholecalciferol (VITAMIN D3) 1000 units CAPS Take 1,000 Units by mouth daily.    Historical Provider, MD  ferrous sulfate 325 (65 FE) MG tablet Take 325 mg by mouth every Monday, Wednesday, and Friday.    Historical Provider, MD  metoprolol tartrate (LOPRESSOR) 25 MG tablet Take 0.5 tablets (12.5 mg total) by mouth 2 (two) times daily. 11/21/12   Erin R Barrett, PA-C  Multiple Vitamins-Minerals (OCUVITE PRESERVISION PO) Take 1 tablet by mouth 2 (two) times daily.    Historical Provider, MD  omeprazole (PRILOSEC) 20 MG capsule Take 20 mg by mouth daily.  11/28/12   Historical Provider, MD  senna-docusate (SENOKOT-S) 8.6-50 MG per  tablet Take 1 tablet by mouth daily as needed for constipation.    Historical Provider, MD  simvastatin (ZOCOR) 20 MG tablet Take 10 mg by mouth at bedtime.    Historical Provider, MD  vitamin C (ASCORBIC ACID) 500 MG tablet Take 500 mg by mouth daily.    Historical Provider, MD    Allergies Percocet [oxycodone-acetaminophen]; Septra [sulfamethoxazole-trimethoprim]; and Tramadol hcl  No family history on file.  Social History Social History  Substance Use Topics  . Smoking status: Former Smoker    Types:  Cigars    Quit date: 10/22/1989  . Smokeless tobacco: Never Used  . Alcohol use No    Review of Systems Constitutional: No fever/chills Eyes: No visual changes. ENT: No sore throat. Cardiovascular: Denies chest pain. Respiratory: Denies shortness of breath. Gastrointestinal: No abdominal pain.  No nausea, no vomiting.  No diarrhea.  No constipation. Genitourinary: Negative for dysuria. Musculoskeletal: Negative for back pain. Skin: Negative for rash. Neurological: Negative for headaches, focal weakness or numbness.  10-point ROS otherwise negative.  ____________________________________________   PHYSICAL EXAM:  VITAL SIGNS: ED Triage Vitals  Enc Vitals Group     BP      Pulse      Resp      Temp      Temp src      SpO2      Weight      Height      Head Circumference      Peak Flow      Pain Score      Pain Loc      Pain Edu?      Excl. in GC?     Constitutional: Alert and oriented. Well appearing and in no acute distress. Eyes: Conjunctivae are normal. PERRL. EOMI. Head: Atraumatic. Nose: No congestion/rhinnorhea. Mouth/Throat: Mucous membranes are moist.   Neck: No stridor.   Cardiovascular: Normal rate, regular rhythm. Grossly normal heart sounds.  Good peripheral circulation. Respiratory: Normal respiratory effort.  No retractions. Lungs CTAB. Gastrointestinal: Soft and nontender. No distention.  Musculoskeletal: No lower extremity tenderness nor edema.  No joint effusions.  5 out of 5 strength to the bilateral lower extremities. Bilateral lower extremities are warm and equal bilaterally. Dorsalis pedis as well as posterior tibial pulses are present and equal bilaterally. Sensation is intact to light touch to the bilateral lower extremities. Over the anterior right hip and thigh with patient is reporting the intermittent pain there is no tenderness, mass or deformity. No overlying bruising. Pelvis is stable.  Neurologic:  Normal speech and language. No gross  focal neurologic deficits are appreciated.  Skin:  Skin is warm, dry and intact. No rash noted. Psychiatric: Mood and affect are normal. Speech and behavior are normal.  ____________________________________________   LABS (all labs ordered are listed, but only abnormal results are displayed)  Labs Reviewed  CBC WITH DIFFERENTIAL/PLATELET - Abnormal; Notable for the following:       Result Value   RBC 3.09 (*)    Hemoglobin 10.4 (*)    HCT 29.4 (*)    Lymphs Abs 0.9 (*)    All other components within normal limits  BASIC METABOLIC PANEL - Abnormal; Notable for the following:    Sodium 124 (*)    Chloride 95 (*)    BUN 26 (*)    GFR calc non Af Amer 59 (*)    All other components within normal limits  TROPONIN I  TROPONIN I   ____________________________________________  EKG  ED ECG REPORT I, Arelia Longest, the attending physician, personally viewed and interpreted this ECG.   Date: 03/04/2017  EKG Time: 0859  Rate: 66  Rhythm: normal sinus rhythm  Axis: Normal  Intervals:right bundle branch block  ST&T Change: No ST segment elevation or depression. No abnormal T-wave inversion. Biphasic T waves in V2.  No significant change from EKG of 12/06/2016.  ____________________________________________  RADIOLOGY    DG Chest 1 View (Final result)  Result time 03/04/17 09:39:19  Final result by Bretta Bang III, MD (03/04/17 09:39:19)           Narrative:   CLINICAL DATA: Chest pain  EXAM: CHEST 1 VIEW  COMPARISON: Chest radiograph February 07, 2013  FINDINGS: There is scarring in the left base region. There is no edema or consolidation. Heart is upper normal in size with pulmonary vascularity within normal limits. Patient is status post coronary artery bypass grafting. No adenopathy. No evident bone lesions.  IMPRESSION: Scarring left base. No edema or consolidation. Stable cardiac prominence.   Electronically Signed By: Bretta Bang  III M.D. On: 03/04/2017 09:39            ____________________________________________   PROCEDURES  Procedure(s) performed:   Procedures  Critical Care performed:   ____________________________________________   INITIAL IMPRESSION / ASSESSMENT AND PLAN / ED COURSE  Pertinent labs & imaging results that were available during my care of the patient were reviewed by me and considered in my medical decision making (see chart for details).  ----------------------------------------- 10:25 AM on 03/04/2017 -----------------------------------------  Patient with hyponatremia which is lower than what appears to be his baseline. We'll admit to the hospital. Signed out to Dr. Imogene Burn. Family with multiple concerns including end-of-life care per patient does have an advanced directive which states that he does not want extraordinary and futile measures. The daughter-in-law is a copy of this on her phone and says she will be able to get a paper copy.      ____________________________________________   FINAL CLINICAL IMPRESSION(S) / ED DIAGNOSES  Hyponatremia. Abdominal pain. Altered mental status.    NEW MEDICATIONS STARTED DURING THIS VISIT:  New Prescriptions   No medications on file     Note:  This document was prepared using Dragon voice recognition software and may include unintentional dictation errors.    Myrna Blazer, MD 03/04/17 1026

## 2017-03-04 NOTE — H&P (Signed)
[employer] - [place] at [facility]   PATIENT NAME: Bobby [name]    MR#:  [id number]  DATE OF BIRTH:  [birth year]  DATE OF ADMISSION:  03/04/2017  PRIMARY CARE PHYSICIAN: Danella Penton, MD   REQUESTING/REFERRING PHYSICIAN: Myrna Blazer, MD  CHIEF COMPLAINT:   Chief Complaint  Patient presents with  . Chest Pain   Indigestion. HISTORY OF PRESENT ILLNESS:  Bobby [name]  is a 83 y.o. male with a known history of CAD, GERD, PVD and arthritis. The patient was sent to ED due to indigestion today. The patient looks confused, denies any symptoms. According to his wife and daughter, the patient general condition has been declining recently. He is confused and has generalized weakness. He was found low sodium at 124. Chest x-ray didn't show any infiltrate. ED physician requested admission.  PAST MEDICAL HISTORY:   Past Medical History:  Diagnosis Date  . Arthritis    (R)TKR 5/13 [facility]  . Coronary artery disease    2 stents  . GERD (gastroesophageal reflux disease)   . Myocardial infarction    NSTEMI 11/11/12 @ [facility]  . Peripheral vascular disease [facility])    (L)CEA 2007 [place]  . PONV (postoperative nausea and vomiting)     PAST SURGICAL HISTORY:   Past Surgical History:  Procedure Laterality Date  . CARDIAC CATHETERIZATION     11/11/12 [facility],2000,2006 w/ stents  . CATARACT EXTRACTION    . CORONARY ARTERY BYPASS GRAFT  11/16/2012   Procedure: CORONARY ARTERY BYPASS GRAFTING (CABG);  Surgeon: Kerin Perna, MD;  Location: [facility] OR;  Service: Open Heart Surgery;  Laterality: N/A;  times four using Bilateral Greater Saphenous Vein Graft harvested endoscopically from bilateral thighs, and Left Internal Mammary Artery.  Marland Kitchen HERNIA REPAIR  01/17/13   Dr Renda Rolls @ [facility]  . INTRAOPERATIVE TRANSESOPHAGEAL ECHOCARDIOGRAM  11/16/2012   Procedure: INTRAOPERATIVE TRANSESOPHAGEAL ECHOCARDIOGRAM;  Surgeon: Kerin Perna, MD;  Location: [facility] OR;  Service: Open  Heart Surgery;  Laterality: N/A;  . JOINT REPLACEMENT     (R)TKR 5/13 [facility]  . VASCULAR SURGERY     (R)TKR 5/13 [facility]    SOCIAL HISTORY:   Social History  Substance Use Topics  . Smoking status: Former Smoker    Types: Cigars    Quit date: 10/22/1989  . Smokeless tobacco: Never Used  . Alcohol use No    FAMILY HISTORY:  No family history on file.  DRUG ALLERGIES:   Allergies  Allergen Reactions  . Percocet [Oxycodone-Acetaminophen]     Hallucinations   . Septra [Sulfamethoxazole-Trimethoprim]   . Tramadol Hcl Other (See Comments)    Hallucinations    REVIEW OF SYSTEMS:   Review of Systems  Unable to perform ROS: Mental status change    MEDICATIONS AT HOME:   Prior to Admission medications   Medication Sig Start Date End Date Taking? Authorizing Provider  aspirin 81 MG tablet Take 81 mg by mouth daily. For pain   Yes Historical Provider, MD  Bee Pollen 1000 MG TABS Take 2,000 mg by mouth 2 (two) times daily.   Yes Historical Provider, MD  Cholecalciferol (VITAMIN D3) 1000 units CAPS Take 1,000 Units by mouth daily.   Yes Historical Provider, MD  etodolac (LODINE) 400 MG tablet Take 400 mg by mouth 2 (two) times daily.   Yes Historical Provider, MD  ferrous sulfate 325 (65 FE) MG tablet Take 325 mg by mouth every Monday, Wednesday, and Friday.   Yes Historical Provider,  MD  gabapentin (NEURONTIN) 100 MG capsule Take 100 mg by mouth at bedtime.   Yes Historical Provider, MD  metoprolol tartrate (LOPRESSOR) 25 MG tablet Take 0.5 tablets (12.5 mg total) by mouth 2 (two) times daily. 11/21/12  Yes Erin R Barrett, PA-C  Multiple Vitamins-Minerals (OCUVITE PRESERVISION PO) Take 1 tablet by mouth 2 (two) times daily.   Yes Historical Provider, MD  omeprazole (PRILOSEC) 20 MG capsule Take 20 mg by mouth daily.  11/28/12  Yes Historical Provider, MD  simvastatin (ZOCOR) 20 MG tablet Take 10 mg by mouth at bedtime.   Yes Historical Provider, MD  vitamin C (ASCORBIC ACID) 500 MG  tablet Take 500 mg by mouth daily.   Yes Historical Provider, MD  senna-docusate (SENOKOT-S) 8.6-50 MG per tablet Take 1 tablet by mouth daily as needed for constipation.    Historical Provider, MD      VITAL SIGNS:  Blood pressure (!) 151/67, pulse 67, temperature 97.9 F (36.6 C), temperature source Oral, resp. rate 20, height  (1.676 m), weight 154 lb (69.9 kg), SpO2 100 %.  PHYSICAL EXAMINATION:  Physical Exam  GENERAL:  83 y.o.-year-old patient lying in the bed with no acute distress.  EYES: Pupils equal, round, reactive to light and accommodation. No scleral icterus. Extraocular muscles intact.  HEENT: Head atraumatic, normocephalic. Oropharynx and nasopharynx clear.  NECK:  Supple, no jugular venous distention. No thyroid enlargement, no tenderness.  LUNGS: Normal breath sounds bilaterally, no wheezing, rales,rhonchi or crepitation. No use of accessory muscles of respiration.  CARDIOVASCULAR: S1, S2 normal. No murmurs, rubs, or gallops.  ABDOMEN: Soft, nontender, nondistended. Bowel sounds present. No organomegaly or mass.  EXTREMITIES: No pedal edema, cyanosis, or clubbing.  NEUROLOGIC: Cranial nerves II through XII are intact. Muscle strength 3-4/5 in all extremities. Sensation intact. Gait not checked.  PSYCHIATRIC: The patient is alert and oriented x 2.  SKIN: No obvious rash, lesion, or ulcer.   LABORATORY PANEL:   CBC  Recent Labs Lab 03/04/17 0906  WBC 7.5  HGB 10.4*  HCT 29.4*  PLT 204   ------------------------------------------------------------------------------------------------------------------  Chemistries   Recent Labs Lab 03/04/17 0906  NA 124*  K 4.5  CL 95*  CO2 23  GLUCOSE 96  BUN 26*  CREATININE 1.12  CALCIUM 9.0   ------------------------------------------------------------------------------------------------------------------  Cardiac Enzymes  Recent Labs Lab 03/04/17 0906  TROPONINI <0.03    ------------------------------------------------------------------------------------------------------------------  RADIOLOGY:  Dg Chest 1 View  Result Date: 03/04/2017 CLINICAL DATA:  Chest pain EXAM: CHEST 1 VIEW COMPARISON:  Chest radiograph February 07, 2013 FINDINGS: There is scarring in the left base region. There is no edema or consolidation. Heart is upper normal in size with pulmonary vascularity within normal limits. Patient is status post coronary artery bypass grafting. No adenopathy. No evident bone lesions. IMPRESSION: Scarring left base. No edema or consolidation. Stable cardiac prominence. Electronically Signed   By: Bretta Bang III M.D.   On: 03/04/2017 09:39      IMPRESSION AND PLAN:   Hyponatremia. The patient will be admitted to medical floor. Start normal saline IV, follow-up BMP.  Dehydration. Continue normal saline and follow-up BMP.  Acute metabolic encephalopathy due to above. Aspiration and fall precaution.  Generalized weakness. PT evaluation. Social worker consult for possible placement.  CAD. Continue aspirin and the statin.  All the records are reviewed and case discussed with ED provider. Management plans discussed with the patient's Wife and daughter and they are in agreement.  CODE STATUS: DNR  TOTAL TIME  TAKING CARE OF THIS PATIENT: 52 minutes.    Shaune Pollack M.D on 03/04/2017 at 11:11 AM  Between 7am to 6pm - Pager - [phone]  After 6pm go to [email or web address] - Social research officer, government  [facility] [place] [facility]  Office  [phone]  CC: Primary care physician; Danella Penton, MD   Note: This dictation was prepared with Dragon dictation along with smaller phrase technology. Any transcriptional errors that result from this process are unintentional.

## 2017-03-04 NOTE — Telephone Encounter (Signed)
Request notes from [facility], Jorja Loa, MD  [address] [facility] - CARDIOLOGY [place], [place] [address] [phone] and from  [facility] [place], [place] [address]. Appt needs to be made

## 2017-03-04 NOTE — Progress Notes (Signed)
Advanced Care Plan.  Purpose of Encounter:   Parties in Attendance: The patient, his wife and daughter, and me.  Patient's Decisional Capacity: NO.  Medical Story: The patient is being admitted for hyponatremia, generalized weakness and acute Encephalopathy. According to his wife and daughter, the patient's general condition is declining recently. The patient has worsening generalized weakness, may need skilled nursing facility placement.. I discussed about CODE STATUS with his wife (POA) and daughter. They said the patient has documentation of living will for DO NOT RESUSCITATE. They want the patient in DO NOT RESUSCITATE status.  Plan:  Code Status:  CODE STATUS.:  Time spent discussing advance care planning: 18 minutes.

## 2017-03-04 NOTE — ED Triage Notes (Signed)
Pt from home brought in by [facility] for complaints of chest pain. Pt reports earlier upper gas like pain. Pt denies any pain at this time. Pt reports right leg pain. EMS reports vital signs stable. EKG unremarkable per EMS. Pt respirations even and unlabored.

## 2017-03-04 NOTE — ED Notes (Signed)
Pt given apple sauce

## 2017-03-04 NOTE — Progress Notes (Signed)
Initial Nutrition Assessment  DOCUMENTATION CODES:   Severe malnutrition in context of chronic illness  INTERVENTION:  Provide Ensure Enlive po TID, each supplement provides 350 kcal and 20 grams of protein.   NUTRITION DIAGNOSIS:   Malnutrition (Severe) related to chronic illness as evidenced by severe depletion of body fat, severe depletion of muscle mass.  GOAL:   Patient will meet greater than or equal to 90% of their needs  MONITOR:   PO intake, Supplement acceptance, Labs, Weight trends, I & O's  REASON FOR ASSESSMENT:   Malnutrition Screening Tool    ASSESSMENT:   83 year old male with PMHx of CAD, MI in 2013, PVD, history of stroke admitted with hyponatremia, dehydration, metabolic encephalopathy, weakness.   Attempted to speak with patient at bedside but he was not a great historian. He reports his appetite is good and he is eating well. He reports eating 1-2 meals per day. Patient denies any weight loss but per RN patient's family reports patient has lost 30 lbs in the past year. Patient endorses occasional nausea and abdominal pain, difficulty swallowing. Denies constipation/diarrhea. Also per RN patient has not been ambulatory the past 1.5 months approximately.   Previous weights in chart of 180 lbs. Weight entered by NT earlier today is 168 lbs. RD obtained bed scale weight of 148 lbs, which is significantly lower, but more in line with family's report. They report he has lost 30 lbs (16.7% body weight) over the past year, which is not significant for time frame.  Medications reviewed and include: vitamin D 1000 units daily, ferrous sulfate 325 mg every M/W/F, pantoprazole, vitamin C 500 mg daily, NS @ 75 ml/hr.  Labs reviewed: Sodium 124, Chloride 95, BUN 26.   Nutrition-Focused physical exam completed. Findings are severe fat depletion, severe muscle depletion, and no edema.   Discussed with RN.   Diet Order:  DIET SOFT Room service appropriate? Yes; Fluid  consistency: Thin  Skin:  Reviewed, no issues  Last BM:  Unknown  Height:   Ht Readings from Last 1 Encounters:  03/04/17  (1.676 m)    Weight:   Wt Readings from Last 1 Encounters:  03/04/17 148 lb (67.1 kg)    Ideal Body Weight:  64.5 kg  BMI:  Body mass index is 23.89 kg/m.  Estimated Nutritional Needs:   Kcal:  1585-1850 (MSJ x 1.2-1.4)  Protein:  80-95 grams (1.2-1.4 grams/kg)  Fluid:  1.7 L/day (25 ml/kg)  EDUCATION NEEDS:   Education needs no appropriate at this time  Helane Rima, MS, RD, LDN Pager: [phone] After Hours Pager: [phone]

## 2017-03-05 LAB — URINALYSIS, ROUTINE W REFLEX MICROSCOPIC
Bilirubin Urine: NEGATIVE
Glucose, UA: NEGATIVE mg/dL
Hgb urine dipstick: NEGATIVE
Ketones, ur: NEGATIVE mg/dL
LEUKOCYTES UA: NEGATIVE
Nitrite: NEGATIVE
PROTEIN: NEGATIVE mg/dL
SPECIFIC GRAVITY, URINE: 1.008 (ref 1.005–1.030)
pH: 7 (ref 5.0–8.0)

## 2017-03-05 LAB — BASIC METABOLIC PANEL
Anion gap: 6 (ref 5–15)
BUN: 20 mg/dL (ref 6–20)
CHLORIDE: 98 mmol/L — AB (ref 101–111)
CO2: 23 mmol/L (ref 22–32)
CREATININE: 1.02 mg/dL (ref 0.61–1.24)
Calcium: 9.2 mg/dL (ref 8.9–10.3)
GFR calc non Af Amer: >60 mL/min (ref >60)
Glucose, Bld: 86 mg/dL (ref 65–99)
Potassium: 4.6 mmol/L (ref 3.5–5.1)
SODIUM: 127 mmol/L — AB (ref 135–145)

## 2017-03-05 LAB — OSMOLALITY, URINE: Osmolality, Ur: 326 mOsm/kg (ref 300–900)

## 2017-03-05 LAB — TROPONIN I

## 2017-03-05 MED ORDER — LIDOCAINE-PRILOCAINE 2.5-2.5 % EX CREA
TOPICAL_CREAM | CUTANEOUS | Status: DC | PRN
  Administered 2017-03-07 – 2017-03-08 (×2): via TOPICAL
  Filled 2017-03-05 (×2): qty 5

## 2017-03-05 MED ORDER — PANTOPRAZOLE SODIUM 40 MG PO TBEC
40.0000 mg | DELAYED_RELEASE_TABLET | Freq: Two times a day (BID) | ORAL | Status: DC
Start: 2017-03-05 — End: 2017-03-12
  Administered 2017-03-05 – 2017-03-11 (×13): 40 mg via ORAL
  Filled 2017-03-05 (×14): qty 1

## 2017-03-05 NOTE — Clinical Social Work Note (Signed)
CSW received consult for nursing home placement. The CSW will follow pending the PT recommendations.  Argentina Ponder, MSW, Theresia Majors [phone]

## 2017-03-05 NOTE — Progress Notes (Addendum)
Pt's daughter-in-law Aggie Cosier) is here at bedside and informed me that they prefer for the pt to be seen by [facility]. Dr. Juliann Pares has already been consulted on pt today (he is from [facility]). Our secretary has been made aware and she is calling Dr. Mariah Milling (the physician on call for [facility]) to inform him of the situation. Charge nurse is also aware. Additionally, Aggie Cosier states that the patient is being followed by neurosurgery at [facility]. His neurosurgeon at [facility] is Dr. Danielle Dess, and that the family does not want any other neurosurgeon to see the pt. According to Aggie Cosier, the pt has an appointment with Dr. Danielle Dess this upcoming Thursday. Dr. Winona Legato has been updated via text-page system. No new orders received.   Update 1350: I spoke to Dr. Winona Legato over the phone, and gave update on pt's status. No new orders received.

## 2017-03-05 NOTE — [place] FL2 (Signed)
[place] MEDICAID FL2 LEVEL OF CARE SCREENING TOOL     IDENTIFICATION  Patient Name: Bobby [name] Birthdate: [birth year] Sex: male Admission Date (Current Location): 03/04/2017  [place] and [place] Number:  Chiropodist and Address:  [facility], [address], [place], [place] [address]      Provider Number: [id number]  Attending Physician Name and Address:  Katharina Caper, MD  Relative Name and Phone Number:       Current Level of Care: Hospital Recommended Level of Care: Skilled Nursing Facility Prior Approval Number:    Date Approved/Denied: 03/05/17 PASRR Number: [id number]  Discharge Plan: SNF    Current Diagnoses: Patient Active Problem List   Diagnosis Date Noted  . Hyponatremia 12/06/2016  . Inguinal hernia with history of incarceration-left 12/20/2012  . Coronary artery disease 11/21/2012  . Carotid artery disease (HCC) 11/21/2012  . S/P carotid endarterectomy 11/21/2012  . Stented coronary artery 11/21/2012  . S/P CABG x 4 11/21/2012    Orientation RESPIRATION BLADDER Height & Weight     Self, Time, Situation  Normal Continent Weight: 148 lb (67.1 kg) (bed scale) Height:   (167.6 cm)  BEHAVIORAL SYMPTOMS/MOOD NEUROLOGICAL BOWEL NUTRITION STATUS      Continent Diet (DIET SOFT )  AMBULATORY STATUS COMMUNICATION OF NEEDS Skin   Extensive Assist Verbally Normal                       Personal Care Assistance Level of Assistance  Bathing, Feeding, Dressing Bathing Assistance: Limited assistance Feeding assistance: Independent Dressing Assistance: Limited assistance     Functional Limitations Info             SPECIAL CARE FACTORS FREQUENCY  PT (By licensed PT)     PT Frequency: Up to 5X per day 5 days per week              Contractures Contractures Info: Present    Additional Factors Info  Code Status, Allergies Code Status Info: DNR Allergies Info:  Percocet Oxycodone-acetaminophen,  Septra Sulfamethoxazole-trimethoprim, Tramadol Hcl           Current Medications (03/05/2017):  This is the current hospital active medication list Current Facility-Administered Medications  Medication Dose Route Frequency Provider Last Rate Last Dose  . 0.9 %  sodium chloride infusion   Intravenous Continuous Shaune Pollack, MD 75 mL/hr at 03/05/17 0500    . albuterol (PROVENTIL) (2.5 MG/3ML) 0.083% nebulizer solution 2.5 mg  2.5 mg Nebulization Q2H PRN Shaune Pollack, MD      . alum & mag hydroxide-simeth (MAALOX/MYLANTA) 200-200-20 MG/5ML suspension 30 mL  30 mL Oral Q6H PRN Shaune Pollack, MD      . aspirin chewable tablet 81 mg  81 mg Oral Daily Shaune Pollack, MD   81 mg at 03/05/17 5621  . cholecalciferol (VITAMIN D) tablet 1,000 Units  1,000 Units Oral Daily Shaune Pollack, MD   1,000 Units at 03/05/17 [phone]  . enoxaparin (LOVENOX) injection 40 mg  40 mg Subcutaneous Q24H Shaune Pollack, MD   40 mg at 03/04/17 2049  . feeding supplement (ENSURE ENLIVE) (ENSURE ENLIVE) liquid 237 mL  237 mL Oral TID BM Shaune Pollack, MD   237 mL at 03/05/17 0928  . ferrous sulfate tablet 325 mg  325 mg Oral Q M,W,F Shaune Pollack, MD   325 mg at 03/04/17 1441  . gabapentin (NEURONTIN) capsule 100 mg  100 mg Oral QHS Shaune Pollack, MD   100  mg at 03/04/17 2049  . hydrALAZINE (APRESOLINE) injection 10 mg  10 mg Intravenous Q6H PRN Shaune Pollack, MD      . lidocaine-prilocaine (EMLA) cream   Topical PRN Katharina Caper, MD      . metoprolol tartrate (LOPRESSOR) tablet 12.5 mg  12.5 mg Oral BID Shaune Pollack, MD   Stopped at 03/05/17 [phone]  . ondansetron (ZOFRAN) tablet 4 mg  4 mg Oral Q6H PRN Shaune Pollack, MD       Or  . ondansetron [facility]) injection 4 mg  4 mg Intravenous Q6H PRN Shaune Pollack, MD      . pantoprazole (PROTONIX) EC tablet 40 mg  40 mg Oral Daily Shaune Pollack, MD   40 mg at 03/05/17 9604  . senna-docusate (Senokot-S) tablet 1 tablet  1 tablet Oral QHS PRN Shaune Pollack, MD      . simvastatin (ZOCOR) tablet 10 mg  10 mg Oral QHS Shaune Pollack, MD   10 mg at  03/04/17 2049  . vitamin C (ASCORBIC ACID) tablet 500 mg  500 mg Oral Daily Shaune Pollack, MD   500 mg at 03/05/17 5409     Discharge Medications: Please see discharge summary for a list of discharge medications.  Relevant Imaging Results:  Relevant Lab Results:   Additional Information SS# [id number]  Judi Cong, LCSW

## 2017-03-05 NOTE — Consult Note (Signed)
Reason for Consult:Chest Pain CAD Referring Physician: Dr Charissa Bash, Dr Emily Filbert primary  Bobby [name] is an 83 y.o. male.  HPI: Bobby [name] is a 83 y/o WM with known CADS Angina Syncope HTN Hyperlipidemia CABG 2013 GERD PCI stent CEA who been declining recently He was last admitted with  hyponatemia. His Na was 124 and he was having ataxia and poor balance. He has had some mild AMS also. No significant F/C/S. He c/o of vague chest and abdominal discomfort. No sob or swelling.This is his second admission for hyponatremia this year. He has unclear etiology for electrolyte im balance.  Past Medical History:  Diagnosis Date  . Arthritis    (R)TKR 5/13 [facility]  . Coronary artery disease    2 stents  . GERD (gastroesophageal reflux disease)   . Myocardial infarction ([place])    NSTEMI 11/11/12 @ [place]  . Peripheral vascular disease [facility])    (L)CEA 2007 [place]  . PONV (postoperative nausea and vomiting)   . Stroke [facility]) 2015    Past Surgical History:  Procedure Laterality Date  . CARDIAC CATHETERIZATION     11/11/12 [facility],2000,2006 w/ stents  . CATARACT EXTRACTION    . CORONARY ARTERY BYPASS GRAFT  11/16/2012   Procedure: CORONARY ARTERY BYPASS GRAFTING (CABG);  Surgeon: Ivin Poot, MD;  Location: [place];  Service: Open Heart Surgery;  Laterality: N/A;  times four using Bilateral Greater Saphenous Vein Graft harvested endoscopically from bilateral thighs, and Left Internal Mammary Artery.  Marland Kitchen HERNIA REPAIR  01/17/13   Dr Rochel Brome @ [facility]  . INTRAOPERATIVE TRANSESOPHAGEAL ECHOCARDIOGRAM  11/16/2012   Procedure: INTRAOPERATIVE TRANSESOPHAGEAL ECHOCARDIOGRAM;  Surgeon: Ivin Poot, MD;  Location: [place];  Service: Open Heart Surgery;  Laterality: N/A;  . JOINT REPLACEMENT     (R)TKR 5/13 [facility]  . VASCULAR SURGERY     (R)TKR 5/13 [facility]    History reviewed. No pertinent family history.  Social History:  reports that he quit smoking about 27 years ago. His smoking use included Cigars. He  has never used smokeless tobacco. He reports that he does not drink alcohol or use drugs.  Allergies:  Allergies  Allergen Reactions  . Percocet [Oxycodone-Acetaminophen]     Hallucinations   . Septra [Sulfamethoxazole-Trimethoprim]   . Tramadol Hcl Other (See Comments)    Hallucinations    Medications: I have reviewed the patient's current medications.  Results for orders placed or performed during the hospital encounter of 03/04/17 (from the past 48 hour(s))  CBC with Differential     Status: Abnormal   Collection Time: 03/04/17  9:06 AM  Result Value Ref Range   WBC 7.5 3.8 - 10.6 K/uL   RBC 3.09 (L) 4.40 - 5.90 MIL/uL   Hemoglobin 10.4 (L) 13.0 - 18.0 g/dL   HCT 29.4 (L) 40.0 - 52.0 %   MCV 94.9 80.0 - 100.0 fL   MCH 33.5 26.0 - 34.0 pg   MCHC 35.3 32.0 - 36.0 g/dL   RDW 14.0 11.5 - 14.5 %   Platelets 204 150 - 440 K/uL   Neutrophils Relative % 73 %   Neutro Abs 5.5 1.4 - 6.5 K/uL   Lymphocytes Relative 12 %   Lymphs Abs 0.9 (L) 1.0 - 3.6 K/uL   Monocytes Relative 10 %   Monocytes Absolute 0.8 0.2 - 1.0 K/uL   Eosinophils Relative 4 %   Eosinophils Absolute 0.3 0 - 0.7 K/uL   Basophils Relative 1 %   Basophils Absolute 0.0  0 - 0.1 K/uL  Basic metabolic panel     Status: Abnormal   Collection Time: 03/04/17  9:06 AM  Result Value Ref Range   Sodium 124 (L) 135 - 145 mmol/L   Potassium 4.5 3.5 - 5.1 mmol/L   Chloride 95 (L) 101 - 111 mmol/L   CO2 23 22 - 32 mmol/L   Glucose, Bld 96 65 - 99 mg/dL   BUN 26 (H) 6 - 20 mg/dL   Creatinine, Ser 1.12 0.61 - 1.24 mg/dL   Calcium 9.0 8.9 - 10.3 mg/dL   GFR calc non Af Amer 59 (L) >60 mL/min   GFR calc Af Amer >60 >60 mL/min    Comment: (NOTE) The eGFR has been calculated using the CKD EPI equation. This calculation has not been validated in all clinical situations. eGFR's persistently <60 mL/min signify possible Chronic Kidney Disease.    Anion gap 6 5 - 15  Troponin I     Status: None   Collection Time: 03/04/17   9:06 AM  Result Value Ref Range   Troponin I <0.03 <0.03 ng/mL  Magnesium     Status: None   Collection Time: 03/04/17  9:06 AM  Result Value Ref Range   Magnesium 1.8 1.7 - 2.4 mg/dL  Basic metabolic panel     Status: Abnormal   Collection Time: 03/05/17  9:15 AM  Result Value Ref Range   Sodium 127 (L) 135 - 145 mmol/L   Potassium 4.6 3.5 - 5.1 mmol/L   Chloride 98 (L) 101 - 111 mmol/L   CO2 23 22 - 32 mmol/L   Glucose, Bld 86 65 - 99 mg/dL   BUN 20 6 - 20 mg/dL   Creatinine, Ser 1.02 0.61 - 1.24 mg/dL   Calcium 9.2 8.9 - 10.3 mg/dL   GFR calc non Af Amer >60 >60 mL/min   GFR calc Af Amer >60 >60 mL/min    Comment: (NOTE) The eGFR has been calculated using the CKD EPI equation. This calculation has not been validated in all clinical situations. eGFR's persistently <60 mL/min signify possible Chronic Kidney Disease.    Anion gap 6 5 - 15  Troponin I     Status: None   Collection Time: 03/05/17  9:15 AM  Result Value Ref Range   Troponin I <0.03 <0.03 ng/mL    Dg Chest 1 View  Result Date: 03/04/2017 CLINICAL DATA:  Chest pain EXAM: CHEST 1 VIEW COMPARISON:  Chest radiograph February 07, 2013 FINDINGS: There is scarring in the left base region. There is no edema or consolidation. Heart is upper normal in size with pulmonary vascularity within normal limits. Patient is status post coronary artery bypass grafting. No adenopathy. No evident bone lesions. IMPRESSION: Scarring left base. No edema or consolidation. Stable cardiac prominence. Electronically Signed   By: Lowella Grip III M.D.   On: 03/04/2017 09:39    Review of Systems  Constitutional: Positive for malaise/fatigue and weight loss.  HENT: Negative.   Eyes: Negative.   Respiratory: Negative.   Cardiovascular: Positive for chest pain.  Gastrointestinal: Positive for abdominal pain and heartburn.  Genitourinary: Negative.   Musculoskeletal: Positive for myalgias.  Skin: Negative.   Neurological: Positive for  weakness.  Endo/Heme/Allergies: Negative.   Psychiatric/Behavioral: Negative.    Blood pressure (!) 145/61, pulse (!) 59, temperature 98.2 F (36.8 C), temperature source Oral, resp. rate 20, height 5' 6"  (1.676 m), weight 67.1 kg (148 lb), SpO2 98 %. Physical Exam  Nursing note and  vitals reviewed. Constitutional: He is oriented to person, place, and time. He appears well-developed and well-nourished.  HENT:  Head: Normocephalic and atraumatic.  Eyes: Conjunctivae and EOM are normal. Pupils are equal, round, and reactive to light.  Neck: Normal range of motion. Neck supple.  Cardiovascular: Normal rate and regular rhythm.   Murmur heard. Respiratory: Effort normal and breath sounds normal.  GI: Soft. Bowel sounds are normal.  Musculoskeletal: Normal range of motion.  Neurological: He is alert and oriented to person, place, and time. He has normal reflexes.  Skin: Skin is warm.  Psychiatric: He has a normal mood and affect.    Assessment/Plan: Hyponatriemia [place] CAD Abdominal Pain Weakness Dehydration Anemia  PLAN Correct electyes Na Hydration IV Consider Bobby [name] Consider GI for GERD Conservative cardiac therapy      Annaliesa Blann D Ayiana Winslett 03/05/2017, 11:53 AM

## 2017-03-05 NOTE — Clinical Social Work Note (Signed)
Clinical Social Work Assessment  Patient Details  Name: Bobby [name] MRN: [id number] Date of Birth: [birth year]  Date of referral:  03/05/17               Reason for consult:  Facility Placement                Permission sought to share information with:  Chartered certified accountant granted to share information::  Yes, Verbal Permission Granted  Name::        Agency::     Relationship::     Contact Information:     Housing/Transportation Living arrangements for the past 2 months:  Single Family Home Source of Information:  Patient, Adult Children, Spouse Patient Interpreter Needed:  None Criminal Activity/Legal Involvement Pertinent to Current Situation/Hospitalization:  No - Comment as needed Significant Relationships:  Adult Children, Spouse Lives with:  Spouse Do you feel safe going back to the place where you live?  Yes Need for family participation in patient care:  No (Coment)  Care giving concerns:  PT recommendation for SNF   Social Worker assessment / plan:  CSW met with patient, his wife, and his daughter-in-law at bedside to discuss dc planning. CSW provided a list of area SNFs and explained the referral process. The family indicated that [facility] is the first choice and [facility] is the second choice. The family also had questions about area resources for when he leaves the SNF. The CSW provided information about Meals on Wheels and [employer]. The CSW also reported that the primary CSW would be informed that the patient would like to apply for long term Medicaid through [employer].  The patient lives at home with his spouse, and they are on a fixed income. The patient is unable to drive, and he and his wife are becoming food insecure as his needs have changed. The patient's son and daughter in law are supportive; however, they are also limited with time and finances.   Employment status:  Retired Programmer, applications PT Recommendations:  [place] / Referral to community resources:  [place]  Patient/Family's Response to care:  Patient and family thanked CSW for assistance.  Patient/Family's Understanding of and Emotional Response to Diagnosis, Current Treatment, and Prognosis:  Patient and his family understand that his care needs are changing, and they are willing to adapt.  Emotional Assessment Appearance:  Appears stated age Attitude/Demeanor/Rapport:  Lethargic Affect (typically observed):  Accepting, Appropriate, Pleasant Orientation:  Oriented to Self, Oriented to Place, Oriented to Situation, Oriented to  Time Alcohol / Substance use:  Never Used Psych involvement (Current and /or in the community):  No (Comment)  Discharge Needs  Concerns to be addressed:  Care Coordination, Discharge Planning Concerns (Community resources) Readmission within the last 30 days:  No Current discharge risk:  Chronically ill Barriers to Discharge:  Continued Medical Work up   [employer], LCSW 03/05/2017, 2:36 PM

## 2017-03-05 NOTE — Consult Note (Addendum)
Referring Provider: Dr. Winona Legato Primary Care Physician:  Danella Penton, MD Primary Gastroenterologist:  Gentry Fitz  Reason for Consultation:  Failure to Thrive  HPI: Bobby [name] is a 83 y.o. male with multiple medical problems seen for a consult due to failure to thrive, dehydration, and hyponatremia. Na 124 on admit. Patient denies abdominal pain or dysphagia. He reports that he has been eating ok and nursing reports that he ate 100% of his lunch and dinner (soft diet). History of GERD. Unable to given me any history. He fell at home and reportedly is not able to walk due to right leg pain. Knows his birthday but does not know what year it is. Nursing reports he had a stool in the past 24 hours but consistency not known.   Past Medical History:  Diagnosis Date  . Arthritis    (R)TKR 5/13 [facility]  . Coronary artery disease    2 stents  . GERD (gastroesophageal reflux disease)   . Myocardial infarction ([facility])    [facility]I 11/11/12 @ [facility]  . Peripheral vascular disease [facility])    (L)CEA 2007 [place]  . PONV (postoperative nausea and vomiting)   . Stroke [facility]) 2015    Past Surgical History:  Procedure Laterality Date  . CARDIAC CATHETERIZATION     11/11/12 [facility],2000,2006 w/ stents  . CATARACT EXTRACTION    . CORONARY ARTERY BYPASS GRAFT  11/16/2012   Procedure: CORONARY ARTERY BYPASS GRAFTING (CABG);  Surgeon: Kerin Perna, MD;  Location: [facility] OR;  Service: Open Heart Surgery;  Laterality: N/A;  times four using Bilateral Greater Saphenous Vein Graft harvested endoscopically from bilateral thighs, and Left Internal Mammary Artery.  Marland Kitchen HERNIA REPAIR  01/17/13   Dr Renda Rolls @ [facility]  . INTRAOPERATIVE TRANSESOPHAGEAL ECHOCARDIOGRAM  11/16/2012   Procedure: INTRAOPERATIVE TRANSESOPHAGEAL ECHOCARDIOGRAM;  Surgeon: Kerin Perna, MD;  Location: [facility] OR;  Service: Open Heart Surgery;  Laterality: N/A;  . JOINT REPLACEMENT     (R)TKR 5/13 [facility]  . VASCULAR SURGERY     (R)TKR 5/13 [facility]     Prior to Admission medications   Medication Sig Start Date End Date Taking? Authorizing Provider  aspirin 81 MG tablet Take 81 mg by mouth daily. For pain   Yes Historical Provider, MD  Bee Pollen 1000 MG TABS Take 2,000 mg by mouth 2 (two) times daily.   Yes Historical Provider, MD  Cholecalciferol (VITAMIN D3) 1000 units CAPS Take 1,000 Units by mouth daily.   Yes Historical Provider, MD  etodolac (LODINE) 400 MG tablet Take 400 mg by mouth 2 (two) times daily.   Yes Historical Provider, MD  ferrous sulfate 325 (65 FE) MG tablet Take 325 mg by mouth every Monday, Wednesday, and Friday.   Yes Historical Provider, MD  gabapentin (NEURONTIN) 100 MG capsule Take 100 mg by mouth at bedtime.   Yes Historical Provider, MD  metoprolol tartrate (LOPRESSOR) 25 MG tablet Take 0.5 tablets (12.5 mg total) by mouth 2 (two) times daily. 11/21/12  Yes Erin R Barrett, PA-C  Multiple Vitamins-Minerals (OCUVITE PRESERVISION PO) Take 1 tablet by mouth 2 (two) times daily.   Yes Historical Provider, MD  omeprazole (PRILOSEC) 20 MG capsule Take 20 mg by mouth daily.  11/28/12  Yes Historical Provider, MD  simvastatin (ZOCOR) 20 MG tablet Take 10 mg by mouth at bedtime.   Yes Historical Provider, MD  vitamin C (ASCORBIC ACID) 500 MG tablet Take 500 mg by mouth daily.   Yes Historical Provider, MD  senna-docusate (SENOKOT-S) 8.6-50 MG per tablet Take 1 tablet by mouth daily as needed for constipation.    Historical Provider, MD    Scheduled Meds: . aspirin  81 mg Oral Daily  . cholecalciferol  1,000 Units Oral Daily  . enoxaparin (LOVENOX) injection  40 mg Subcutaneous Q24H  . feeding supplement (ENSURE ENLIVE)  237 mL Oral TID BM  . ferrous sulfate  325 mg Oral Q M,W,F  . gabapentin  100 mg Oral QHS  . metoprolol tartrate  12.5 mg Oral BID  . pantoprazole  40 mg Oral BID AC  . simvastatin  10 mg Oral QHS  . vitamin C  500 mg Oral Daily   Continuous Infusions: . sodium chloride 75 mL/hr at 03/05/17 2004    PRN Meds:.albuterol, alum & mag hydroxide-simeth, hydrALAZINE, lidocaine-prilocaine, ondansetron **OR** ondansetron (ZOFRAN) IV, senna-docusate  Allergies as of 03/04/2017 - Review Complete 03/04/2017  Allergen Reaction Noted  . Percocet [oxycodone-acetaminophen]  02/07/2013  . Septra [sulfamethoxazole-trimethoprim]  02/07/2013  . Tramadol hcl Other (See Comments) 11/14/2012    History reviewed. No pertinent family history.  Social History   Social History  . Marital status: Married    Spouse name: N/A  . Number of children: N/A  . Years of education: N/A   Occupational History  . Not on file.   Social History Main Topics  . Smoking status: Former Smoker    Types: Cigars    Quit date: 10/22/1989  . Smokeless tobacco: Never Used  . Alcohol use No  . Drug use: No  . Sexual activity: Not on file   Other Topics Concern  . Not on file   Social History Narrative  . No narrative on file    Review of Systems: Unable to obtain due to dementia  Physical Exam: Vital signs: Vitals:   03/05/17 1959 03/05/17 2000  BP: (!) 106/48   Pulse: 86 87  Resp: 20   Temp: 97.4 F (36.3 C)    Last BM Date: 03/05/17 (large BM per patient and wife ) General:  Lethargic, demented, elderly, thin, no acute distress HEENT: anicteric sclera, oropharynx clear Neck: supple, nontender Lungs:  Clear throughout to auscultation.   No wheezes, crackles, or rhonchi. No acute distress. Heart:  Regular rate and rhythm; no murmurs, clicks, rubs,  or gallops. Abdomen: soft, nontender, nondistended, +BS  Rectal:  Deferred Ext: no edema  GI:  Lab Results:  Recent Labs  03/04/17 0906  WBC 7.5  HGB 10.4*  HCT 29.4*  PLT 204   BMET  Recent Labs  03/04/17 0906 03/05/17 0915  NA 124* 127*  K 4.5 4.6  CL 95* 98*  CO2 23 23  GLUCOSE 96 86  BUN 26* 20  CREATININE 1.12 1.02  CALCIUM 9.0 9.2   LFT No results for input(s): PROT, ALBUMIN, AST, ALT, ALKPHOS, BILITOT, BILIDIR, IBILI in  the last 72 hours. PT/INR No results for input(s): LABPROT, INR in the last 72 hours.   Studies/Results: Dg Chest 1 View  Result Date: 03/04/2017 CLINICAL DATA:  Chest pain EXAM: CHEST 1 VIEW COMPARISON:  Chest radiograph February 07, 2013 FINDINGS: There is scarring in the left base region. There is no edema or consolidation. Heart is upper normal in size with pulmonary vascularity within normal limits. Patient is status post coronary artery bypass grafting. No adenopathy. No evident bone lesions. IMPRESSION: Scarring left base. No edema or consolidation. Stable cardiac prominence. Electronically Signed   By: Bretta Bang III M.D.   On:  03/04/2017 09:39    Impression/Plan: 83 yo with hyponatremia and failure to thrive in the setting of dementia. Hyponatremia is improving with medical management. PO intake (soft diet) reportedly very good per nursing. Continue PO PPI BID. Would continue supportive care. Would NOT recommend any invasive GI procedures. Manage electrolyte imbalance per primary team. Will sign off. Call us back if questions.    LOS: 1 day   Kmarion Rawl C.  03/05/2017, 8:06 PM

## 2017-03-05 NOTE — Progress Notes (Signed)
[facility] - [place] at [facility]   PATIENT NAME: Bobby [name]    MR#:  [id number]  DATE OF BIRTH:  02/20/1932  SUBJECTIVE:  CHIEF COMPLAINT:   Chief Complaint  Patient presents with  . Chest Pain   The patient is 83 year old Caucasian male with past medical history significant for history of coronary artery disease, gastroesophageal reflux disease, peripheral vascular disease, arthritis, who presents to the hospital with complaints of indigestion, confusion, declining medical status. Heavily. Patient fell down and hurt his leg, now is unable to walk well due to right lower extremity pain. He was noted to have skin pressure changes. Sodium level was found to be low as 124. Patient's family admits of poor patient's oral intake, bloating and discomfort in the chest and epigastric area, thought to be related to underlying coronary artery disease. Patient was initiated on IV fluids and his sodium level improved. Troponin remains normal Urine osmolarity is pending. Review of systems is not reliable   Review of Systems  Unable to perform ROS: Dementia  Cardiovascular: Positive for chest pain.  Musculoskeletal: Positive for back pain, falls and joint pain.    VITAL SIGNS: Blood pressure (!) 145/61, pulse (!) 59, temperature 98.2 F (36.8 C), temperature source Oral, resp. rate 20, height  (1.676 m), weight 67.1 kg (148 lb), SpO2 98 %.  PHYSICAL EXAMINATION:   GENERAL:  83 y.o.-year-old patient lying in the bed with no acute distress, somnolent, but arousable, able to converse some then drifts back to sleep .  EYES: Pupils equal, round, reactive to light and accommodation. No scleral icterus. Extraocular muscles intact.  HEENT: Head atraumatic, normocephalic. Oropharynx and nasopharynx clear.  NECK:  Supple, no jugular venous distention. No thyroid enlargement, no tenderness.  LUNGS: Normal breath sounds bilaterally, no wheezing, rales,rhonchi or  crepitation. No use of accessory muscles of respiration.  CARDIOVASCULAR: S1, S2 normal. No murmurs, rubs, or gallops.  ABDOMEN: Soft, nontender, nondistended. Bowel sounds present. No organomegaly or mass.  EXTREMITIES: No pedal edema, cyanosis, or clubbing.  NEUROLOGIC: Cranial nerves II through XII are intact. Muscle strength 5/5 in all extremities. Sensation intact. Gait not checked.  PSYCHIATRIC: The patient is, somnolent, arousable, able to answer simple questions yes and no, unable to assess his orientation  SKIN: No obvious rash, lesion, or ulcer.   ORDERS/RESULTS REVIEWED:   CBC  Recent Labs Lab 03/04/17 0906  WBC 7.5  HGB 10.4*  HCT 29.4*  PLT 204  MCV 94.9  MCH 33.5  MCHC 35.3  RDW 14.0  LYMPHSABS 0.9*  MONOABS 0.8  EOSABS 0.3  BASOSABS 0.0   ------------------------------------------------------------------------------------------------------------------  Chemistries   Recent Labs Lab 03/04/17 0906 03/05/17 0915  NA 124* 127*  K 4.5 4.6  CL 95* 98*  CO2 23 23  GLUCOSE 96 86  BUN 26* 20  CREATININE 1.12 1.02  CALCIUM 9.0 9.2  MG 1.8  --    ------------------------------------------------------------------------------------------------------------------ estimated creatinine clearance is 50.4 mL/min (by C-G formula based on SCr of 1.02 mg/dL). ------------------------------------------------------------------------------------------------------------------ No results for input(s): TSH, T4TOTAL, T3FREE, THYROIDAB in the last 72 hours.  Invalid input(s): FREET3  Cardiac Enzymes  Recent Labs Lab 03/04/17 0906 03/05/17 0915  TROPONINI <0.03 <0.03   ------------------------------------------------------------------------------------------------------------------ Invalid input(s): POCBNP ---------------------------------------------------------------------------------------------------------------  RADIOLOGY: Dg Chest 1 View  Result Date:  03/04/2017 CLINICAL DATA:  Chest pain EXAM: CHEST 1 VIEW COMPARISON:  Chest radiograph February 07, 2013 FINDINGS: There is scarring in the left base region. There is no edema or  consolidation. Heart is upper normal in size with pulmonary vascularity within normal limits. Patient is status post coronary artery bypass grafting. No adenopathy. No evident bone lesions. IMPRESSION: Scarring left base. No edema or consolidation. Stable cardiac prominence. Electronically Signed   By: Bretta Bang III M.D.   On: 03/04/2017 09:39    EKG:  Orders placed or performed during the hospital encounter of 03/04/17  . EKG 12-Lead  . EKG 12-Lead    ASSESSMENT AND PLAN:  Active Problems:   Hyponatremia  #1. Hyponatremia due to dehydration, improved with IV fluid administration, follow sodium level later today, follow oral intake   #2 failure to thrive adult, etiology is unclear, continue soft diet, Protonix, change it to twice daily, obtain gastroenterologist consultation, if oral intake remains low, could be related to nonsteroidal anti-inflammatory medication use recently for leg pain  #3. Right lower extremity pain, etiology is unclear, MRI of lumbar spine revealed bilateral L4 pars defects with anterolisthesis and severe right greater than left foraminal stenosis, patient may benefit from neurosurgical evaluation versus pain clinic follow-up with Dr. Laban Emperor, initiate EMLA cream when necessary #4. Chest discomfort, patient was seen by cardiologist, no recommendations for cardiac evaluation in patient. Continue current therapy with aspirin, Lovenox, Lopressor, Zocor, cardiac enzymes were negative  Management plans discussed with the patient, family and they are in agreement.   DRUG ALLERGIES:  Allergies  Allergen Reactions  . Percocet [Oxycodone-Acetaminophen]     Hallucinations   . Septra [Sulfamethoxazole-Trimethoprim]   . Tramadol Hcl Other (See Comments)    Hallucinations    CODE STATUS:       Code Status Orders        Start     Ordered   03/04/17 1227  Do not attempt resuscitation (DNR)  Continuous    Question Answer Comment  In the event of cardiac or respiratory ARREST Do not call a "code blue"   In the event of cardiac or respiratory ARREST Do not perform Intubation, CPR, defibrillation or ACLS   In the event of cardiac or respiratory ARREST Use medication by any route, position, wound care, and other measures to relive pain and suffering. May use oxygen, suction and manual treatment of airway obstruction as needed for comfort.      03/04/17 1226    Code Status History    Date Active Date Inactive Code Status Order ID Comments User Context   12/06/2016  4:21 PM 12/09/2016  5:45 PM Full Code [id number]  Auburn Bilberry, MD Inpatient   11/14/2012  5:37 PM 11/16/2012  1:56 PM Full Code [id number]  Ardelle Balls, PA Inpatient    Advance Directive Documentation     Most Recent Value  Type of Advance Directive  Healthcare Power of Attorney, Living will  Pre-existing out of facility DNR order (yellow form or pink MOST form)  -  "MOST" Form in Place?  -      TOTAL TIME TAKING CARE OF THIS PATIENT: 40 minutes.   Discussed with patient's wife extensively  KUTE,Mahek Schlesinger M.D on 03/05/2017 at 12:47 PM  Between 7am to 6pm - Pager - [phone]  After 6pm go to [email or web address] - password [email or web address] [facility]  [facility]  Office  [phone]  CC: Primary care physician; Danella Penton, MD

## 2017-03-05 NOTE — Progress Notes (Signed)
Pt's daughter-in-law told me that he had complained of burning with urination while in the ED. I do not see that a urinalysis has been collected. I called Dr. Winona Legato and asked if she would like me to collect a sample and she said to collect a urinalysis.

## 2017-03-05 NOTE — Progress Notes (Addendum)
Dr. Winona Legato ordered neurosurgery and cardiology consult. Cardiology consulted called in to Dr. Juliann Pares, but there is no neurosurgery coverage this weekend. Dr. Winona Legato updated via text-page system.  Update 1350: I spoke to Dr. Winona Legato over the phone, and gave update on pt's status. No new orders received.

## 2017-03-05 NOTE — Progress Notes (Addendum)
Pt's HR is 59 this AM. Dose of PO 12.5mg  of metoprolol held due to low HR. Dr. Winona Legato notified via text-page system. No new orders received.   Update 1350: I spoke to Dr. Winona Legato over the phone, and gave update on pt's status. No new orders received.

## 2017-03-05 NOTE — Evaluation (Signed)
Physical Therapy Evaluation Patient Details Name: Bobby [name] MRN: [id number] DOB: [birth year] Today's Date: 03/05/2017   History of Present Illness  Pt is a 83 y/o M who was sent to the ED due to indigestion.  Wife and daughter report pt has been confused and weaker than usual.  Pt admitted with hyponatremia and dehydration with acute metabolic encephalopathy. Pt's PMH includes R TKA, NSTEMI, CABG.    Clinical Impression  Pt admitted with above diagnosis. Pt currently with functional limitations due to the deficits listed below (see PT Problem List). Bobby [name] presents with generalized weakness and is quick to fatigue.  He requires cues for safe technique using RW with transfers and short distance ambulation in room (8 ft).  Given his current mobility status, recommending SNF at d/c.  Pt will benefit from skilled PT to increase their independence and safety with mobility to allow discharge to the venue listed below.      Follow Up Recommendations SNF    Equipment Recommendations  None recommended by PT    Recommendations for Other Services       Precautions / Restrictions Precautions Precautions: Fall Restrictions Weight Bearing Restrictions: No      Mobility  Bed Mobility Overal bed mobility: Needs Assistance Bed Mobility: Supine to Sit     Supine to sit: Min assist;HOB elevated     General bed mobility comments: Increased effort and time and pt relies heavily on bed rail with HOB elevated.  Assist needed to elevate trunk.  Transfers Overall transfer level: Needs assistance Equipment used: Rolling walker (2 wheeled) Transfers: Sit to/from Stand Sit to Stand: Min guard         General transfer comment: Close min guard assist as pt very slow to stand and requires multiple attempts to stand from EOB.  Cues for hand placement with sit<>stand as pt initially placing both hands on RW.  Ambulation/Gait Ambulation/Gait assistance: Min guard Ambulation Distance (Feet):  8 Feet Assistive device: Rolling walker (2 wheeled) Gait Pattern/deviations: Step-to pattern;Decreased stride length;Decreased weight shift to right;Trunk flexed Gait velocity: decreased Gait velocity interpretation: Below normal speed for age/gender General Gait Details: Flexed posture which improves only momentarily with verbal cues for upright posture.  He reports RLE pain when ambulating and fatigues quickly, requesting to sit.    Stairs            Wheelchair Mobility    Modified Rankin (Stroke Patients Only)       Balance Overall balance assessment: Needs assistance;History of Falls Sitting-balance support: No upper extremity supported;Feet supported Sitting balance-Leahy Scale: Good     Standing balance support: Bilateral upper extremity supported;During functional activity Standing balance-Leahy Scale: Poor Standing balance comment: Relies on UE support for static and dynamic activities                             Pertinent Vitals/Pain Pain Assessment: 0-10 Pain Score: 3  Pain Location: R knee region when ambulating (Reports this is chronic since vascular surgery in 2013) Pain Descriptors / Indicators: Aching;Discomfort Pain Intervention(s): Limited activity within patient's tolerance;Monitored during session;Repositioned    Home Living Family/patient expects to be discharged to:: Skilled nursing facility Living Arrangements: Spouse/significant other Available Help at Discharge: Family;Available 24 hours/day Type of Home: House Home Access: Stairs to enter Entrance Stairs-Rails: Lawyer of Steps: 4 Home Layout: One level Home Equipment: Environmental consultant - 2 wheels;Cane - single point;Walker - 4 wheels;Shower seat;Grab bars - tub/shower  Prior Function Level of Independence: Needs assistance   Gait / Transfers Assistance Needed: Pt ambulating limited household distances only due to fatigue.  Typically uses cane in home and RW  outside of home.  Reports 3 falls posteriorly over the past 6 months.  ADL's / Homemaking Assistance Needed: Pt requires assist for bathing (sponge bath as pt unable to transfer into tub/shower unit), dressing.  Wife does the cooking, cleaning, driving.        Hand Dominance        Extremity/Trunk Assessment   Upper Extremity Assessment Upper Extremity Assessment: Generalized weakness (Strength grossly 3/5 BUE)    Lower Extremity Assessment Lower Extremity Assessment:  (Strength grossly 3/5 BLE)    Cervical / Trunk Assessment Cervical / Trunk Assessment: Kyphotic  Communication   Communication: No difficulties  Cognition Arousal/Alertness: Awake/alert Behavior During Therapy: WFL for tasks assessed/performed Overall Cognitive Status: Within Functional Limits for tasks assessed                                        General Comments General comments (skin integrity, edema, etc.): Pt's wife present during evaluation    Exercises     Assessment/Plan    PT Assessment Patient needs continued PT services  PT Problem List Decreased strength;Decreased activity tolerance;Decreased balance;Decreased mobility;Decreased knowledge of use of DME;Decreased safety awareness;Pain       PT Treatment Interventions DME instruction;Gait training;Functional mobility training;Therapeutic activities;Stair training;Therapeutic exercise;Balance training;Neuromuscular re-education;Modalities;Patient/family education;Wheelchair mobility training    PT Goals (Current goals can be found in the Care Plan section)  Acute Rehab PT Goals Patient Stated Goal: rehab before home PT Goal Formulation: With patient/family Time For Goal Achievement: 03/19/17 Potential to Achieve Goals: Good    Frequency Min 2X/week   Barriers to discharge        Co-evaluation               End of Session Equipment Utilized During Treatment: Gait belt Activity Tolerance: Patient tolerated  treatment well;Patient limited by fatigue Patient left: in chair;with call bell/phone within reach;with chair alarm set;with family/visitor present Nurse Communication: Mobility status PT Visit Diagnosis: History of falling (Z91.81);Muscle weakness (generalized) (M62.81)    Time: 9604-5409 PT Time Calculation (min) (ACUTE ONLY): 19 min   Charges:   PT Evaluation $PT Eval Low Complexity: 1 Procedure     PT G Codes:        Encarnacion Chu PT, DPT 03/05/2017, 10:16 AM

## 2017-03-06 ENCOUNTER — Encounter: Payer: Self-pay | Admitting: Radiology

## 2017-03-06 ENCOUNTER — Inpatient Hospital Stay: Payer: Medicare HMO

## 2017-03-06 LAB — URINALYSIS, COMPLETE (UACMP) WITH MICROSCOPIC
BILIRUBIN URINE: NEGATIVE
Glucose, UA: NEGATIVE mg/dL
HGB URINE DIPSTICK: NEGATIVE
KETONES UR: NEGATIVE mg/dL
LEUKOCYTES UA: NEGATIVE
NITRITE: NEGATIVE
PH: 7 (ref 5.0–8.0)
Protein, ur: NEGATIVE mg/dL
SPECIFIC GRAVITY, URINE: 1.01 (ref 1.005–1.030)
Squamous Epithelial / LPF: NONE SEEN

## 2017-03-06 LAB — BASIC METABOLIC PANEL
ANION GAP: 8 (ref 5–15)
BUN: 23 mg/dL — ABNORMAL HIGH (ref 6–20)
CALCIUM: 8.5 mg/dL — AB (ref 8.9–10.3)
CO2: 19 mmol/L — AB (ref 22–32)
Chloride: 99 mmol/L — ABNORMAL LOW (ref 101–111)
Creatinine, Ser: 0.98 mg/dL (ref 0.61–1.24)
GLUCOSE: 84 mg/dL (ref 65–99)
POTASSIUM: 4.7 mmol/L (ref 3.5–5.1)
Sodium: 126 mmol/L — ABNORMAL LOW (ref 135–145)

## 2017-03-06 LAB — HEMOGLOBIN: Hemoglobin: 9.9 g/dL — ABNORMAL LOW (ref 13.0–18.0)

## 2017-03-06 MED ORDER — IOPAMIDOL (ISOVUE-300) INJECTION 61%
15.0000 mL | INTRAVENOUS | Status: AC
  Administered 2017-03-06 (×2): 15 mL via ORAL

## 2017-03-06 MED ORDER — IOPAMIDOL (ISOVUE-300) INJECTION 61%
100.0000 mL | Freq: Once | INTRAVENOUS | Status: AC | PRN
  Administered 2017-03-06: 100 mL via INTRAVENOUS

## 2017-03-06 NOTE — Progress Notes (Signed)
Gastroenterology Progress Note    Bobby [name] 83 y.o. [birth year]   Subjective: More alert. Denies abdominal pain. Grandchildren in room. Reportedly has lost almost 40 pounds in the past year.  Objective: Vital signs in last 24 hours: Vitals:   03/06/17 0752 03/06/17 0803  BP:  (!) 149/58  Pulse:  64  Resp: 20   Temp:  98 F (36.7 C)    Physical Exam: Gen: more alert, no acute distress, elderly, thin CV: RRR Chest: CTA B Abd: soft, nontender, nondistended, +BS Ext: no edema  Lab Results:  Recent Labs  03/04/17 0906 03/05/17 0915 03/06/17 0655  NA 124* 127* 126*  K 4.5 4.6 4.7  CL 95* 98* 99*  CO2 23 23 19*  GLUCOSE 96 86 84  BUN 26* 20 23*  CREATININE 1.12 1.02 0.98  CALCIUM 9.0 9.2 8.5*  MG 1.8  --   --    No results for input(s): AST, ALT, ALKPHOS, BILITOT, PROT, ALBUMIN in the last 72 hours.  Recent Labs  03/04/17 0906 03/06/17 0655  WBC 7.5  --   NEUTROABS 5.5  --   HGB 10.4* 9.9*  HCT 29.4*  --   MCV 94.9  --   PLT 204  --    No results for input(s): LABPROT, INR in the last 72 hours.    Assessment/Plan: 83 yo with hyponatremia, failure to thrive, and dementia. Reportedly has had a 36 pound weight loss in the past year. Source of weight loss likely multifactorial but will do a CT scan to look for any GI malignancies. Will do an abd/pelvis CT with contrast to further evaluate. Dr. Tobi Bastos or Dr. Servando Snare will f/u tomorrow.   Jah Alarid C. 03/06/2017, 11:39 AM

## 2017-03-06 NOTE — Progress Notes (Signed)
Calorie Count Instructions  Please hang calorie count envelope on the patient's door. Document percent consumed for each item on the patient's meal tray ticket and keep in envelope. Also document percent of any supplement or snack pt consumes and keep documentation in envelope for RD to review.   

## 2017-03-06 NOTE — Progress Notes (Signed)
[facility] - [place] at [facility]   PATIENT NAME: Bobby [name]    MR#:  [id number]  DATE OF BIRTH:  06-05-1932  SUBJECTIVE:  CHIEF COMPLAINT:   Chief Complaint  Patient presents with  . Chest Pain   The patient is 83 year old Caucasian male with past medical history significant for history of coronary artery disease, gastroesophageal reflux disease, peripheral vascular disease, arthritis, who presents to the hospital with complaints of indigestion, confusion, declining medical status. Heavily. Patient fell down and hurt his leg, now is unable to walk well due to right lower extremity pain. He was noted to have skin pressure changes. Sodium level was found to be low as 124. Patient's family admits of poor patient's oral intake, bloating and discomfort in the chest and epigastric area, thought to be related to underlying coronary artery disease. Patient was initiated on IV fluids and his sodium level improved. Troponin remains normal Urine osmolarity is Higher than serum. Review of systems is not reliable Patient feels comfortable today, remains somewhat somnolent. Sodium level is lower than yesterday. Patient's family reports about 36 pound weight loss over the past one year. Patient was seen by gastroenterologist and recommended CT of abdomen and pelvis to look for any malignancy, follow up with gastroenterologist tomorrow.  Review of Systems  Unable to perform ROS: Dementia  Cardiovascular: Positive for chest pain.  Musculoskeletal: Positive for back pain, falls and joint pain.    VITAL SIGNS: Blood pressure (!) 145/61, pulse (!) 59, temperature 98.2 F (36.8 C), temperature source Oral, resp. rate 20, height  (1.676 m), weight 67.1 kg (148 lb), SpO2 98 %.  PHYSICAL EXAMINATION:   GENERAL:  83 y.o.-year-old patient lying in the bed with no acute distress, somnolent, but arousable, confused, is not sure where he is.  EYES: Pupils equal, round,  reactive to light and accommodation. No scleral icterus. Extraocular muscles intact.  HEENT: Head atraumatic, normocephalic. Oropharynx and nasopharynx clear.  NECK:  Supple, no jugular venous distention. No thyroid enlargement, no tenderness.  LUNGS: Normal breath sounds bilaterally, no wheezing, rales,rhonchi or crepitation. No use of accessory muscles of respiration.  CARDIOVASCULAR: S1, S2 normal. No murmurs, rubs, or gallops.  ABDOMEN: Soft, nontender, nondistended. Bowel sounds present. No organomegaly or mass.  EXTREMITIES: No pedal edema, cyanosis, or clubbing.  NEUROLOGIC: Cranial nerves II through XII are intact. Muscle strength 5/5 in all extremities. Sensation intact. Gait not checked.  PSYCHIATRIC: The patient is, somnolent, arousable, able to answer simple questions yes and no, unable to assess his orientation  SKIN: No obvious rash, lesion, or ulcer.   ORDERS/RESULTS REVIEWED:   CBC  Recent Labs Lab 03/04/17 0906  WBC 7.5  HGB 10.4*  HCT 29.4*  PLT 204  MCV 94.9  MCH 33.5  MCHC 35.3  RDW 14.0  LYMPHSABS 0.9*  MONOABS 0.8  EOSABS 0.3  BASOSABS 0.0   ------------------------------------------------------------------------------------------------------------------  Chemistries   Recent Labs Lab 03/04/17 0906 03/05/17 0915  NA 124* 127*  K 4.5 4.6  CL 95* 98*  CO2 23 23  GLUCOSE 96 86  BUN 26* 20  CREATININE 1.12 1.02  CALCIUM 9.0 9.2  MG 1.8  --    ------------------------------------------------------------------------------------------------------------------ estimated creatinine clearance is 50.4 mL/min (by C-G formula based on SCr of 1.02 mg/dL). ------------------------------------------------------------------------------------------------------------------ No results for input(s): TSH, T4TOTAL, T3FREE, THYROIDAB in the last 72 hours.  Invalid input(s): FREET3  Cardiac Enzymes  Recent Labs Lab 03/04/17 0906 03/05/17 0915  TROPONINI <0.03  <0.03   ------------------------------------------------------------------------------------------------------------------  Invalid input(s): POCBNP ---------------------------------------------------------------------------------------------------------------  RADIOLOGY: Dg Chest 1 View  Result Date: 03/04/2017 CLINICAL DATA:  Chest pain EXAM: CHEST 1 VIEW COMPARISON:  Chest radiograph February 07, 2013 FINDINGS: There is scarring in the left base region. There is no edema or consolidation. Heart is upper normal in size with pulmonary vascularity within normal limits. Patient is status post coronary artery bypass grafting. No adenopathy. No evident bone lesions. IMPRESSION: Scarring left base. No edema or consolidation. Stable cardiac prominence. Electronically Signed   By: Bretta Bang III M.D.   On: 03/04/2017 09:39    EKG:  Orders placed or performed during the hospital encounter of 03/04/17  . EKG 12-Lead  . EKG 12-Lead    ASSESSMENT AND PLAN:  Active Problems:   Hyponatremia  #1. Hyponatremia due to dehydration, improved with IV fluid administration,But now worsened again, patient's urine osmolarity is high density room, suspect SIADH, discontinue IV fluids, follow sodium level in the morning, follow oral intake   #2 failure to thrive adult, etiology is unclear, continue soft diet, Protonix twice daily, appreciate gastroenterologist input, getting CT scan of abdomen and pelvis to rule out malignancy, gastroenterologist follow-up tomorrow. Dietitian consultation is requested for calorie count, to be initiated today #3. Right lower extremity pain, suspected radicular, MRI of lumbar spine revealed bilateral L4 pars defects with anterolisthesis and severe right greater than left foraminal stenosis, awaiting for neurosurgical input, continue EMLA cream when necessary, some improved today, patient was seen by physical therapist and recommended skilled nursing facility placement.  #4. Chest  discomfort, patient was seen by cardiologist, no recommendations for cardiac evaluation at present. Continue current therapy with aspirin, Lovenox, Lopressor, Zocor, cardiac enzymes were negative  #5. Encephalopathy of unclear etiology at this time, getting a brain MRI to rule out recent stroke, may need to discuss with neurologist  Management plans discussed with the patient, family and they are in agreement.   DRUG ALLERGIES:  Allergies  Allergen Reactions  . Percocet [Oxycodone-Acetaminophen]     Hallucinations   . Septra [Sulfamethoxazole-Trimethoprim]   . Tramadol Hcl Other (See Comments)    Hallucinations    CODE STATUS:     Code Status Orders        Start     Ordered   03/04/17 1227  Do not attempt resuscitation (DNR)  Continuous    Question Answer Comment  In the event of cardiac or respiratory ARREST Do not call a "code blue"   In the event of cardiac or respiratory ARREST Do not perform Intubation, CPR, defibrillation or ACLS   In the event of cardiac or respiratory ARREST Use medication by any route, position, wound care, and other measures to relive pain and suffering. May use oxygen, suction and manual treatment of airway obstruction as needed for comfort.      03/04/17 1226    Code Status History    Date Active Date Inactive Code Status Order ID Comments User Context   12/06/2016  4:21 PM 12/09/2016  5:45 PM Full Code [id number]  Auburn Bilberry, MD Inpatient   11/14/2012  5:37 PM 11/16/2012  1:56 PM Full Code [id number]  Ardelle Balls, PA Inpatient    Advance Directive Documentation     Most Recent Value  Type of Advance Directive  Healthcare Power of Attorney, Living will  Pre-existing out of facility DNR order (yellow form or pink MOST form)  -  "MOST" Form in Place?  -      TOTAL TIME TAKING CARE OF THIS  PATIENT: 40 minutes.   Discussed with patient's Multiple family members extensively, all questions were answered  KUTE,Jadriel Saxer M.D on 03/05/2017 at  12:47 PM  Between 7am to 6pm - Pager - [phone]  After 6pm go to [email or web address] - password [email or web address] [facility]  [place]  [facility]  Office  [phone]  CC: Primary care physician; Danella Penton, MD

## 2017-03-06 NOTE — Clinical Social Work Note (Signed)
CSW received a consult for placement. CSW has already assessed and begun the referral process as noted in the chart.  Argentina Ponder, MSW, Theresia Majors [phone]

## 2017-03-07 ENCOUNTER — Encounter: Payer: Self-pay | Admitting: *Deleted

## 2017-03-07 ENCOUNTER — Inpatient Hospital Stay: Payer: Medicare HMO | Admitting: Certified Registered Nurse Anesthetist

## 2017-03-07 ENCOUNTER — Encounter
Admission: EM | Disposition: A | Discharge status: Skilled Nursing Facility | Payer: Self-pay | Source: Home / Self Care | Attending: Internal Medicine

## 2017-03-07 DIAGNOSIS — K269 Duodenal ulcer, unspecified as acute or chronic, without hemorrhage or perforation: Secondary | ICD-10-CM

## 2017-03-07 DIAGNOSIS — K2980 Duodenitis without bleeding: Secondary | ICD-10-CM

## 2017-03-07 DIAGNOSIS — R634 Abnormal weight loss: Secondary | ICD-10-CM

## 2017-03-07 DIAGNOSIS — R933 Abnormal findings on diagnostic imaging of other parts of digestive tract: Secondary | ICD-10-CM

## 2017-03-07 HISTORY — PX: ESOPHAGOGASTRODUODENOSCOPY (EGD) WITH PROPOFOL: SHX5813

## 2017-03-07 LAB — SODIUM: Sodium: 125 mmol/L — ABNORMAL LOW (ref 135–145)

## 2017-03-07 LAB — CO2, TOTAL: CO2: 23 mmol/L (ref 22–32)

## 2017-03-07 SURGERY — ESOPHAGOGASTRODUODENOSCOPY (EGD) WITH PROPOFOL
Anesthesia: General

## 2017-03-07 MED ORDER — MIDAZOLAM HCL 2 MG/2ML IJ SOLN
INTRAMUSCULAR | Status: AC
  Filled 2017-03-07: qty 2

## 2017-03-07 MED ORDER — SODIUM CHLORIDE 0.9 % IV SOLN
INTRAVENOUS | Status: DC | PRN
  Administered 2017-03-07: 11:00:00 via INTRAVENOUS

## 2017-03-07 MED ORDER — FENTANYL CITRATE (PF) 100 MCG/2ML IJ SOLN
INTRAMUSCULAR | Status: AC
  Filled 2017-03-07: qty 2

## 2017-03-07 MED ORDER — FENTANYL CITRATE (PF) 100 MCG/2ML IJ SOLN
INTRAMUSCULAR | Status: DC | PRN
  Administered 2017-03-07: 50 ug via INTRAVENOUS

## 2017-03-07 MED ORDER — SODIUM CHLORIDE 0.9 % IV SOLN
INTRAVENOUS | Status: DC
  Administered 2017-03-07: 10:00:00 via INTRAVENOUS

## 2017-03-07 MED ORDER — MIDAZOLAM HCL 2 MG/2ML IJ SOLN
INTRAMUSCULAR | Status: DC | PRN
  Administered 2017-03-07: 1 mg via INTRAVENOUS

## 2017-03-07 MED ORDER — PROPOFOL 500 MG/50ML IV EMUL
INTRAVENOUS | Status: AC
  Filled 2017-03-07: qty 50

## 2017-03-07 MED ORDER — PROPOFOL 10 MG/ML IV BOLUS
INTRAVENOUS | Status: AC
  Filled 2017-03-07: qty 20

## 2017-03-07 MED ORDER — PROPOFOL 500 MG/50ML IV EMUL
INTRAVENOUS | Status: DC | PRN
  Administered 2017-03-07: 100 ug/kg/min via INTRAVENOUS

## 2017-03-07 MED ORDER — LIDOCAINE HCL (PF) 2 % IJ SOLN
INTRAMUSCULAR | Status: AC
  Filled 2017-03-07: qty 2

## 2017-03-07 NOTE — Op Note (Addendum)
[facility] Gastroenterology Patient Name: Bobby [name] Procedure Date: 03/07/2017 11:00 AM MRN: [id number] Account #: [id number] Date of Birth: 03/13/1932 Admit Type: Inpatient Age: 83 Room: [facility] ENDO ROOM 4 Gender: Male Note Status: Finalized Procedure:            Upper GI endoscopy Indications:          Abnormal CT of the GI tract Providers:            Wyline Mood MD, MD Medicines:            Monitored Anesthesia Care Complications:        No immediate complications. Procedure:            Pre-Anesthesia Assessment:                       - Prior to the procedure, a History and Physical was                        performed, and patient medications, allergies and                        sensitivities were reviewed. The patient's tolerance of                        previous anesthesia was reviewed.                       - The risks and benefits of the procedure and the                        sedation options and risks were discussed with the                        patient. All questions were answered and informed                        consent was obtained.                       - ASA Grade Assessment: III - A patient with severe                        systemic disease.                       After obtaining informed consent, the endoscope was                        passed under direct vision. Throughout the procedure,                        the patient's blood pressure, pulse, and oxygen                        saturations were monitored continuously. The Endoscope                        was introduced through the mouth, and advanced to the                        third part of duodenum. The upper GI endoscopy  was                        accomplished with ease. The patient tolerated the                        procedure well. Findings:      The esophagus was normal.      The stomach was normal.      One non-bleeding cratered duodenal ulcer with no stigmata of  bleeding       was found in the duodenal bulb. The lesion was 7 mm in largest       dimension. Biopsies were taken with a cold forceps for histology.       Biopsies taken from edge of ulcer      Localized inflammation characterized by congestion (edema) and erythema       was found in the duodenal bulb and in the second portion of the       duodenum. Biopsies were taken with a cold forceps for histology.      One non-bleeding cratered duodenal ulcer with no stigmata of bleeding       was found in the second portion of the duodenum. The lesion was 10 mm in       largest dimension. Impression:           - Normal esophagus.                       - Normal stomach.                       - One non-bleeding duodenal ulcer with no stigmata of                        bleeding. Biopsied.                       - Duodenitis. Biopsied.                       - One non-bleeding duodenal ulcer with no stigmata of                        bleeding. Recommendation:       - Return patient to hospital ward for ongoing care.                       - Advance diet as tolerated.                       - Continue present medications.                       - Await pathology results.                       - Repeat upper endoscopy in 6 weeks to evaluate the                        response to therapy.                       - Return to GI office in 2 weeks.                       -  Use Prilosec (omeprazole) 40 mg PO daily for 10 weeks.                       - No ibuprofen, naproxen, or other non-steroidal                        anti-inflammatory drugs for 12 weeks.                       - Perform an H. pylori stool antigen (HpSA) test today.                       - Please pursue above recommendations. I will sign out                        please call with questions. Procedure Code(s):    --- Professional ---                       [phone], Esophagogastroduodenoscopy, flexible, transoral;                        with  biopsy, single or multiple Diagnosis Code(s):    --- Professional ---                       K26.9, Duodenal ulcer, unspecified as acute or chronic,                        without hemorrhage or perforation                       K29.80, Duodenitis without bleeding                       R93.3, Abnormal findings on diagnostic imaging of other                        parts of digestive tract CPT copyright 2016 American Medical Association. All rights reserved. The codes documented in this report are preliminary and upon coder review may  be revised to meet current compliance requirements. Wyline Mood, MD Wyline Mood MD, MD 03/07/2017 11:14:37 AM This report has been signed electronically. Number of Addenda: 0 Note Initiated On: 03/07/2017 11:00 AM      [facility]

## 2017-03-07 NOTE — Clinical Social Work Note (Signed)
CSW provided bed offers and family chose [facility]. CSW notified facility, and they have started [facility]. CSW will continue to follow.   Dede Query, MSW, LCSW  Clinical Social Worker  [phone]

## 2017-03-07 NOTE — Progress Notes (Signed)
See procedure note for recommendations  Dr Wyline Mood  Gastroenterology/Hepatology Pager: [phone]

## 2017-03-07 NOTE — Plan of Care (Signed)
Problem: Pain Managment: Goal: General experience of comfort will improve Outcome: Progressing Patient's pain currently well controlled on current regimen

## 2017-03-07 NOTE — Progress Notes (Signed)
[facility] - [place] at [place] [facility]   PATIENT NAME: Bobby [name]    MR#:  [id number]  DATE OF BIRTH:  12/31/1931  SUBJECTIVE:  CHIEF COMPLAINT:   Chief Complaint  Patient presents with  . Chest Pain   The patient is 83 year old Caucasian male with past medical history significant for history of coronary artery disease, gastroesophageal reflux disease, peripheral vascular disease, arthritis, who presents to the hospital with complaints of indigestion, confusion, declining medical status. Heavily. Patient fell down and hurt his leg, now is unable to walk well due to right lower extremity pain. He was noted to have skin pressure changes. Sodium level was found to be low as 124. Patient's family admits of poor patient's oral intake, bloating and discomfort in the chest and epigastric area, thought to be related to underlying coronary artery disease. Patient was initiated on IV fluids and his sodium level improved. Troponin remains normal Urine osmolarity is Higher than serum. Review of systems is not reliable Patient feels comfortable today, remains somewhat somnolent. Sodium level is lower than yesterday. Patient's family reports about 36 pound weight loss over the past one year. Patient was seen by gastroenterologist and recommended CT of abdomen and pelvis to look for any malignancy, follow up with gastroenterologist . CT of abdomen and pelvis revealed thickening and duodenal area concerning for ulcer versus malignancy, EGD was recommended, patient underwent EGD today by Dr. Tobi Bastos, revealing duodenal ulcer, advancement of diet was recommended and Protonix 40 a day for 10 weeks, follow-up with gas and a as outpatient. Patient is comfortable today, however, his sodium level is lower than it was yesterday, despite discontinuation off IV fluids.  Review of Systems  Unable to perform ROS: Dementia  Cardiovascular: Positive for chest pain.  Musculoskeletal: Positive for  back pain, falls and joint pain.    VITAL SIGNS: Blood pressure (!) 145/61, pulse (!) 59, temperature 98.2 F (36.8 C), temperature source Oral, resp. rate 20, height  (1.676 m), weight 67.1 kg (148 lb), SpO2 98 %.  PHYSICAL EXAMINATION:   GENERAL:  83 y.o.-year-old patient lying in the bed with no acute distress, alert and intermittently confused EYES: Pupils equal, round, reactive to light and accommodation. No scleral icterus. Extraocular muscles intact.  HEENT: Head atraumatic, normocephalic. Oropharynx and nasopharynx clear.  NECK:  Supple, no jugular venous distention. No thyroid enlargement, no tenderness.  LUNGS: Normal breath sounds bilaterally, no wheezing, rales,rhonchi or crepitation. No use of accessory muscles of respiration.  CARDIOVASCULAR: S1, S2 normal. No murmurs, rubs, or gallops.  ABDOMEN: Soft, nontender, nondistended. Bowel sounds present. No organomegaly or mass.  EXTREMITIES: No pedal edema, cyanosis, or clubbing.  NEUROLOGIC: Cranial nerves II through XII are intact. Muscle strength 5/5 in all extremities. Sensation intact. Gait not checked.  PSYCHIATRIC: The patient is, somnolent, arousable, able to answer simple questions yes and no, unable to assess his orientation  SKIN: No obvious rash, lesion, or ulcer.   ORDERS/RESULTS REVIEWED:   CBC  Recent Labs Lab 03/04/17 0906  WBC 7.5  HGB 10.4*  HCT 29.4*  PLT 204  MCV 94.9  MCH 33.5  MCHC 35.3  RDW 14.0  LYMPHSABS 0.9*  MONOABS 0.8  EOSABS 0.3  BASOSABS 0.0   ------------------------------------------------------------------------------------------------------------------  Chemistries   Recent Labs Lab 03/04/17 0906 03/05/17 0915  NA 124* 127*  K 4.5 4.6  CL 95* 98*  CO2 23 23  GLUCOSE 96 86  BUN 26* 20  CREATININE 1.12 1.02  CALCIUM 9.0  9.2  MG 1.8  --    ------------------------------------------------------------------------------------------------------------------ estimated  creatinine clearance is 50.4 mL/min (by C-G formula based on SCr of 1.02 mg/dL). ------------------------------------------------------------------------------------------------------------------ No results for input(s): TSH, T4TOTAL, T3FREE, THYROIDAB in the last 72 hours.  Invalid input(s): FREET3  Cardiac Enzymes  Recent Labs Lab 03/04/17 0906 03/05/17 0915  TROPONINI <0.03 <0.03   ------------------------------------------------------------------------------------------------------------------ Invalid input(s): POCBNP ---------------------------------------------------------------------------------------------------------------  RADIOLOGY: Dg Chest 1 View  Result Date: 03/04/2017 CLINICAL DATA:  Chest pain EXAM: CHEST 1 VIEW COMPARISON:  Chest radiograph February 07, 2013 FINDINGS: There is scarring in the left base region. There is no edema or consolidation. Heart is upper normal in size with pulmonary vascularity within normal limits. Patient is status post coronary artery bypass grafting. No adenopathy. No evident bone lesions. IMPRESSION: Scarring left base. No edema or consolidation. Stable cardiac prominence. Electronically Signed   By: Bretta Bang III M.D.   On: 03/04/2017 09:39    EKG:  Orders placed or performed during the hospital encounter of 03/04/17  . EKG 12-Lead  . EKG 12-Lead    ASSESSMENT AND PLAN:  Active Problems:   Hyponatremia  #1. Hyponatremia due to dehydration, improved with IV fluid administration, but then worsened again, patient's urine osmolarity was higher than room, suspect SIADH, however, worsened even after discontinuation of IV fluids yesterday, initiate fluid restriction,  follow sodium level in the morning, follow oral intake.  #2 failure to thrive adult, likely etiology was duodenal ulcer, continue soft diet, Protonix twice daily, appreciate gastroenterologist input, CT scan of abdomen and pelvis showed no malignancy, status post EGD,  revealing duodenal ulcer, nonbleeding, gastroenterologist follow-up as outpatient was recommended. Continue calorie count.  #3. Right lower extremity pain, suspected radicular, MRI of lumbar spine revealed bilateral L4 pars defects with anterolisthesis and severe right greater than left foraminal stenosis, patient has an appointment with neurosurgery within next few days, continue EMLA cream when necessary, improved overall, patient was seen by physical therapist and recommended skilled nursing facility placement. Follow-up with neurosurgery as outpatient.  #4. Chest discomfort, patient was seen by cardiologist, no recommendations for cardiac evaluation at present. Continue current therapy with aspirin, Lovenox, Lopressor, Zocor, cardiac enzymes were negative  #5. Encephalopathy of unclear etiology at this time, suspected due to dementia, brain MRI revealed no recent stroke. The Neurologist involved for further recommendations.   Management plans discussed with the patient, family and they are in agreement.   DRUG ALLERGIES:  Allergies  Allergen Reactions  . Percocet [Oxycodone-Acetaminophen]     Hallucinations   . Septra [Sulfamethoxazole-Trimethoprim]   . Tramadol Hcl Other (See Comments)    Hallucinations    CODE STATUS:     Code Status Orders        Start     Ordered   03/04/17 1227  Do not attempt resuscitation (DNR)  Continuous    Question Answer Comment  In the event of cardiac or respiratory ARREST Do not call a "code blue"   In the event of cardiac or respiratory ARREST Do not perform Intubation, CPR, defibrillation or ACLS   In the event of cardiac or respiratory ARREST Use medication by any route, position, wound care, and other measures to relive pain and suffering. May use oxygen, suction and manual treatment of airway obstruction as needed for comfort.      03/04/17 1226    Code Status History    Date Active Date Inactive Code Status Order ID Comments User Context    12/06/2016  4:21 PM 12/09/2016  5:45 PM Full Code [id number]  Auburn Bilberry, MD Inpatient   11/14/2012  5:37 PM 11/16/2012  1:56 PM Full Code [id number]  Ardelle Balls, PA Inpatient    Advance Directive Documentation     Most Recent Value  Type of Advance Directive  Healthcare Power of Attorney, Living will  Pre-existing out of facility DNR order (yellow form or pink MOST form)  -  "MOST" Form in Place?  -      TOTAL TIME TAKING CARE OF THIS PATIENT: 35 minutes.   Discussed with patient's Multiple family members extensively, all questions were answered  KUTE,Lakyia Behe M.D on 03/05/2017 at 12:47 PM  Between 7am to 6pm - Pager - [phone]  After 6pm go to [email or web address] - password [email or web address] [facility]  [place] [facility]  Office  [phone]  CC: Primary care physician; Danella Penton, MD

## 2017-03-07 NOTE — Plan of Care (Signed)
Problem: Fluid Volume: Goal: Ability to maintain a balanced intake and output will improve Outcome: Not Progressing Pt continues with poor appetite, calorie count in progress. Continue to offer patient choices.

## 2017-03-07 NOTE — Progress Notes (Signed)
Wyline Mood MD [address], [address] [place], [place] [address] Phone: [phone] Fax : [phone]  Bobby [name] is being followed for unintentional weight loss  Day 2 of follow up   Subjective: Feels weak. No vomiting    Objective: Vital signs in last 24 hours: Vitals:   03/06/17 0803 03/06/17 1336 03/06/17 2036 03/07/17 0439  BP: (!) 149/58 136/81 (!) 131/50 (!) 142/63  Pulse: 64 75 74 (!) 58  Resp:   20 18  Temp: 98 F (36.7 C) 97.7 F (36.5 C) 98.2 F (36.8 C) 98.9 F (37.2 C)  TempSrc: Oral Oral Oral Oral  SpO2: 97% 100% 98% 97%  Weight:      Height:       Weight change:   Intake/Output Summary (Last 24 hours) at 03/07/17 0837 Last data filed at 03/06/17 1842  Gross per 24 hour  Intake              280 ml  Output              600 ml  Net             -320 ml     Exam: Heart:: Regular rate and rhythm, S1S2 present or without murmur or extra heart sounds Lungs: normal, clear to auscultation and clear to auscultation and percussion Abdomen: soft, nontender, normal bowel sounds   Lab Results: @ Micro Results: No results found for this or any previous visit (from the past 240 hour(s)). Studies/Results: Mr Brain Wo Contrast  Result Date: 03/06/2017 CLINICAL DATA:  83 year old male with confusion an declining mental status. Hyponatremia. Decreased p.o. intake. Recent fall. EXAM: MRI HEAD WITHOUT CONTRAST TECHNIQUE: Multiplanar, multiecho pulse sequences of the brain and surrounding structures were obtained without intravenous contrast. COMPARISON:  Head CT without contrast 12/06/2016. Brain MRI 01/21/2014 FINDINGS: Brain: Mild generalized cerebral volume loss since 2015. No restricted diffusion to suggest acute infarction. No midline shift, mass effect, evidence of mass lesion, ventriculomegaly, extra-axial collection or acute intracranial hemorrhage. Cervicomedullary junction and pituitary are within normal limits. Chronic posterior inferior right  cerebellar infarct is unchanged since 2015. Patchy T2 heterogeneity in the pons is only mildly progressed since 2015. T2 heterogeneity in the bilateral deep gray matter nuclei -mostly the basal ganglia- is mildly progressed. Similar mild progression of chronic Patchy and confluent right greater than left cerebral white matter T2 and FLAIR hyperintensity. No cerebral cortical encephalomalacia or chronic cerebral blood products identified. Vascular: Major intracranial vascular flow voids are preserved. Skull and upper cervical spine: Negative. Normal bone marrow signal. Sinuses/Orbits: Stable and negative. Other: Visible internal auditory structures remain normal. Negative scalp soft tissues. IMPRESSION: 1.  No acute intracranial abnormality. 2. Chronic small and medium-sized vessel ischemia with mild progression since 2015. Electronically Signed   By: Odessa Fleming M.D.   On: 03/06/2017 16:02   Ct Abdomen Pelvis W Contrast  Result Date: 03/06/2017 CLINICAL DATA:  Inpatient. Weight loss (36 pounds over the prior year). Failure to thrive. EXAM: CT ABDOMEN AND PELVIS WITH CONTRAST TECHNIQUE: Multidetector CT imaging of the abdomen and pelvis was performed using the standard protocol following bolus administration of intravenous contrast. CONTRAST:  ISOVUE-300 IOPAMIDOL (ISOVUE-300) INJECTION 61% COMPARISON:  07/27/2005 CT abdomen/ pelvis. FINDINGS: Motion degraded scan. Lower chest: Nonspecific patchy subpleural reticulation and parenchymal banding at the lung bases. Visualized lower sternotomy wire appears intact. Hepatobiliary: Normal liver with no liver mass. Normal gallbladder with no radiopaque cholelithiasis. No biliary ductal dilatation. Pancreas: Normal, with no  mass or duct dilation. Spleen: Normal size. No mass. Adrenals/Urinary Tract: Normal adrenals. No hydronephrosis. No renal masses. Mild diffuse bladder wall thickening. Stomach/Bowel: Grossly normal stomach. Nonspecific mild circumferential wall  thickening in the descending duodenum (series 7/image 11). Normal caliber small bowel with no small bowel wall thickening. Normal appendix. Mild diffuse colonic diverticulosis, with no large bowel wall thickening or pericolonic fat stranding. Vascular/Lymphatic: Atherosclerotic nonaneurysmal abdominal aorta. Patent portal, splenic and renal veins. No pathologically enlarged lymph nodes in the abdomen or pelvis. Reproductive: Mildly enlarged prostate. Other: No pneumoperitoneum, ascites or focal fluid collection. Possible small fat containing bilateral inguinal hernias. Musculoskeletal: No aggressive appearing focal osseous lesions. Bilateral L5 pars defects with prominent 19 mm anterolisthesis and severe degenerative disc disease at L5-S1. Severe thoracolumbar spondylosis. IMPRESSION: 1. No lymphadenopathy or findings of metastatic disease in the abdomen or pelvis. 2. Nonspecific mild circumferential wall thickening in the descending duodenum, cannot exclude duodenitis, peptic ulcer disease or neoplasm. Consider upper endoscopic correlation as clinically warranted. 3. Nonspecific mild diffuse bladder wall thickening, recommend correlation with urinalysis to exclude acute cystitis. 4. Additional findings include aortic atherosclerosis, mildly enlarged prostate and mild diffuse colonic diverticulosis. Electronically Signed   By: Delbert Phenix M.D.   On: 03/06/2017 19:11   Medications: I have reviewed the patient's current medications. Scheduled Meds: . aspirin  81 mg Oral Daily  . cholecalciferol  1,000 Units Oral Daily  . enoxaparin (LOVENOX) injection  40 mg Subcutaneous Q24H  . feeding supplement (ENSURE ENLIVE)  237 mL Oral TID BM  . ferrous sulfate  325 mg Oral Q M,W,F  . gabapentin  100 mg Oral QHS  . metoprolol tartrate  12.5 mg Oral BID  . pantoprazole  40 mg Oral BID AC  . simvastatin  10 mg Oral QHS  . vitamin C  500 mg Oral Daily   Continuous Infusions: PRN Meds:.albuterol, alum & mag  hydroxide-simeth, hydrALAZINE, lidocaine-prilocaine, ondansetron **OR** ondansetron (ZOFRAN) IV, senna-docusate   Assessment: Active Problems:   Hyponatremia  Bobby [name] 83 y.o. male being evaluated for unintentional weight loss ,CT abdomen shows circumeferential wall thickening in the descending duodenum - neoplasm cannot be excluded   Plan:  1. EGD today to evaluate abnormality seen on CT scan  2. If EGD is negative suggest out patient GI follow up to discuss if colonoscopy would be the next step with evaluation of weight loss +/- CT chest  3. He has metabolic acidosis on BMP from yesterday - suggest to repeat and if HCo3 is lower will need ABG.  4. Suggest checking TSH as part of weight loss evaluation.    LOS: 3 days   Wyline Mood 03/07/2017, 8:37 AM

## 2017-03-07 NOTE — Care Management Important Message (Signed)
Important Message  Patient Details  Name: Bobby [name] MRN: [id number] Date of Birth: 1932/01/10   Medicare Important Message Given:  Yes    Gwenette Greet, RN 03/07/2017, 9:20 AM

## 2017-03-07 NOTE — Progress Notes (Signed)
Calorie Count Note  48 hour calorie count ordered. Day 1 results below:  Diet: Heart Healthy (previously Soft) Supplements: Ensure Enlive po TID, each supplement provides 350 kcal and 20 grams of protein  Breakfast 4/15: 153 kcal, 7 grams of protein (100% of peach yogurt, breakfast potatoes, coffee with 1 creamer) Lunch 4/15: N/A (pt was NPO) Dinner 4/15: 190 kcal, 11 grams of protein (chicken finger, banana) Supplements: 350 kcal, 20 grams of protein (1 bottle Ensure Enlive)  Total intake: 693 kcal (44% of minimum estimated needs)  38 grams of protein (48% of minimum estimated needs)  Estimated Nutritional Needs:  Kcal:  1585-1850 (MSJ x 1.2-1.4) Protein:  80-95 grams (1.2-1.4 grams/kg) Fluid:  1.7 L/day (25 ml/kg)  Nutrition Dx: Malnutrition (Severe) related to chronic illness as evidenced by severe depletion of body fat, severe depletion of muscle mass.  Goal: Patient will meet greater than or equal to 90% of their needs  Intervention: -Ensure Enlive po TID, each supplement provides 350 kcal and 20 grams of protein.   Helane Rima, MS, RD, LDN Pager: [phone] After Hours Pager: [phone]

## 2017-03-07 NOTE — H&P (Signed)
Wyline Mood MD [address] [place], [place] [address] Phone: [phone] Fax : [phone]  Primary Care Physician:  Danella Penton, MD Primary Gastroenterologist:  Dr. Wyline Mood   Pre-Procedure History & Physical: HPI:  Bobby [name] is a 83 y.o. male is here for an endoscopy.   Past Medical History:  Diagnosis Date  . Arthritis    (R)TKR 5/13 [facility]  . Coronary artery disease    2 stents  . GERD (gastroesophageal reflux disease)   . Myocardial infarction (HCC)    NSTEMI 11/11/12 @ [facility]  . Peripheral vascular disease [facility])    (L)CEA 2007 [place]  . PONV (postoperative nausea and vomiting)   . Stroke [facility]) 2015    Past Surgical History:  Procedure Laterality Date  . CARDIAC CATHETERIZATION     11/11/12 [facility],2000,2006 w/ stents  . CATARACT EXTRACTION    . CORONARY ARTERY BYPASS GRAFT  11/16/2012   Procedure: CORONARY ARTERY BYPASS GRAFTING (CABG);  Surgeon: Kerin Perna, MD;  Location: [facility] OR;  Service: Open Heart Surgery;  Laterality: N/A;  times four using Bilateral Greater Saphenous Vein Graft harvested endoscopically from bilateral thighs, and Left Internal Mammary Artery.  Marland Kitchen HERNIA REPAIR  01/17/13   Dr Renda Rolls @ [facility]  . INTRAOPERATIVE TRANSESOPHAGEAL ECHOCARDIOGRAM  11/16/2012   Procedure: INTRAOPERATIVE TRANSESOPHAGEAL ECHOCARDIOGRAM;  Surgeon: Kerin Perna, MD;  Location: [facility] OR;  Service: Open Heart Surgery;  Laterality: N/A;  . JOINT REPLACEMENT     (R)TKR 5/13 [facility]  . VASCULAR SURGERY     (R)TKR 5/13 [facility]    Prior to Admission medications   Medication Sig Start Date End Date Taking? Authorizing Provider  aspirin 81 MG tablet Take 81 mg by mouth daily. For pain   Yes Historical Provider, MD  Bee Pollen 1000 MG TABS Take 2,000 mg by mouth 2 (two) times daily.   Yes Historical Provider, MD  Cholecalciferol (VITAMIN D3) 1000 units CAPS Take 1,000 Units by mouth daily.   Yes Historical Provider, MD  etodolac (LODINE) 400 MG tablet Take 400  mg by mouth 2 (two) times daily.   Yes Historical Provider, MD  ferrous sulfate 325 (65 FE) MG tablet Take 325 mg by mouth every Monday, Wednesday, and Friday.   Yes Historical Provider, MD  gabapentin (NEURONTIN) 100 MG capsule Take 100 mg by mouth at bedtime.   Yes Historical Provider, MD  metoprolol tartrate (LOPRESSOR) 25 MG tablet Take 0.5 tablets (12.5 mg total) by mouth 2 (two) times daily. 11/21/12  Yes Erin R Barrett, PA-C  Multiple Vitamins-Minerals (OCUVITE PRESERVISION PO) Take 1 tablet by mouth 2 (two) times daily.   Yes Historical Provider, MD  omeprazole (PRILOSEC) 20 MG capsule Take 20 mg by mouth daily.  11/28/12  Yes Historical Provider, MD  simvastatin (ZOCOR) 20 MG tablet Take 10 mg by mouth at bedtime.   Yes Historical Provider, MD  vitamin C (ASCORBIC ACID) 500 MG tablet Take 500 mg by mouth daily.   Yes Historical Provider, MD  senna-docusate (SENOKOT-S) 8.6-50 MG per tablet Take 1 tablet by mouth daily as needed for constipation.    Historical Provider, MD    Allergies as of 03/04/2017 - Review Complete 03/04/2017  Allergen Reaction Noted  . Percocet [oxycodone-acetaminophen]  02/07/2013  . Septra [sulfamethoxazole-trimethoprim]  02/07/2013  . Tramadol hcl Other (See Comments) 11/14/2012    History reviewed. No pertinent family history.  Social History   Social History  . Marital status: Married  Spouse name: N/A  . Number of children: N/A  . Years of education: N/A   Occupational History  . Not on file.   Social History Main Topics  . Smoking status: Former Smoker    Types: Cigars    Quit date: 10/22/1989  . Smokeless tobacco: Never Used  . Alcohol use No  . Drug use: No  . Sexual activity: Not on file   Other Topics Concern  . Not on file   Social History Narrative  . No narrative on file    Review of Systems: See HPI, otherwise negative ROS  Physical Exam: BP (!) 144/62   Pulse 68   Temp 97.4 F (36.3 C) (Tympanic)   Resp 18   Ht   (1.676 m)   Wt 148 lb (67.1 kg) Comment: bed scale  SpO2 98%   BMI 23.89 kg/m  General:   Alert,  pleasant and cooperative in NAD Head:  Normocephalic and atraumatic. Neck:  Supple; no masses or thyromegaly. Lungs:  Clear throughout to auscultation.    Heart:  Regular rate and rhythm. Abdomen:  Soft, nontender and nondistended. Normal bowel sounds, without guarding, and without rebound.   Neurologic:  Alert and  oriented x4;  grossly normal neurologically.  Impression/Plan: Bobby [name] is here for an endoscopy to be performed for an abnormality of the duodenum seen on CT scan of the abdomen   Risks, benefits, limitations, and alternatives regarding  endoscopy have been reviewed with the patient.  Questions have been answered.  All parties agreeable.   Wyline Mood, MD  03/07/2017, 9:52 AM

## 2017-03-07 NOTE — Anesthesia Preprocedure Evaluation (Signed)
Anesthesia Evaluation  Patient identified by MRN, date of birth, ID band Patient awake    Reviewed: Allergy & Precautions, H&P , NPO status , Patient's Chart, lab work & pertinent test results, reviewed documented beta blocker date and time   History of Anesthesia Complications (+) PONV and history of anesthetic complications  Airway Mallampati: III  TM Distance: >3 FB Neck ROM: full    Dental  (+) Edentulous Upper, Edentulous Lower   Pulmonary neg pulmonary ROS, former smoker,           Cardiovascular Exercise Tolerance: Good (-) hypertension(-) angina+ CAD, + Past MI, + Cardiac Stents, + CABG and + Peripheral Vascular Disease  (-) dysrhythmias (-) Valvular Problems/Murmurs     Neuro/Psych neg Seizures CVA, No Residual Symptoms negative psych ROS   GI/Hepatic Neg liver ROS, GERD  ,  Endo/Other  negative endocrine ROS  Renal/GU negative Renal ROS  negative genitourinary   Musculoskeletal   Abdominal   Peds  Hematology negative hematology ROS (+)   Anesthesia Other Findings Past Medical History: No date: Arthritis     Comment: (R)TKR 5/13 [facility] No date: Coronary artery disease     Comment: 2 stents No date: GERD (gastroesophageal reflux disease) No date: Myocardial infarction [facility])     Comment: NSTEMI 11/11/12 @ [facility] No date: Peripheral vascular disease (HCC)     Comment: (L)CEA 2007 [place] No date: PONV (postoperative nausea and vomiting) 2015: Stroke (HCC)   Reproductive/Obstetrics negative OB ROS                             Anesthesia Physical Anesthesia Plan  ASA: III  Anesthesia Plan: General   Post-op Pain Management:    Induction:   Airway Management Planned:   Additional Equipment:   Intra-op Plan:   Post-operative Plan:   Informed Consent: I have reviewed the patients History and Physical, chart, labs and discussed the procedure including the risks,  benefits and alternatives for the proposed anesthesia with the patient or authorized representative who has indicated his/her understanding and acceptance.   Dental Advisory Given  Plan Discussed with: Anesthesiologist, CRNA and Surgeon  Anesthesia Plan Comments:         Anesthesia Quick Evaluation

## 2017-03-07 NOTE — Care Management (Signed)
Admitted to this facility with the diagnosis of hyponatremia. Lives with wife, Alona Bene, [phone]). Last seen Dr. Hyacinth Meeker March 15th 2018. [facility] in 2012 following Total knee replacement. No skilled facility. [place], rolling walker, and cane in the home. Toilet is handicap accessible. Three falls prior to this admission. Decreased appetite. Lost 36 pounds in the last year. Takes care of all basic activities of daily living himself, doesn't drive. Prescriptions are filled at [facility] on [address]. Physical therapy evaluation completed. Recommending skilled nursing facility. Family is in agreement with this plan. Gwenette Greet RN MSN [facility] Care Management [phone]

## 2017-03-07 NOTE — Transfer of Care (Signed)
Immediate Anesthesia Transfer of Care Note  Patient: Bobby [name]  Procedure(s) Performed: Procedure(s): ESOPHAGOGASTRODUODENOSCOPY (EGD) WITH PROPOFOL (N/A)  Patient Location: PACU  Anesthesia Type:General  Level of Consciousness: awake and sedated  Airway & Oxygen Therapy: Patient Spontanous Breathing and Patient connected to nasal cannula oxygen  Post-op Assessment: Report given to RN and Post -op Vital signs reviewed and stable  Post vital signs: Reviewed and stable  Last Vitals:  Vitals:   03/07/17 0439 03/07/17 0945  BP: (!) 142/63 (!) 144/62  Pulse: (!) 58 68  Resp: 18 18  Temp: 37.2 C 36.3 C    Last Pain:  Vitals:   03/07/17 0945  TempSrc: Tympanic  PainSc:          Complications: No apparent anesthesia complications

## 2017-03-07 NOTE — Anesthesia Procedure Notes (Signed)
Performed by: COOK-MARTIN, Aarna Mihalko Pre-anesthesia Checklist: Patient identified, Emergency Drugs available, Suction available, Patient being monitored and Timeout performed Patient Re-evaluated:Patient Re-evaluated prior to inductionOxygen Delivery Method: Nasal cannula Preoxygenation: Pre-oxygenation with 100% oxygen Intubation Type: IV induction Airway Equipment and Method: Bite block Placement Confirmation: CO2 detector and positive ETCO2     

## 2017-03-07 NOTE — Anesthesia Post-op Follow-up Note (Cosign Needed)
Anesthesia QCDR form completed.        

## 2017-03-08 ENCOUNTER — Encounter: Payer: Self-pay | Admitting: Gastroenterology

## 2017-03-08 ENCOUNTER — Inpatient Hospital Stay (HOSPITAL_COMMUNITY)
Admit: 2017-03-08 | Discharge: 2017-03-08 | Disposition: A | Discharge status: Home / Self Care | Payer: Medicare HMO | Attending: Internal Medicine | Admitting: Internal Medicine

## 2017-03-08 DIAGNOSIS — G9341 Metabolic encephalopathy: Secondary | ICD-10-CM

## 2017-03-08 DIAGNOSIS — F0390 Unspecified dementia without behavioral disturbance: Secondary | ICD-10-CM

## 2017-03-08 DIAGNOSIS — Z9889 Other specified postprocedural states: Secondary | ICD-10-CM

## 2017-03-08 DIAGNOSIS — E222 Syndrome of inappropriate secretion of antidiuretic hormone: Secondary | ICD-10-CM

## 2017-03-08 DIAGNOSIS — M79606 Pain in leg, unspecified: Secondary | ICD-10-CM

## 2017-03-08 DIAGNOSIS — E46 Unspecified protein-calorie malnutrition: Secondary | ICD-10-CM

## 2017-03-08 DIAGNOSIS — E871 Hypo-osmolality and hyponatremia: Secondary | ICD-10-CM

## 2017-03-08 DIAGNOSIS — R3915 Urgency of urination: Secondary | ICD-10-CM

## 2017-03-08 DIAGNOSIS — K269 Duodenal ulcer, unspecified as acute or chronic, without hemorrhage or perforation: Secondary | ICD-10-CM

## 2017-03-08 DIAGNOSIS — R011 Cardiac murmur, unspecified: Secondary | ICD-10-CM

## 2017-03-08 LAB — BASIC METABOLIC PANEL
Anion gap: 7 (ref 5–15)
BUN: 27 mg/dL — ABNORMAL HIGH (ref 6–20)
CALCIUM: 8.7 mg/dL — AB (ref 8.9–10.3)
CO2: 22 mmol/L (ref 22–32)
Chloride: 96 mmol/L — ABNORMAL LOW (ref 101–111)
Creatinine, Ser: 0.87 mg/dL (ref 0.61–1.24)
GLUCOSE: 94 mg/dL (ref 65–99)
Potassium: 4.7 mmol/L (ref 3.5–5.1)
SODIUM: 125 mmol/L — AB (ref 135–145)

## 2017-03-08 LAB — ECHOCARDIOGRAM COMPLETE
Height: 66 in
Weight: 2464 oz

## 2017-03-08 MED ORDER — LIDOCAINE-PRILOCAINE 2.5-2.5 % EX CREA: TOPICAL_CREAM | CUTANEOUS | 0 refills | Status: AC | PRN

## 2017-03-08 MED ORDER — PANTOPRAZOLE SODIUM 40 MG PO TBEC: 40.0000 mg | DELAYED_RELEASE_TABLET | Freq: Every day | ORAL | 3 refills | Status: AC

## 2017-03-08 MED ORDER — ENSURE ENLIVE PO LIQD
237.0000 mL | Freq: Two times a day (BID) | ORAL | Status: DC
  Administered 2017-03-08 – 2017-03-11 (×7): 237 mL via ORAL

## 2017-03-08 MED ORDER — ENSURE ENLIVE PO LIQD: 237.0000 mL | Freq: Three times a day (TID) | ORAL | 12 refills | Status: AC

## 2017-03-08 NOTE — Progress Notes (Signed)
Nutrition Follow-up  DOCUMENTATION CODES:   Severe malnutrition in context of chronic illness  INTERVENTION:  Can decrease to Ensure Enlive po BID, each supplement provides 350 kcal and 20 grams of protein.  Patient would benefit from fluid restriction in setting of hyponatremia.  Will discontinue calorie count as 48 hours are complete.  NUTRITION DIAGNOSIS:   Malnutrition (Severe) related to chronic illness as evidenced by severe depletion of body fat, severe depletion of muscle mass.  Ongoing.  GOAL:   Patient will meet greater than or equal to 90% of their needs  Progressing.  MONITOR:   PO intake, Supplement acceptance, Labs, Weight trends, I & O's  REASON FOR ASSESSMENT:   Malnutrition Screening Tool    ASSESSMENT:   83 year old male with PMHx of CAD, MI in 2013, PVD, history of stroke admitted with hyponatremia, dehydration, metabolic encephalopathy, weakness.  -EGD 4/16 found two non-bleeding duodenal ulcers (one in duodenal bulb, one in second portion of duodenum).   Spoke with patient and family at bedside. Family reports patient's appetite has improved after his EGD. Denies any N/V or abdominal pain.  Meal Completion: 80-100% now Per calorie count patient met 76% calorie needs and 100% protein needs.  Medications reviewed and include: vitamin D 1000 units daily, ferrous sulfate 325 mg every M/W/F, pantoprazole, vitamin C 500 mg daily.  Labs reviewed: Sodium 125, Chloride 96, BUN 27.   Diet Order:  DIET SOFT Room service appropriate? Yes; Fluid consistency: Thin Diet - low sodium heart healthy  Skin:  Reviewed, no issues  Last BM:  Unknown  Height:   Ht Readings from Last 1 Encounters:  03/04/17 _0  (1.676 m)    Weight:   Wt Readings from Last 1 Encounters:  03/08/17 154 lb (69.9 kg)    Ideal Body Weight:  64.5 kg  BMI:  Body mass index is 24.86 kg/m.  Estimated Nutritional Needs:   Kcal:  1585-1850 (MSJ x 1.2-1.4)  Protein:   80-95 grams (1.2-1.4 grams/kg)  Fluid:  1.7 L/day (25 ml/kg)  EDUCATION NEEDS:   Education needs no appropriate at this time  Willey Blade, MS, RD, LDN Pager: [phone] After Hours Pager: [phone]

## 2017-03-08 NOTE — Discharge Summary (Signed)
[facility] - [place] [facility]   PATIENT NAME: Bobby [name]    MR#:  [id number]  DATE OF BIRTH:  [birth year]  DATE OF ADMISSION:  03/04/2017 ADMITTING PHYSICIAN: Shaune Pollack, MD  DATE OF DISCHARGE: No discharge date for patient encounter.  PRIMARY CARE PHYSICIAN: Danella Penton, MD     ADMISSION DIAGNOSIS:  Hyponatremia [E87.1] Abdominal pain, unspecified abdominal location [R10.9] Altered mental status, unspecified altered mental status type [R41.82]  DISCHARGE DIAGNOSIS:  Principal Problem:   Hyponatremia Active Problems:   Acute metabolic encephalopathy   SIADH (syndrome of inappropriate ADH production) (HCC)   Malnutrition (HCC)   Duodenal ulcer   History of esophagogastroduodenoscopy (EGD)   Right leg pain   Urinary urgency   Dementia   SECONDARY DIAGNOSIS:   Past Medical History:  Diagnosis Date  . Arthritis    (R)TKR 5/13 [facility]  . Coronary artery disease    2 stents  . GERD (gastroesophageal reflux disease)   . Myocardial infarction (HCC)    NSTEMI 11/11/12 @ [facility]  . Peripheral vascular disease [facility])    (L)CEA 2007 [place]  . PONV (postoperative nausea and vomiting)   . Stroke (HCC) 2015    .pro HOSPITAL COURSE:  The patient is 83 year old Caucasian male with past medical history significant for history of coronary artery disease, gastroesophageal reflux disease, peripheral vascular disease, arthritis, who presents to the hospital with complaints of indigestion, confusion, declining medical status. Apparently,. Patient fell down and hurt his leg, now is unable to walk well due to right lower extremity pain. He was noted to have skin pressure changes. Sodium level was found to be low as 124. Patient's family admited of poor patient's oral intake, bloating and discomfort in the chest and epigastric area, thought to be related to underlying coronary artery disease. Patient was initiated on IV fluids and his sodium level  initially improved, however, declined later on on IV fluid administration. Troponin remained normal,  urine osmolarity was checked and was noted to be higher than serum. Patient's family reported about 36 pound weight loss over the past one year. Patient was seen by gastroenterologist and recommended CT of abdomen and pelvis to look for any malignancy,  CT of abdomen and pelvis revealed thickening and duodenal area concerning for ulcer versus malignancy, EGD was recommended, patient underwent EGD by Dr. Tobi Bastos, revealing duodenal ulcer, advancement of diet was recommended and Protonix 40 a day for 10 weeks, follow-up with gastroenterologist as outpatient. . Patient was evaluated by physical therapist and recommended skilled nursing facility placement for rehabilitation. It was felt the patient is stable to be discharged today. Discussion by problem: #1. Hyponatremia due to dehydration and SIADH, improved with IV fluid administration, but then worsened again, patient's urine osmolarity was higher than room, suspected SIADH,,  initiated fluid restrictions of 1400 cc over the 24-hour period,  follow sodium level as outpatient closely, follow oral intake to make sure patient is not getting dehydrated.  #2 failure to thrive adult, likely etiology was duodenal ulcer, continue soft diet, Protonix daily, appreciate gastroenterologist input, CT scan of abdomen and pelvis showed no malignancy, status post EGD, revealing duodenal ulcer, nonbleeding, gastroenterologist follow-up as outpatient was recommended. Continue calorie count as outpatient.  #3. Right lower extremity pain, suspected radicular, MRI of lumbar spine revealed bilateral L4 pars defects with anterolisthesis and severe right greater than left foraminal stenosis, patient has an appointment with neurosurgery within next few days, continue EMLA cream when necessary,  improved overall, patient was seen by physical therapist and recommended skilled nursing facility  placement. Follow-up with neurosurgery as outpatient. The patient also complains of recent onset of urinary or urgency, which could be related to neurologic issues/back problems, however, cannot rule out urologic problems, dementia. Patient is to follow-up with neurosurgery and the urology as needed #4. Chest discomfort, patient was seen by cardiologist, no recommendations for cardiac evaluation at present. Continue current therapy with Lopressor, Zocor, cardiac enzymes were negative , off aspirin for short period of time due to duodenal ulcer, follow-up was gastroenterologist, and reinstate aspirin therapy per gastroenterologist recommendations #5. Encephalopathy of unclear etiology, suspected metabolic due to hyponatremia, also likely due to dementia, brain MRI revealed no recent stroke. The neurologist  recommendations are pending #6 cardiac murmur, getting echocardiogram, results are pending, should not change discharge planning overall.   DISCHARGE CONDITIONS:   Stable  CONSULTS OBTAINED:  Treatment Team:  Alwyn Pea, MD Wyline Mood, MD Thana Farr, MD  DRUG ALLERGIES:   Allergies  Allergen Reactions  . Percocet [Oxycodone-Acetaminophen]     Hallucinations   . Septra [Sulfamethoxazole-Trimethoprim]   . Tramadol Hcl Other (See Comments)    Hallucinations    DISCHARGE MEDICATIONS:   Current Discharge Medication List    START taking these medications   Details  feeding supplement, ENSURE ENLIVE, (ENSURE ENLIVE) LIQD Take 237 mLs by mouth 3 (three) times daily between meals. Qty: 90 Bottle, Refills: 12    lidocaine-prilocaine (EMLA) cream Apply topically as needed (pain). Qty: 30 g, Refills: 0    pantoprazole (PROTONIX) 40 MG tablet Take 1 tablet (40 mg total) by mouth daily. Qty: 30 tablet, Refills: 3      CONTINUE these medications which have NOT CHANGED   Details  Bee Pollen 1000 MG TABS Take 2,000 mg by mouth 2 (two) times daily.    Cholecalciferol  (VITAMIN D3) 1000 units CAPS Take 1,000 Units by mouth daily.    ferrous sulfate 325 (65 FE) MG tablet Take 325 mg by mouth every Monday, Wednesday, and Friday.    gabapentin (NEURONTIN) 100 MG capsule Take 100 mg by mouth at bedtime.    metoprolol tartrate (LOPRESSOR) 25 MG tablet Take 0.5 tablets (12.5 mg total) by mouth 2 (two) times daily. Qty: 30 tablet, Refills: 1    Multiple Vitamins-Minerals (OCUVITE PRESERVISION PO) Take 1 tablet by mouth 2 (two) times daily.    simvastatin (ZOCOR) 20 MG tablet Take 10 mg by mouth at bedtime.    vitamin C (ASCORBIC ACID) 500 MG tablet Take 500 mg by mouth daily.    senna-docusate (SENOKOT-S) 8.6-50 MG per tablet Take 1 tablet by mouth daily as needed for constipation.      STOP taking these medications     aspirin 81 MG tablet      etodolac (LODINE) 400 MG tablet      omeprazole (PRILOSEC) 20 MG capsule          DISCHARGE INSTRUCTIONS:    The patient is to follow-up with primary care physician, neurosurgery as outpatient  If you experience worsening of your admission symptoms, develop shortness of breath, life threatening emergency, suicidal or homicidal thoughts you must seek medical attention immediately by calling 911 or calling your MD immediately  if symptoms less severe.  You Must read complete instructions/literature along with all the possible adverse reactions/side effects for all the Medicines you take and that have been prescribed to you. Take any new Medicines after you have completely understood and  accept all the possible adverse reactions/side effects.   Please note  You were cared for by a hospitalist during your hospital stay. If you have any questions about your discharge medications or the care you received while you were in the hospital after you are discharged, you can call the unit and asked to speak with the hospitalist on call if the hospitalist that took care of you is not available. Once you are discharged,  your primary care physician will handle any further medical issues. Please note that NO REFILLS for any discharge medications will be authorized once you are discharged, as it is imperative that you return to your primary care physician (or establish a relationship with a primary care physician if you do not have one) for your aftercare needs so that they can reassess your need for medications and monitor your lab values.    Today   CHIEF COMPLAINT:   Chief Complaint  Patient presents with  . Chest Pain    HISTORY OF PRESENT ILLNESS:    VITAL SIGNS:  Blood pressure (!) 116/59, pulse 75, temperature 98.7 F (37.1 C), temperature source Oral, resp. rate 18, height  (1.676 m), weight 69.9 kg (154 lb), SpO2 96 %.  I/O:   Intake/Output Summary (Last 24 hours) at 03/08/17 0950 Last data filed at 03/07/17 1904  Gross per 24 hour  Intake              240 ml  Output                0 ml  Net              240 ml    PHYSICAL EXAMINATION:  GENERAL:  83 y.o.-year-old patient lying in the bed with no acute distress.  EYES: Pupils equal, round, reactive to light and accommodation. No scleral icterus. Extraocular muscles intact.  HEENT: Head atraumatic, normocephalic. Oropharynx and nasopharynx clear.  NECK:  Supple, no jugular venous distention. No thyroid enlargement, no tenderness.  LUNGS: Normal breath sounds bilaterally, no wheezing, rales,rhonchi or crepitation. No use of accessory muscles of respiration.  CARDIOVASCULAR: S1, S2 normal. 2. 2/6 systolic murmur was heard precordially with radiation to the neck, no rubs, or gallops.  ABDOMEN: Soft, non-tender, non-distended. Bowel sounds present. No organomegaly or mass.  EXTREMITIES: No pedal edema, cyanosis, or clubbing.  NEUROLOGIC: Cranial nerves II through XII are intact. Muscle strength 5/5 in all extremities. Sensation intact. Gait not checked.  PSYCHIATRIC: The patient is alert and oriented x 3.  SKIN: No obvious rash, lesion,  or ulcer.   DATA REVIEW:   CBC  Recent Labs Lab 03/04/17 0906 03/06/17 0655  WBC 7.5  --   HGB 10.4* 9.9*  HCT 29.4*  --   PLT 204  --     Chemistries   Recent Labs Lab 03/04/17 0906  03/08/17 0404  NA 124*  < > 125*  K 4.5  < > 4.7  CL 95*  < > 96*  CO2 23  < > 22  GLUCOSE 96  < > 94  BUN 26*  < > 27*  CREATININE 1.12  < > 0.87  CALCIUM 9.0  < > 8.7*  MG 1.8  --   --   < > = values in this interval not displayed.  Cardiac Enzymes  Recent Labs Lab 03/05/17 0915  TROPONINI <0.03    Microbiology Results  Results for orders placed or performed during the hospital encounter of 11/14/12  Surgical  pcr screen     Status: Abnormal   Collection Time: 11/14/12  5:28 PM  Result Value Ref Range Status   MRSA, PCR NEGATIVE NEGATIVE Final   Staphylococcus aureus POSITIVE (A) NEGATIVE Final    Comment:        The Xpert SA Assay (FDA approved for NASAL specimens in patients over 20 years of age), is one component of a comprehensive surveillance program.  Test performance has been validated by [employer] for patients greater than or equal to 37 year old. It is not intended to diagnose infection nor to guide or monitor treatment. RESULT CALLED TO, READ BACK BY AND VERIFIED WITH: RN S. TERVITTE 11/14/12 2110 Stephannie Li    RADIOLOGY:  Mr Brain Wo Contrast  Result Date: 03/06/2017 CLINICAL DATA:  83 year old male with confusion an declining mental status. Hyponatremia. Decreased p.o. intake. Recent fall. EXAM: MRI HEAD WITHOUT CONTRAST TECHNIQUE: Multiplanar, multiecho pulse sequences of the brain and surrounding structures were obtained without intravenous contrast. COMPARISON:  Head CT without contrast 12/06/2016. Brain MRI 01/21/2014 FINDINGS: Brain: Mild generalized cerebral volume loss since 2015. No restricted diffusion to suggest acute infarction. No midline shift, mass effect, evidence of mass lesion, ventriculomegaly, extra-axial collection or acute intracranial  hemorrhage. Cervicomedullary junction and pituitary are within normal limits. Chronic posterior inferior right cerebellar infarct is unchanged since 2015. Patchy T2 heterogeneity in the pons is only mildly progressed since 2015. T2 heterogeneity in the bilateral deep gray matter nuclei -mostly the basal ganglia- is mildly progressed. Similar mild progression of chronic Patchy and confluent right greater than left cerebral white matter T2 and FLAIR hyperintensity. No cerebral cortical encephalomalacia or chronic cerebral blood products identified. Vascular: Major intracranial vascular flow voids are preserved. Skull and upper cervical spine: Negative. Normal bone marrow signal. Sinuses/Orbits: Stable and negative. Other: Visible internal auditory structures remain normal. Negative scalp soft tissues. IMPRESSION: 1.  No acute intracranial abnormality. 2. Chronic small and medium-sized vessel ischemia with mild progression since 2015. Electronically Signed   By: Odessa Fleming M.D.   On: 03/06/2017 16:02   Ct Abdomen Pelvis W Contrast  Result Date: 03/06/2017 CLINICAL DATA:  Inpatient. Weight loss (36 pounds over the prior year). Failure to thrive. EXAM: CT ABDOMEN AND PELVIS WITH CONTRAST TECHNIQUE: Multidetector CT imaging of the abdomen and pelvis was performed using the standard protocol following bolus administration of intravenous contrast. CONTRAST:  ISOVUE-300 IOPAMIDOL (ISOVUE-300) INJECTION 61% COMPARISON:  07/27/2005 CT abdomen/ pelvis. FINDINGS: Motion degraded scan. Lower chest: Nonspecific patchy subpleural reticulation and parenchymal banding at the lung bases. Visualized lower sternotomy wire appears intact. Hepatobiliary: Normal liver with no liver mass. Normal gallbladder with no radiopaque cholelithiasis. No biliary ductal dilatation. Pancreas: Normal, with no mass or duct dilation. Spleen: Normal size. No mass. Adrenals/Urinary Tract: Normal adrenals. No hydronephrosis. No renal masses. Mild  diffuse bladder wall thickening. Stomach/Bowel: Grossly normal stomach. Nonspecific mild circumferential wall thickening in the descending duodenum (series 7/image 11). Normal caliber small bowel with no small bowel wall thickening. Normal appendix. Mild diffuse colonic diverticulosis, with no large bowel wall thickening or pericolonic fat stranding. Vascular/Lymphatic: Atherosclerotic nonaneurysmal abdominal aorta. Patent portal, splenic and renal veins. No pathologically enlarged lymph nodes in the abdomen or pelvis. Reproductive: Mildly enlarged prostate. Other: No pneumoperitoneum, ascites or focal fluid collection. Possible small fat containing bilateral inguinal hernias. Musculoskeletal: No aggressive appearing focal osseous lesions. Bilateral L5 pars defects with prominent 19 mm anterolisthesis and severe degenerative disc disease at L5-S1. Severe thoracolumbar spondylosis. IMPRESSION: 1.  No lymphadenopathy or findings of metastatic disease in the abdomen or pelvis. 2. Nonspecific mild circumferential wall thickening in the descending duodenum, cannot exclude duodenitis, peptic ulcer disease or neoplasm. Consider upper endoscopic correlation as clinically warranted. 3. Nonspecific mild diffuse bladder wall thickening, recommend correlation with urinalysis to exclude acute cystitis. 4. Additional findings include aortic atherosclerosis, mildly enlarged prostate and mild diffuse colonic diverticulosis. Electronically Signed   By: Delbert Phenix M.D.   On: 03/06/2017 19:11    EKG:   Orders placed or performed during the hospital encounter of 03/04/17  . EKG 12-Lead  . EKG 12-Lead      Management plans discussed with the patient, family and they are in agreement.  CODE STATUS:     Code Status Orders        Start     Ordered   03/04/17 1227  Do not attempt resuscitation (DNR)  Continuous    Question Answer Comment  In the event of cardiac or respiratory ARREST Do not call a "code blue"   In  the event of cardiac or respiratory ARREST Do not perform Intubation, CPR, defibrillation or ACLS   In the event of cardiac or respiratory ARREST Use medication by any route, position, wound care, and other measures to relive pain and suffering. May use oxygen, suction and manual treatment of airway obstruction as needed for comfort.      03/04/17 1226    Code Status History    Date Active Date Inactive Code Status Order ID Comments User Context   12/06/2016  4:21 PM 12/09/2016  5:45 PM Full Code [id number]  Auburn Bilberry, MD Inpatient   11/14/2012  5:37 PM 11/16/2012  1:56 PM Full Code [id number]  Ardelle Balls, PA Inpatient    Advance Directive Documentation     Most Recent Value  Type of Advance Directive  Healthcare Power of Attorney, Living will  Pre-existing out of facility DNR order (yellow form or pink MOST form)  -  "MOST" Form in Place?  -      TOTAL TIME TAKING CARE OF THIS PATIENT: 40 minutes.    Katharina Caper M.D on 03/08/2017 at 9:50 AM  Between 7am to 6pm - Pager - [phone]  After 6pm go to [email or web address] - password [email or web address] [facility]  [place] [facility]  Office  [phone]  CC: Primary care physician; Danella Penton, MD

## 2017-03-08 NOTE — Progress Notes (Signed)
*  PRELIMINARY RESULTS* Echocardiogram 2D Echocardiogram has been performed.  Bobby [name] 03/08/2017, 3:32 PM

## 2017-03-08 NOTE — Progress Notes (Signed)
Physical Therapy Treatment Patient Details Name: TYEE VANDEVOORDE MRN: [id number] DOB: [birth year] Today's Date: 03/08/2017    History of Present Illness Pt is a 83 y/o M who was sent to the ED due to indigestion.  Wife and daughter report pt has been confused and weaker than usual.  Pt admitted with hyponatremia and dehydration with acute metabolic encephalopathy. Pt's PMH includes R TKA, NSTEMI, CABG.    PT Comments    Pt in bathroom with CNA upon arrival.  Ambulated to/from with RW and min guard/assist.  Sitting EOB for meds leaning on LUE due to fatigue.  He then continued to ambulate 60' x 2 with walker and min assist.  Overall decreased gait quality and safety. Decreased step length and height with poor walker positioning.  Pt demonstrate decreased safety awareness overall and poor hand placements and approach to bed.  SNF remain appropriate at this time.   Follow Up Recommendations  SNF     Equipment Recommendations  None recommended by PT    Recommendations for Other Services       Precautions / Restrictions Precautions Precautions: Fall Restrictions Weight Bearing Restrictions: No    Mobility  Bed Mobility Overal bed mobility: Needs Assistance Bed Mobility: Sit to Supine     Supine to sit: Min assist        Transfers Overall transfer level: Needs assistance Equipment used: Rolling walker (2 wheeled) Transfers: Sit to/from Stand Sit to Stand: Min assist         General transfer comment: verbal dues for hand placements  Ambulation/Gait Ambulation/Gait assistance: Min guard;Min assist Ambulation Distance (Feet): 60 Feet Assistive device: Rolling walker (2 wheeled)   Gait velocity: decreased Gait velocity interpretation: Below normal speed for age/gender General Gait Details: frequent verbal cues for walker positioning as he lets it drift too far ahead increasing fall risk   Stairs            Wheelchair Mobility    Modified Rankin (Stroke  Patients Only)       Balance Overall balance assessment: Needs assistance;History of Falls Sitting-balance support: Feet supported Sitting balance-Leahy Scale: Good     Standing balance support: Bilateral upper extremity supported;During functional activity Standing balance-Leahy Scale: Poor Standing balance comment: Relies on UE support for static and dynamic activities                            Cognition Arousal/Alertness: Awake/alert Behavior During Therapy: WFL for tasks assessed/performed Overall Cognitive Status: Within Functional Limits for tasks assessed                                        Exercises      General Comments        Pertinent Vitals/Pain Pain Assessment: 0-10 Pain Score: 3  Pain Location: R lower leg with ambualtion, anterior Pain Descriptors / Indicators: Aching;Discomfort Pain Intervention(s): Limited activity within patient's tolerance    Home Living                      Prior Function            PT Goals (current goals can now be found in the care plan section) Progress towards PT goals: Progressing toward goals    Frequency    Min 2X/week      PT Plan Current plan  remains appropriate    Co-evaluation             End of Session Equipment Utilized During Treatment: Gait belt Activity Tolerance: Patient tolerated treatment well;Patient limited by fatigue Patient left: in bed;with bed alarm set;with call bell/phone within reach;with family/visitor present         Time: 1610-9604 PT Time Calculation (min) (ACUTE ONLY): 23 min  Charges:  $Gait Training: 23-37 mins                    G Codes:       Danielle Dess, PTA 03/08/17, 10:22 AM

## 2017-03-08 NOTE — Consult Note (Signed)
Reason for Consult:Confusion Referring Physician: Winona Legato  CC: Confusion  HPI: Bobby [name] is an 83 y.o. male admitted with CP, confusion and hyponatremia.  Wife provides much of the history.  It appears that the patient has been confused for at least a week prior to admission.  Shew thought it was secondary to medications and his medications were adjusted.  Despite this over the weekend the patient became confused to the point that he could not recognize his family.  She reports that today he is better.    Past Medical History:  Diagnosis Date  . Arthritis    (R)TKR 5/13 [facility]  . Coronary artery disease    2 stents  . GERD (gastroesophageal reflux disease)   . Myocardial infarction (HCC)    NSTEMI 11/11/12 @ [facility]  . Peripheral vascular disease [facility])    (L)CEA 2007 [place]  . PONV (postoperative nausea and vomiting)   . Stroke [facility]) 2015    Past Surgical History:  Procedure Laterality Date  . CARDIAC CATHETERIZATION     11/11/12 [facility],2000,2006 w/ stents  . CATARACT EXTRACTION    . CORONARY ARTERY BYPASS GRAFT  11/16/2012   Procedure: CORONARY ARTERY BYPASS GRAFTING (CABG);  Surgeon: Kerin Perna, MD;  Location: [facility] OR;  Service: Open Heart Surgery;  Laterality: N/A;  times four using Bilateral Greater Saphenous Vein Graft harvested endoscopically from bilateral thighs, and Left Internal Mammary Artery.  . ESOPHAGOGASTRODUODENOSCOPY (EGD) WITH PROPOFOL N/A 03/07/2017   Procedure: ESOPHAGOGASTRODUODENOSCOPY (EGD) WITH PROPOFOL;  Surgeon: Wyline Mood, MD;  Location: [facility] ENDOSCOPY;  Service: Endoscopy;  Laterality: N/A;  . HERNIA REPAIR  01/17/13   Dr Renda Rolls @ [facility]  . INTRAOPERATIVE TRANSESOPHAGEAL ECHOCARDIOGRAM  11/16/2012   Procedure: INTRAOPERATIVE TRANSESOPHAGEAL ECHOCARDIOGRAM;  Surgeon: Kerin Perna, MD;  Location: [facility] OR;  Service: Open Heart Surgery;  Laterality: N/A;  . JOINT REPLACEMENT     (R)TKR 5/13 [facility]  . VASCULAR SURGERY     (R)TKR 5/13 [facility]    Family history: Brother with heart murmur.  Father with HTN.  Social History:  reports that he quit smoking about 27 years ago. His smoking use included Cigars. He has never used smokeless tobacco. He reports that he does not drink alcohol or use drugs.  Allergies  Allergen Reactions  . Percocet [Oxycodone-Acetaminophen]     Hallucinations   . Septra [Sulfamethoxazole-Trimethoprim]   . Tramadol Hcl Other (See Comments)    Hallucinations    Medications:  I have reviewed the patient's current medications. Prior to Admission:  Prescriptions Prior to Admission  Medication Sig Dispense Refill Last Dose  . aspirin 81 MG tablet Take 81 mg by mouth daily. For pain   03/03/2017 at 0800  . Bee Pollen 1000 MG TABS Take 2,000 mg by mouth 2 (two) times daily.   Past Week at 0800  . Cholecalciferol (VITAMIN D3) 1000 units CAPS Take 1,000 Units by mouth daily.   Past Week at 0800  . etodolac (LODINE) 400 MG tablet Take 400 mg by mouth 2 (two) times daily.   03/04/2017 at 1000  . ferrous sulfate 325 (65 FE) MG tablet Take 325 mg by mouth every Monday, Wednesday, and Friday.   Past Week at 0700  . gabapentin (NEURONTIN) 100 MG capsule Take 100 mg by mouth at bedtime.   03/03/2017 at 2000  . metoprolol tartrate (LOPRESSOR) 25 MG tablet Take 0.5 tablets (12.5 mg total) by mouth 2 (two) times daily. 30 tablet 1 03/03/2017 at 0800  .  Multiple Vitamins-Minerals (OCUVITE PRESERVISION PO) Take 1 tablet by mouth 2 (two) times daily.   03/03/2017 at 0700  . omeprazole (PRILOSEC) 20 MG capsule Take 20 mg by mouth daily.    03/07/2017 at 0700  . simvastatin (ZOCOR) 20 MG tablet Take 10 mg by mouth at bedtime.   03/03/2017 at 2000  . vitamin C (ASCORBIC ACID) 500 MG tablet Take 500 mg by mouth daily.   Past Week at 0800  . senna-docusate (SENOKOT-S) 8.6-50 MG per tablet Take 1 tablet by mouth daily as needed for constipation.   prn at prn   Scheduled: . aspirin  81 mg Oral Daily  . cholecalciferol  1,000 Units  Oral Daily  . enoxaparin (LOVENOX) injection  40 mg Subcutaneous Q24H  . feeding supplement (ENSURE ENLIVE)  237 mL Oral TID BM  . ferrous sulfate  325 mg Oral Q M,W,F  . gabapentin  100 mg Oral QHS  . metoprolol tartrate  12.5 mg Oral BID  . pantoprazole  40 mg Oral BID AC  . simvastatin  10 mg Oral QHS  . vitamin C  500 mg Oral Daily    ROS: History obtained from the patient  General ROS: negative for - chills, fatigue, fever, night sweats, weight gain or weight loss Psychological ROS: negative for - behavioral disorder, hallucinations, memory difficulties, mood swings or suicidal ideation Ophthalmic ROS: negative for - blurry vision, double vision, eye pain or loss of vision ENT ROS: negative for - epistaxis, nasal discharge, oral lesions, sore throat, tinnitus or vertigo Allergy and Immunology ROS: negative for - hives or itchy/watery eyes Hematological and Lymphatic ROS: negative for - bleeding problems, bruising or swollen lymph nodes Endocrine ROS: negative for - galactorrhea, hair pattern changes, polydipsia/polyuria or temperature intolerance Respiratory ROS: negative for - cough, hemoptysis, shortness of breath or wheezing Cardiovascular ROS: negative for - chest pain, dyspnea on exertion, edema or irregular heartbeat Gastrointestinal ROS: negative for - abdominal pain, diarrhea, hematemesis, nausea/vomiting or stool incontinence Genito-Urinary ROS: negative for - dysuria, hematuria, incontinence or urinary frequency/urgency Musculoskeletal ROS: right leg pain Neurological ROS: as noted in HPI Dermatological ROS: negative for rash and skin lesion changes  Physical Examination: Blood pressure (!) 116/59, pulse 75, temperature 98.7 F (37.1 C), temperature source Oral, resp. rate 18, height  (1.676 m), weight 69.9 kg (154 lb), SpO2 96 %.  HEENT-  Normocephalic, no lesions, without obvious abnormality.  Normal external eye and conjunctiva.  Normal TM's bilaterally.   Normal auditory canals and external ears. Normal external nose, mucus membranes and septum.  Normal pharynx. Cardiovascular- S1, S2 normal, pulses palpable throughout   Lungs- chest clear, no wheezing, rales, normal symmetric air entry Abdomen- soft, non-tender; bowel sounds normal; no masses,  no organomegaly Extremities- no edema Lymph-no adenopathy palpable Musculoskeletal-no joint tenderness, deformity or swelling Skin-warm and dry, no hyperpigmentation, vitiligo, or suspicious lesions  Neurological Examination   Mental Status: Alert.  Oriented to people in the room, name and year.  Unable to tell me the recent holiday.  Very tangential.  Speech fluent without evidence of aphasia.  Able to follow 3 step commands but requires some reinforcement. Cranial Nerves: II: Discs flat bilaterally; Visual fields grossly normal, pupils equal, round, reactive to light and accommodation III,IV, VI: ptosis not present, extra-ocular motions intact bilaterally V,VII: smile symmetric, facial light touch sensation normal bilaterally VIII: hearing normal bilaterally IX,X: gag reflex present XI: bilateral shoulder shrug XII: midline tongue extension Motor: Generalized weakness but patient able to  lift all extremities against gravity with no focal weakness noted.   Sensory: Pinprick and light touch intact throughout, bilaterally Deep Tendon Reflexes: 2+ and symmetric with absent AJ's bilaterally Plantars: Right: upgoing   Left: downgoing Cerebellar: Normal finger-to-nose and normal heel-to-shin testing bilaterally Gait: not tested due to safety concerns    Laboratory Studies:   Basic Metabolic Panel:  Recent Labs Lab 03/04/17 0906 03/05/17 0915 03/06/17 0655 03/07/17 0811 03/08/17 0404  NA 124* 127* 126* 125* 125*  K 4.5 4.6 4.7  --  4.7  CL 95* 98* 99*  --  96*  CO2 23 23 19* 23 22  GLUCOSE 96 86 84  --  94  BUN 26* 20 23*  --  27*  CREATININE 1.12 1.02 0.98  --  0.87  CALCIUM 9.0 9.2  8.5*  --  8.7*  MG 1.8  --   --   --   --     Liver Function Tests: No results for input(s): AST, ALT, ALKPHOS, BILITOT, PROT, ALBUMIN in the last 168 hours. No results for input(s): LIPASE, AMYLASE in the last 168 hours. No results for input(s): AMMONIA in the last 168 hours.  CBC:  Recent Labs Lab 03/04/17 0906 03/06/17 0655  WBC 7.5  --   NEUTROABS 5.5  --   HGB 10.4* 9.9*  HCT 29.4*  --   MCV 94.9  --   PLT 204  --     Cardiac Enzymes:  Recent Labs Lab 03/04/17 0906 03/05/17 0915  TROPONINI <0.03 <0.03    BNP: Invalid input(s): POCBNP  CBG: No results for input(s): GLUCAP in the last 168 hours.  Microbiology: Results for orders placed or performed during the hospital encounter of 11/14/12  Surgical pcr screen     Status: Abnormal   Collection Time: 11/14/12  5:28 PM  Result Value Ref Range Status   MRSA, PCR NEGATIVE NEGATIVE Final   Staphylococcus aureus POSITIVE (A) NEGATIVE Final    Comment:        The Xpert SA Assay (FDA approved for NASAL specimens in patients over 57 years of age), is one component of a comprehensive surveillance program.  Test performance has been validated by [employer] for patients greater than or equal to 50 year old. It is not intended to diagnose infection nor to guide or monitor treatment. RESULT CALLED TO, READ BACK BY AND VERIFIED WITH: RN S. TERVITTE 11/14/12 2110 KERANM    Coagulation Studies: No results for input(s): LABPROT, INR in the last 72 hours.  Urinalysis:  Recent Labs Lab 03/05/17 1540 03/06/17 1000  COLORURINE YELLOW* STRAW*  LABSPEC 1.008 1.010  PHURINE 7.0 7.0  GLUCOSEU NEGATIVE NEGATIVE  HGBUR NEGATIVE NEGATIVE  BILIRUBINUR NEGATIVE NEGATIVE  KETONESUR NEGATIVE NEGATIVE  PROTEINUR NEGATIVE NEGATIVE  NITRITE NEGATIVE NEGATIVE  LEUKOCYTESUR NEGATIVE NEGATIVE    Lipid Panel:     Component Value Date/Time   CHOL 130 01/22/2014 0434   TRIG 60 01/22/2014 0434   HDL 42 01/22/2014 0434    VLDL 12 01/22/2014 0434   LDLCALC 76 01/22/2014 0434    HgbA1C:  Lab Results  Component Value Date   HGBA1C 5.7 (H) 11/14/2012    Urine Drug Screen:  No results found for: LABOPIA, COCAINSCRNUR, LABBENZ, AMPHETMU, THCU, LABBARB  Alcohol Level: No results for input(s): ETH in the last 168 hours.  Other results: EKG: sinus rhythm at 66 bpm.  Imaging: Mr Brain Wo Contrast  Result Date: 03/06/2017 CLINICAL DATA:  83 year old male with confusion an  declining mental status. Hyponatremia. Decreased p.o. intake. Recent fall. EXAM: MRI HEAD WITHOUT CONTRAST TECHNIQUE: Multiplanar, multiecho pulse sequences of the brain and surrounding structures were obtained without intravenous contrast. COMPARISON:  Head CT without contrast 12/06/2016. Brain MRI 01/21/2014 FINDINGS: Brain: Mild generalized cerebral volume loss since 2015. No restricted diffusion to suggest acute infarction. No midline shift, mass effect, evidence of mass lesion, ventriculomegaly, extra-axial collection or acute intracranial hemorrhage. Cervicomedullary junction and pituitary are within normal limits. Chronic posterior inferior right cerebellar infarct is unchanged since 2015. Patchy T2 heterogeneity in the pons is only mildly progressed since 2015. T2 heterogeneity in the bilateral deep gray matter nuclei -mostly the basal ganglia- is mildly progressed. Similar mild progression of chronic Patchy and confluent right greater than left cerebral white matter T2 and FLAIR hyperintensity. No cerebral cortical encephalomalacia or chronic cerebral blood products identified. Vascular: Major intracranial vascular flow voids are preserved. Skull and upper cervical spine: Negative. Normal bone marrow signal. Sinuses/Orbits: Stable and negative. Other: Visible internal auditory structures remain normal. Negative scalp soft tissues. IMPRESSION: 1.  No acute intracranial abnormality. 2. Chronic small and medium-sized vessel ischemia with mild  progression since 2015. Electronically Signed   By: Odessa Fleming M.D.   On: 03/06/2017 16:02   Ct Abdomen Pelvis W Contrast  Result Date: 03/06/2017 CLINICAL DATA:  Inpatient. Weight loss (36 pounds over the prior year). Failure to thrive. EXAM: CT ABDOMEN AND PELVIS WITH CONTRAST TECHNIQUE: Multidetector CT imaging of the abdomen and pelvis was performed using the standard protocol following bolus administration of intravenous contrast. CONTRAST:  ISOVUE-300 IOPAMIDOL (ISOVUE-300) INJECTION 61% COMPARISON:  07/27/2005 CT abdomen/ pelvis. FINDINGS: Motion degraded scan. Lower chest: Nonspecific patchy subpleural reticulation and parenchymal banding at the lung bases. Visualized lower sternotomy wire appears intact. Hepatobiliary: Normal liver with no liver mass. Normal gallbladder with no radiopaque cholelithiasis. No biliary ductal dilatation. Pancreas: Normal, with no mass or duct dilation. Spleen: Normal size. No mass. Adrenals/Urinary Tract: Normal adrenals. No hydronephrosis. No renal masses. Mild diffuse bladder wall thickening. Stomach/Bowel: Grossly normal stomach. Nonspecific mild circumferential wall thickening in the descending duodenum (series 7/image 11). Normal caliber small bowel with no small bowel wall thickening. Normal appendix. Mild diffuse colonic diverticulosis, with no large bowel wall thickening or pericolonic fat stranding. Vascular/Lymphatic: Atherosclerotic nonaneurysmal abdominal aorta. Patent portal, splenic and renal veins. No pathologically enlarged lymph nodes in the abdomen or pelvis. Reproductive: Mildly enlarged prostate. Other: No pneumoperitoneum, ascites or focal fluid collection. Possible small fat containing bilateral inguinal hernias. Musculoskeletal: No aggressive appearing focal osseous lesions. Bilateral L5 pars defects with prominent 19 mm anterolisthesis and severe degenerative disc disease at L5-S1. Severe thoracolumbar spondylosis. IMPRESSION: 1. No  lymphadenopathy or findings of metastatic disease in the abdomen or pelvis. 2. Nonspecific mild circumferential wall thickening in the descending duodenum, cannot exclude duodenitis, peptic ulcer disease or neoplasm. Consider upper endoscopic correlation as clinically warranted. 3. Nonspecific mild diffuse bladder wall thickening, recommend correlation with urinalysis to exclude acute cystitis. 4. Additional findings include aortic atherosclerosis, mildly enlarged prostate and mild diffuse colonic diverticulosis. Electronically Signed   By: Delbert Phenix M.D.   On: 03/06/2017 19:11     Assessment/Plan: 83 year old male with altered mental status in the setting of preexisting cognitive deficits per wife.  Patient with a nonfocal neurological examination.  MRI of the brain reviewed and shows no acute changes.  Patient with multiple metabolic issues including hyponatremia and dehydration.  Anemic as well.  These are being addressed.  Altered mental status likely secondary to metabolic issues.    Recommendations: 1.  Agree with continued addressing of metabolic issues.  Would check TSH, B12 and B1.  No further neurologic intervention is recommended at this time.  If further questions arise, please call or page at that time.  Thank you for allowing neurology to participate in the care of this patient.   Thana Farr, MD Neurology [phone] 03/08/2017, 11:51 AM

## 2017-03-08 NOTE — Clinical Social Work Note (Addendum)
SNF has not received insurance authorization yet for patient to go to SNF, CSW updated physician and bedside nurse.  CSW to continue to follow patient's progress throughout discharge planning.  Bobby [name]. Bobby [name], MSW, Theresia Majors [phone]  03/08/2017 4:24 PM

## 2017-03-08 NOTE — Progress Notes (Signed)
Calorie Count Note  48 hour calorie count ordered. Day 2 results below:  Diet: Soft (had been changed to Heart Healthy yesterday) Supplements: Ensure Enlive po TID, each supplement provides 350 kcal and 20 grams of protein  Receipts from trays were not kept for calorie count. Discussed with patient's family to determine what he had to eat yesterday.  Breakfast 4/16: N/A (patient did not eat breakfast) Lunch 4/16: 467 kcal, 43 grams of protein Dinner 4/16: 384 kcal, 28 grams of protein Supplements: 350 kcal, 20 grams of protein (1 bottle Ensure Enlive)  Total intake: 1201 kcal (76% of minimum estimated needs)  91 grams of protein (100% of minimum estimated needs)  Estimated Nutritional Needs: Kcal:1585-1850 (MSJ x 1.2-1.4) Protein:80-95 grams (1.2-1.4 grams/kg) Fluid:1.7 L/day (25 ml/kg)  Nutrition Dx: Malnutrition (Severe)related to chronic illnessas evidenced by severe depletion of body fat, severe depletion of muscle mass.  Goal: Patient will meet greater than or equal to 90% of their needs  Intervention:  -Ensure Enlive po TID, each supplement provides 350 kcal and 20 grams of protein.   Helane Rima, MS, RD, LDN Pager: [phone] After Hours Pager: [phone]

## 2017-03-08 NOTE — Discharge Instructions (Signed)

## 2017-03-08 NOTE — Progress Notes (Signed)
Family in room, Bobby [name], noted a small oval yellow tablet with the markings "M P9" in the sink.  Medication is visualized, looked up, and identified as a pantoprozole tablet.    Unknown as to why it is in the sink or how long it has been there.  Last admin of pantoprozole was before this shift.  Question if tablet was dropped and someone tried to dispose of it in the sink?  Regardless, found tablet is disposed of.  Will bring to attention of oncoming day shift nurse to make sure patient is receiving this medication as scheduled.

## 2017-03-09 LAB — SODIUM: Sodium: 125 mmol/L — ABNORMAL LOW (ref 135–145)

## 2017-03-09 LAB — SURGICAL PATHOLOGY

## 2017-03-09 LAB — VITAMIN B12: VITAMIN B 12: 284 pg/mL (ref 180–914)

## 2017-03-09 LAB — TSH: TSH: 3.227 u[IU]/mL (ref 0.350–4.500)

## 2017-03-09 MED ORDER — SUCRALFATE 1 GM/10ML PO SUSP: 1.0000 g | Freq: Three times a day (TID) | ORAL | 0 refills | Status: DC

## 2017-03-09 MED ORDER — ACETAMINOPHEN 325 MG PO TABS
650.0000 mg | ORAL_TABLET | Freq: Four times a day (QID) | ORAL | Status: DC | PRN
  Administered 2017-03-09: 13:00:00 650 mg via ORAL
  Filled 2017-03-09: qty 2

## 2017-03-09 NOTE — Discharge Summary (Addendum)
[facility] - [place] at [facility]   PATIENT NAME: Bobby [name]    MR#:  [id number]  DATE OF BIRTH:  03-21-1932  DATE OF ADMISSION:  03/04/2017 ADMITTING PHYSICIAN: Shaune Pollack, MD  DATE OF DISCHARGE: 03/11/2017  PRIMARY CARE PHYSICIAN: Danella Penton, MD    ADMISSION DIAGNOSIS:  Hyponatremia [E87.1] Abdominal pain, unspecified abdominal location [R10.9] Altered mental status, unspecified altered mental status type [R41.82]  DISCHARGE DIAGNOSIS:  Principal Problem:   Hyponatremia Active Problems:   Acute metabolic encephalopathy   SIADH (syndrome of inappropriate ADH production) (HCC)   Malnutrition (HCC)   Duodenal ulcer     SECONDARY DIAGNOSIS:   Past Medical History:  Diagnosis Date  . Arthritis    (R)TKR 5/13 [facility]  . Coronary artery disease    2 stents  . GERD (gastroesophageal reflux disease)   . Myocardial infarction (HCC)    NSTEMI 11/11/12 @ [facility]  . Peripheral vascular disease [facility])    (L)CEA 2007 [place]  . PONV (postoperative nausea and vomiting)   . Stroke [facility]) 2015    HOSPITAL COURSE:   83 year old male with a history of CVA and CAD who presented with generalized decline in health and poor by mouth intake with a sodium level of 124.  1.acute on chronic Hyponatremia: Initial thought that this was due to dehydration. After IV fluids were started sodium level increased appropriately then decrease. It was thought that patient may have component of SIADH. His sodium level has improved with 3% C[place]tine to encourage to eat and may drink up to 1600 cc fluid restriction.   2. Acute metabolic encephalopathy in the setting of hyponatremia and pre-existing cognitive deficits per wife Patient was evaluated by neurology Encephalopathy has improved  3. Adult failure to thrive with moderate protein calorie malnutrition due to poor by mouth intake due to nonbleeding duodenal ulcer. Patient will continue on nutritional supplements.  4.  Nonbleeding duodenal ulcer with no stigmata of bleeding seen on EGD on April 16. Patient will continue on PPI  Patient will follow up with GI in 2 weeks and will need repeat upper endoscopy in 6 weeks.   5. CAD: Continue aspirin, metoprolol and simvastatin Echo Study Conclusions  - Left ventricle: The cavity size was normal. Wall thickness was normal. Systolic function was normal. The estimated ejection fraction was in the range of 50% to 55%. Regional wall motion abnormalities cannot be excluded. Doppler parameters are consistent with abnormal left ventricular relaxation (grade 1 diastolic dysfunction). - Ventricular septum: Septal motion showed dyssynergy. These changes are consistent with a post-thoracotomy state. - Aortic valve: Cusp separation was mildly reduced. Sclerosis without stenosis. - Mitral valve: There was mild regurgitation. - Left atrium: The atrium was mildly dilated. - Right ventricle: The cavity size was normal. Systolic function was mildly reduced. - Right atrium: The atrium was mildly dilated  6. Right lumbar radiculopathy: Patient had second opinion from Neurosurgery. He needs referral to either Dr. Laban Emperor in pain management on an outpatient basis for consideration of injections. He should continue physical therapy. If back pain is problematic and activity-limiting, a Lumbar Spine Orthotic from [employer] could be useful. Patient not a candidate for surgery    DISCHARGE CONDITIONS AND DIET:   Stable for discharge   Soft diet  CONSULTS OBTAINED:  Treatment Team:  Alwyn Pea, MD Thana Farr, MD Mosetta Pigeon, MD Venetia Night, MD  DRUG ALLERGIES:   Allergies  Allergen Reactions  . Percocet [Oxycodone-Acetaminophen]     Hallucinations   .  Septra [Sulfamethoxazole-Trimethoprim]   . Tramadol Hcl Other (See Comments)    Hallucinations    DISCHARGE MEDICATIONS:   Current Discharge Medication List    START  taking these medications   Details  feeding supplement, ENSURE ENLIVE, (ENSURE ENLIVE) LIQD Take 237 mLs by mouth 3 (three) times daily between meals. Qty: 90 Bottle, Refills: 12    lidocaine-prilocaine (EMLA) cream Apply topically as needed (pain). Qty: 30 g, Refills: 0    pantoprazole (PROTONIX) 40 MG tablet Take 1 tablet (40 mg total) by mouth daily. Qty: 30 tablet, Refills: 3    sucralfate (CARAFATE) 1 GM/10ML suspension Take 10 mLs (1 g total) by mouth 4 (four) times daily -  with meals and at bedtime. Qty: 420 mL, Refills: 0      CONTINUE these medications which have NOT CHANGED   Details  Bee Pollen 1000 MG TABS Take 2,000 mg by mouth 2 (two) times daily.    Cholecalciferol (VITAMIN D3) 1000 units CAPS Take 1,000 Units by mouth daily.    ferrous sulfate 325 (65 FE) MG tablet Take 325 mg by mouth every Monday, Wednesday, and Friday.    gabapentin (NEURONTIN) 100 MG capsule Take 100 mg by mouth at bedtime.    metoprolol tartrate (LOPRESSOR) 25 MG tablet Take 0.5 tablets (12.5 mg total) by mouth 2 (two) times daily. Qty: 30 tablet, Refills: 1    Multiple Vitamins-Minerals (OCUVITE PRESERVISION PO) Take 1 tablet by mouth 2 (two) times daily.    simvastatin (ZOCOR) 20 MG tablet Take 10 mg by mouth at bedtime.    vitamin C (ASCORBIC ACID) 500 MG tablet Take 500 mg by mouth daily.    senna-docusate (SENOKOT-S) 8.6-50 MG per tablet Take 1 tablet by mouth daily as needed for constipation.      STOP taking these medications     aspirin 81 MG tablet      etodolac (LODINE) 400 MG tablet      omeprazole (PRILOSEC) 20 MG capsule           Today   CHIEF COMPLAINT:   Acute issues overnight   VITAL SIGNS:  Blood pressure (!) 105/43, pulse 82, temperature 98.6 F (37 C), temperature source Oral, resp. rate 20, height  (1.676 m), weight 69.9 kg (154 lb), SpO2 98 %.   REVIEW OF SYSTEMS:  Review of Systems  Constitutional: Negative.  Negative for chills, fever  and malaise/fatigue.  HENT: Negative.  Negative for ear discharge, ear pain, hearing loss, nosebleeds and sore throat.   Eyes: Negative.  Negative for blurred vision and pain.  Respiratory: Negative.  Negative for cough, hemoptysis, shortness of breath and wheezing.   Cardiovascular: Negative.  Negative for chest pain, palpitations and leg swelling.  Gastrointestinal: Negative.  Negative for abdominal pain, blood in stool, diarrhea, nausea and vomiting.  Genitourinary: Negative.  Negative for dysuria.       Appetite a bit better   Musculoskeletal: Negative.  Negative for back pain.  Skin: Negative.   Neurological: Negative for dizziness, tremors, speech change, focal weakness, seizures and headaches.  Endo/Heme/Allergies: Negative.  Does not bruise/bleed easily.  Psychiatric/Behavioral: Negative.  Negative for depression, hallucinations and suicidal ideas.     PHYSICAL EXAMINATION:  GENERAL:  83 y.o.-year-old patient lying in the bed with no acute distress.  NECK:  Supple, no jugular venous distention. No thyroid enlargement, no tenderness.  LUNGS: Normal breath sounds bilaterally, no wheezing, rales,rhonchi  No use of accessory muscles of respiration.  CARDIOVASCULAR: S1, S2 normal.  2/6 murmurs,NO rubs, or gallops.  ABDOMEN: Soft, non-tender, non-distended. Bowel sounds present. No organomegaly or mass.  EXTREMITIES: No pedal edema, cyanosis, or clubbing.  PSYCHIATRIC: The patient is alert and oriented x Name and place  SKIN: No obvious rash, lesion, or ulcer.   DATA REVIEW:   CBC  Recent Labs Lab 03/06/17 0655  HGB 9.9*    Chemistries   Recent Labs Lab 03/11/17 0242  03/11/17 1453  NA 128*  < > 130*  K 4.5  --   --   CL 98*  --   --   CO2 24  --   --   GLUCOSE 98  --   --   BUN 28*  --   --   CREATININE 1.04  --   --   CALCIUM 8.6*  --   --   < > = values in this interval not displayed.  Cardiac Enzymes  Recent Labs Lab 03/05/17 0915  TROPONINI <0.03     Microbiology Results  @  RADIOLOGY:  Dg Pelvis 1-2 Views  Result Date: 03/10/2017 CLINICAL DATA:  Pelvis pain today, greater on the right. EXAM: PELVIS - 1-2 VIEW COMPARISON:  Pelvis CT dated 03/06/2017. FINDINGS: Bilateral groins surgical clips. Diffuse osteopenia. Mild bilateral hip degenerative changes. Lower lumbar spine degenerative changes. Ingested oral contrast in the bowel. IMPRESSION: No acute abnormality. Electronically Signed   By: Beckie Salts M.D.   On: 03/10/2017 14:38      Current Discharge Medication List    START taking these medications   Details  feeding supplement, ENSURE ENLIVE, (ENSURE ENLIVE) LIQD Take 237 mLs by mouth 3 (three) times daily between meals. Qty: 90 Bottle, Refills: 12    lidocaine-prilocaine (EMLA) cream Apply topically as needed (pain). Qty: 30 g, Refills: 0    pantoprazole (PROTONIX) 40 MG tablet Take 1 tablet (40 mg total) by mouth daily. Qty: 30 tablet, Refills: 3    sucralfate (CARAFATE) 1 GM/10ML suspension Take 10 mLs (1 g total) by mouth 4 (four) times daily -  with meals and at bedtime. Qty: 420 mL, Refills: 0      CONTINUE these medications which have NOT CHANGED   Details  Bee Pollen 1000 MG TABS Take 2,000 mg by mouth 2 (two) times daily.    Cholecalciferol (VITAMIN D3) 1000 units CAPS Take 1,000 Units by mouth daily.    ferrous sulfate 325 (65 FE) MG tablet Take 325 mg by mouth every Monday, Wednesday, and Friday.    gabapentin (NEURONTIN) 100 MG capsule Take 100 mg by mouth at bedtime.    metoprolol tartrate (LOPRESSOR) 25 MG tablet Take 0.5 tablets (12.5 mg total) by mouth 2 (two) times daily. Qty: 30 tablet, Refills: 1    Multiple Vitamins-Minerals (OCUVITE PRESERVISION PO) Take 1 tablet by mouth 2 (two) times daily.    simvastatin (ZOCOR) 20 MG tablet Take 10 mg by mouth at bedtime.    vitamin C (ASCORBIC ACID) 500 MG tablet Take 500 mg by mouth daily.    senna-docusate (SENOKOT-S) 8.6-50 MG per  tablet Take 1 tablet by mouth daily as needed for constipation.      STOP taking these medications     aspirin 81 MG tablet      etodolac (LODINE) 400 MG tablet      omeprazole (PRILOSEC) 20 MG capsule            Management plans discussed with the patient and wife and they are in agreement. Stable for discharge SNF  Patient should follow up with pcp and GI and nephrology  CODE STATUS:     Code Status Orders        Start     Ordered   03/04/17 1227  Do not attempt resuscitation (DNR)  Continuous    Question Answer Comment  In the event of cardiac or respiratory ARREST Do not call a "code blue"   In the event of cardiac or respiratory ARREST Do not perform Intubation, CPR, defibrillation or ACLS   In the event of cardiac or respiratory ARREST Use medication by any route, position, wound care, and other measures to relive pain and suffering. May use oxygen, suction and manual treatment of airway obstruction as needed for comfort.      03/04/17 1226    Code Status History    Date Active Date Inactive Code Status Order ID Comments User Context   12/06/2016  4:21 PM 12/09/2016  5:45 PM Full Code [id number]  Auburn Bilberry, MD Inpatient   11/14/2012  5:37 PM 11/16/2012  1:56 PM Full Code [id number]  Ardelle Balls, PA Inpatient    Advance Directive Documentation     Most Recent Value  Type of Advance Directive  Healthcare Power of Attorney, Living will  Pre-existing out of facility DNR order (yellow form or pink MOST form)  -  "MOST" Form in Place?  -      TOTAL TIME TAKING CARE OF THIS PATIENT: 40 minutes.    Note: This dictation was prepared with Dragon dictation along with smaller phrase technology. Any transcriptional errors that result from this process are unintentional.  Jossilyn Benda M.D on 03/11/2017 at 5:45 PM  Between 7am to 6pm - Pager - [phone] After 6pm go to [email or web address] - Social research officer, government  [facility]  Office   [phone]  CC: Primary care physician; Danella Penton, MD

## 2017-03-09 NOTE — Clinical Social Work Note (Signed)
Bobby [name] is still pending. Per facility, the case manager is reviewing and potentially be under review of the medical director. Family is aware. CSW spoke with son and daughter in law, provided information of Medicaid for LTC options if needed. CSW will continue to follow.   Dede Query, MSW, LCSW Clinical Social Worker  [phone]

## 2017-03-10 ENCOUNTER — Inpatient Hospital Stay: Payer: Medicare HMO

## 2017-03-10 LAB — BASIC METABOLIC PANEL
ANION GAP: 7 (ref 5–15)
BUN: 28 mg/dL — ABNORMAL HIGH (ref 6–20)
CALCIUM: 8.8 mg/dL — AB (ref 8.9–10.3)
CO2: 24 mmol/L (ref 22–32)
Chloride: 94 mmol/L — ABNORMAL LOW (ref 101–111)
Creatinine, Ser: 1.03 mg/dL (ref 0.61–1.24)
Glucose, Bld: 98 mg/dL (ref 65–99)
Potassium: 4.5 mmol/L (ref 3.5–5.1)
SODIUM: 125 mmol/L — AB (ref 135–145)

## 2017-03-10 LAB — SODIUM: Sodium: 124 mmol/L — ABNORMAL LOW (ref 135–145)

## 2017-03-10 LAB — SODIUM, URINE, RANDOM: Sodium, Ur: 61 mmol/L

## 2017-03-10 LAB — OSMOLALITY, URINE: Osmolality, Ur: 424 mOsm/kg (ref 300–900)

## 2017-03-10 LAB — OSMOLALITY: OSMOLALITY: 265 mosm/kg — AB (ref 275–295)

## 2017-03-10 MED ORDER — SODIUM CHLORIDE 3 % IV SOLN
INTRAVENOUS | Status: DC
  Administered 2017-03-10 – 2017-03-11 (×2): 30 mL/h via INTRAVENOUS
  Filled 2017-03-10 (×5): qty 500

## 2017-03-10 NOTE — Clinical Social Work Note (Signed)
[employer] will denial initial request for STR, and has requested a peer to peer. Updated MD, who will do peer to peer. RNCM is also aware. CSW will continue to follow.   Dede Query, MSW, LCSW Clinical Social Worker  [phone]

## 2017-03-10 NOTE — Care Management (Addendum)
Spoke with patient's family who are present in the room.  Awaiting auth from insurance for skilled nursing.  Informed by Georgana Curio that it appears there will need to be a peer to peer performs as it looks as though [facility] is going to decline to [place] for skilled nursing.  Dr Benjie Karvonen is willing to perform peer to peer. Family has many questions and concerns.  Patient is somnolent  which is not his baseline.  he is falling asleep while chewing his breakfast.  They verbalize concerns that patient's sodium is still low at 125. The highest patient's sodium has been since jan 2018 is 128.  Patient is currently on fluid restriction for this and a nephrology consult was discussed but since an anticipated discharge was discussed, an appointment was made as an outpatient. CM discussed discharge home with home health and informed that this was not an option because patient's care needs can not be met.  Family also stated that 'there is no room for walkers in the small four room house".  Discussed the option of proceeding to a skilled nursing facility under private pay and applying for medicaid LTC. Informed this also was not an option.  Family is very upset with the [employer] plan that they signed up for when contacted by an insurance agent during open enrollment  in fall 2017. [place] will follow up with CSW regarding appeal process. Discussed patient/family concerns with Dr Benjie Karvonen

## 2017-03-10 NOTE — Progress Notes (Signed)
[facility] - [place] at [facility]   PATIENT NAME: Bobby [name]    MR#:  [id number]  DATE OF BIRTH:  17-May-1932  SUBJECTIVE:   doing well no nausea or SOB  REVIEW OF SYSTEMS:    Review of Systems  Constitutional: Negative for fever, chills weight loss HENT: Negative for ear pain, nosebleeds, congestion, facial swelling, rhinorrhea, neck pain, neck stiffness and ear discharge.   Respiratory: Negative for cough, shortness of breath, wheezing  Cardiovascular: Negative for chest pain, palpitations and leg swelling.  Gastrointestinal: Negative for heartburn, abdominal pain, vomiting, diarrhea or consitpation Genitourinary: Negative for dysuria, urgency, frequency, hematuria Musculoskeletal: Negative for back pain or joint pain Neurological: Negative for dizziness, seizures, syncope, focal weakness,  numbness and headaches.  Hematological: Does not bruise/bleed easily.  Psychiatric/Behavioral: Negative for hallucinations, confusion, dysphoric mood    Tolerating Diet: yes      DRUG ALLERGIES:   Allergies  Allergen Reactions  . Percocet [Oxycodone-Acetaminophen]     Hallucinations   . Septra [Sulfamethoxazole-Trimethoprim]   . Tramadol Hcl Other (See Comments)    Hallucinations    VITALS:  Blood pressure (!) 150/71, pulse 70, temperature 98.6 F (37 C), temperature source Oral, resp. rate 18, height  (1.676 m), weight 69.9 kg (154 lb), SpO2 98 %.  PHYSICAL EXAMINATION:  Constitutional: Appears well-developed and well-nourished. No distress. HENT: Normocephalic. Marland Kitchen Oropharynx is clear and moist.  Eyes: Conjunctivae and EOM are normal. PERRLA, no scleral icterus.  Neck: Normal ROM. Neck supple. No JVD. No tracheal deviation. CVS: RRR, S1/S2 +, 2/6 murmur, no gallops, no carotid bruit.  Pulmonary: Effort and breath sounds normal, no stridor, rhonchi, wheezes, rales.  Abdominal: Soft. BS +,  no distension, tenderness, rebound or guarding.   Musculoskeletal: Normal range of motion. No edema and no tenderness.  Neuro: Alert. CN 2-12 grossly intact. No focal deficits. Skin: Skin is warm and dry. No rash noted. Psychiatric: Normal mood and affect.      LABORATORY PANEL:   CBC  Recent Labs Lab 03/04/17 0906 03/06/17 0655  WBC 7.5  --   HGB 10.4* 9.9*  HCT 29.4*  --   PLT 204  --    ------------------------------------------------------------------------------------------------------------------  Chemistries   Recent Labs Lab 03/04/17 0906  03/10/17 0357  NA 124*  < > 125*  K 4.5  < > 4.5  CL 95*  < > 94*  CO2 23  < > 24  GLUCOSE 96  < > 98  BUN 26*  < > 28*  CREATININE 1.12  < > 1.03  CALCIUM 9.0  < > 8.8*  MG 1.8  --   --   < > = values in this interval not displayed. ------------------------------------------------------------------------------------------------------------------  Cardiac Enzymes  Recent Labs Lab 03/04/17 0906 03/05/17 0915  TROPONINI <0.03 <0.03   ------------------------------------------------------------------------------------------------------------------  RADIOLOGY:  No results found.   ASSESSMENT AND PLAN:   83 year old male with a history of CVA and CAD who presented with generalized decline in health and poor by mouth intake with a sodium level of 124.  1. Hyponatremia: Initial thought that this was due to dehydration. After IV fluids were started sodium level increased appropriately then decrease. It was thought that patient may have component of SIADH. His sodium level has stayed stable at 125 He will have outpatient follow-up with nephrology. For now goal should be 1500 cc fluid restriction PPIs can cause hyponatremia and SIADH and he is on this for ulcers seen on EGD during this hospital stay.  Therefore sodium level should be monitored very frequently.  2. Acute metabolic encephalopathy in the setting of hyponatremia and pre-existing cognitive deficits  per wife Patient was evaluated by neurology Encephalopathy has improved  3. Adult failure to thrive with moderate protein calorie malnutrition due to poor by mouth intake due to nonbleeding duodenal ulcer. Patient will continue on nutritional supplements.  4. Nonbleeding duodenal ulcer with no stigmata of bleeding seen on EGD on April 16. Patient will continue on PPI and started on Carafate. Patient will follow up with GI in 2 weeks and will need repeat upper endoscopy in 6 weeks. I've.   5. CAD: Continue aspirin, metoprolol and simvastatin Echo Study Conclusions  - Left ventricle: The cavity size was normal. Wall thickness was normal. Systolic function was normal. The estimated ejection fraction was in the range of 50% to 55%. Regional wall motion abnormalities cannot be excluded. Doppler parameters are consistent with abnormal left ventricular relaxation (grade 1 diastolic dysfunction). - Ventricular septum: Septal motion showed dyssynergy. These changes are consistent with a post-thoracotomy state. - Aortic valve: Cusp separation was mildly reduced. Sclerosis without stenosis. - Mitral valve: There was mild regurgitation. - Left atrium: The atrium was mildly dilated. - Right ventricle: The cavity size was normal. Systolic function was mildly reduced. - Right atrium: The atrium was mildly dilated      Management plans discussed with the patient and he is in agreement.  CODE STATUS: dnr  TOTAL TIME TAKING CARE OF THIS PATIENT: 26 minutes.     POSSIBLE D/C today, DEPENDING ON PASSR  Janeann Paisley M.D on 03/10/2017 at 6:22 AM  Between 7am to 6pm - Pager - [phone] After 6pm go to [email or web address] - password [email or web address]  [facility] [place]  Office  [phone]  CC: Primary care physician; Danella Penton, MD  Note: This dictation was prepared with Dragon dictation along with smaller phrase technology. Any transcriptional errors that  result from this process are unintentional.

## 2017-03-10 NOTE — Progress Notes (Addendum)
[employer] - [place] at [facility]   PATIENT NAME: Bobby [name]    MR#:  [id number]  DATE OF BIRTH:  Dec 31, 1931  SUBJECTIVE:    No acute issues overnight  It seems that perhaps Monia Pouch has denied SNF Family upset about SNF discharge Wants second opinion from neurosurgery   REVIEW OF SYSTEMS:    Review of Systems  Constitutional: Negative for fever, chills weight loss HENT: Negative for ear pain, nosebleeds, congestion, facial swelling, rhinorrhea, neck pain, neck stiffness and ear discharge.   Respiratory: Negative for cough, shortness of breath, wheezing  Cardiovascular: Negative for chest pain, palpitations and leg swelling.  Gastrointestinal: Negative for heartburn, abdominal pain, vomiting, diarrhea or consitpation Genitourinary: Negative for dysuria, urgency, frequency, hematuria Musculoskeletal: Negative for back pain or joint pain Neurological: Negative for dizziness, seizures, syncope, focal weakness,  numbness and headaches.  ++Radiculopathy right leg  Hematological: Does not bruise/bleed easily.  Psychiatric/Behavioral: Negative for hallucinations, confusion, dysphoric mood    Tolerating Diet: yes      DRUG ALLERGIES:   Allergies  Allergen Reactions  . Percocet [Oxycodone-Acetaminophen]     Hallucinations   . Septra [Sulfamethoxazole-Trimethoprim]   . Tramadol Hcl Other (See Comments)    Hallucinations    VITALS:  Blood pressure (!) 150/71, pulse 70, temperature 98.6 F (37 C), temperature source Oral, resp. rate 18, height  (1.676 m), weight 69.9 kg (154 lb), SpO2 98 %.  PHYSICAL EXAMINATION:  Constitutional: Appears well-developed and well-nourished. No distress. HENT: Normocephalic. Marland Kitchen Oropharynx is clear and moist.  Eyes: Conjunctivae and EOM are normal. PERRLA, no scleral icterus.  Neck: Normal ROM. Neck supple. No JVD. No tracheal deviation. CVS: RRR, S1/S2 +, 2/6 murmur, no gallops, no carotid bruit.  Pulmonary: Effort  and breath sounds normal, no stridor, rhonchi, wheezes, rales.  Abdominal: Soft. BS +,  no distension, tenderness, rebound or guarding.  Musculoskeletal: Normal range of motion. No edema and no tenderness.  Neuro: Alert. CN 2-12 grossly intact. No focal deficits. Skin: Skin is warm and dry. No rash noted. Psychiatric: Normal mood and affect.      LABORATORY PANEL:   CBC  Recent Labs Lab 03/04/17 0906 03/06/17 0655  WBC 7.5  --   HGB 10.4* 9.9*  HCT 29.4*  --   PLT 204  --    ------------------------------------------------------------------------------------------------------------------  Chemistries   Recent Labs Lab 03/04/17 0906  03/10/17 0357  NA 124*  < > 125*  K 4.5  < > 4.5  CL 95*  < > 94*  CO2 23  < > 24  GLUCOSE 96  < > 98  BUN 26*  < > 28*  CREATININE 1.12  < > 1.03  CALCIUM 9.0  < > 8.8*  MG 1.8  --   --   < > = values in this interval not displayed. ------------------------------------------------------------------------------------------------------------------  Cardiac Enzymes  Recent Labs Lab 03/04/17 0906 03/05/17 0915  TROPONINI <0.03 <0.03   ------------------------------------------------------------------------------------------------------------------  RADIOLOGY:  No results found.   ASSESSMENT AND PLAN:   83 year old male with a history of CVA and CAD who presented with generalized decline in health and poor by mouth intake with a sodium level of 124.  1. Hyponatremia: Initial thought that this was due to dehydration. After IV fluids were started sodium level increased appropriately then decrease. It was thought that patient may have component of SIADH. His sodium level has stayed stable at 125 I wil consult Nephrology since patient is not being discharged. For now goal  should be 1500 cc fluid restriction PPIs can cause hyponatremia and SIADH and he is on this for ulcers seen on EGD during this hospital stay. Therefore sodium  level should be monitored very frequently.  2. Acute metabolic encephalopathy in the setting of hyponatremia and pre-existing cognitive deficits per wife Patient was evaluated by neurology Encephalopathy has improved  3. Adult failure to thrive with moderate protein calorie malnutrition due to poor by mouth intake due to nonbleeding duodenal ulcer. Patient will continue on nutritional supplements.  4. Nonbleeding duodenal ulcer with no stigmata of bleeding seen on EGD on April 16. Patient will continue on PPI  Patient will follow up with GI in 2 weeks and will need repeat upper endoscopy in 6 weeks.   5. CAD: Continue aspirin, metoprolol and simvastatin Echo Study Conclusions  - Left ventricle: The cavity size was normal. Wall thickness was normal. Systolic function was normal. The estimated ejection fraction was in the range of 50% to 55%. Regional wall motion abnormalities cannot be excluded. Doppler parameters are consistent with abnormal left ventricular relaxation (grade 1 diastolic dysfunction). - Ventricular septum: Septal motion showed dyssynergy. These changes are consistent with a post-thoracotomy state. - Aortic valve: Cusp separation was mildly reduced. Sclerosis without stenosis. - Mitral valve: There was mild regurgitation. - Left atrium: The atrium was mildly dilated. - Right ventricle: The cavity size was normal. Systolic function was mildly reduced. - Right atrium: The atrium was mildly dilated  6. Right lumbar radiculopathy: Family would like second opinion from NEUROSURGERY I will order XRAY pelvs, family concerned there may be occult fracture as patient at times having pain. Patient very weak and would benefit from SNF not a safe discharge home at all High risk of coming back to hopsital.   I have called [phone] case [id number] and left message    Management plans discussed with the patient and he is in agreement.  CODE STATUS:  dnr  TOTAL TIME TAKING CARE OF THIS PATIENT: 26 minutes.     POSSIBLE D/C today, DEPENDING ON PASSR  Der Gagliano M.D on 03/10/2017 at 10:45 AM  Between 7am to 6pm - Pager - [phone] After 6pm go to [email or web address] - password [email or web address]  [facility]  Office  [phone]  CC: Primary care physician; Danella Penton, MD  Note: This dictation was prepared with Dragon dictation along with smaller phrase technology. Any transcriptional errors that result from this process are unintentional.

## 2017-03-10 NOTE — Consult Note (Signed)
Date: 03/10/2017                  Patient Name:  Bobby [name]  MRN: [id number]  DOB: [birth year]  Age / Sex: 83 y.o., male         PCP: Danella Penton, MD                 Service Requesting Consult: Internal medicine/Dr. Tildon Husky                 Reason for Consult: hyponatremia            History of Present Illness: Patient is a 83 y.o. male with medical problems of coronary disease, hypertension, CABG in 2013, carotid endarterectomy, recent admissions for hyponatremia, who was admitted to [facility] on 03/04/2017 for evaluation of indigestion and right leg cramping. He was found to have hyponatremia and was admitted for further evaluation and management During this hospitalization, patient underwent upper endoscopy and was found to have a non bleeding crater duodenal ulcer. He is being treated with PPI. There are some concerns about PPI contributing to hyponatremia.  However, patient's daughter in law, who is a Designer, jewellery, states that patient has been on omeprazole chronically for at least 2-3 years. He was given IV normal saline without improvement in sodium levels. Nephrology consult has now been requested for evaluation. Hyponatremia has been noticed since at least January when sodium level was 120 In 2015, sodium elevated between 130 to 134  Patient has had very poor appetite.  He does not eat much during the day at all.  He does not have any lower extremity swelling.  Medications: Outpatient medications: Prescriptions Prior to Admission  Medication Sig Dispense Refill Last Dose  . aspirin 81 MG tablet Take 81 mg by mouth daily. For pain   03/03/2017 at 0800  . Bee Pollen 1000 MG TABS Take 2,000 mg by mouth 2 (two) times daily.   Past Week at 0800  . Cholecalciferol (VITAMIN D3) 1000 units CAPS Take 1,000 Units by mouth daily.   Past Week at 0800  . etodolac (LODINE) 400 MG tablet Take 400 mg by mouth 2 (two) times daily.   03/04/2017 at 1000  . ferrous sulfate 325 (65 FE) MG tablet  Take 325 mg by mouth every Monday, Wednesday, and Friday.   Past Week at 0700  . gabapentin (NEURONTIN) 100 MG capsule Take 100 mg by mouth at bedtime.   03/03/2017 at 2000  . metoprolol tartrate (LOPRESSOR) 25 MG tablet Take 0.5 tablets (12.5 mg total) by mouth 2 (two) times daily. 30 tablet 1 03/03/2017 at 0800  . Multiple Vitamins-Minerals (OCUVITE PRESERVISION PO) Take 1 tablet by mouth 2 (two) times daily.   03/03/2017 at 0700  . omeprazole (PRILOSEC) 20 MG capsule Take 20 mg by mouth daily.    03/07/2017 at 0700  . simvastatin (ZOCOR) 20 MG tablet Take 10 mg by mouth at bedtime.   03/03/2017 at 2000  . vitamin C (ASCORBIC ACID) 500 MG tablet Take 500 mg by mouth daily.   Past Week at 0800  . senna-docusate (SENOKOT-S) 8.6-50 MG per tablet Take 1 tablet by mouth daily as needed for constipation.   prn at prn    Current medications: Current Facility-Administered Medications  Medication Dose Route Frequency Provider Last Rate Last Dose  . acetaminophen (TYLENOL) tablet 650 mg  650 mg Oral Q6H PRN Adrian Saran, MD   650 mg at 03/09/17 1303  . albuterol (PROVENTIL) (  2.5 MG/3ML) 0.083% nebulizer solution 2.5 mg  2.5 mg Nebulization Q2H PRN Shaune Pollack, MD      . alum & mag hydroxide-simeth (MAALOX/MYLANTA) 200-200-20 MG/5ML suspension 30 mL  30 mL Oral Q6H PRN Shaune Pollack, MD      . aspirin chewable tablet 81 mg  81 mg Oral Daily Shaune Pollack, MD   81 mg at 03/10/17 1006  . cholecalciferol (VITAMIN D) tablet 1,000 Units  1,000 Units Oral Daily Shaune Pollack, MD   1,000 Units at 03/10/17 1006  . enoxaparin (LOVENOX) injection 40 mg  40 mg Subcutaneous Q24H Shaune Pollack, MD   40 mg at 03/09/17 2000  . feeding supplement (ENSURE ENLIVE) (ENSURE ENLIVE) liquid 237 mL  237 mL Oral BID BM Katharina Caper, MD   237 mL at 03/10/17 1340  . ferrous sulfate tablet 325 mg  325 mg Oral Q M,W,F Shaune Pollack, MD   325 mg at 03/09/17 0955  . gabapentin (NEURONTIN) capsule 100 mg  100 mg Oral QHS Shaune Pollack, MD   100 mg at 03/09/17 2115   . hydrALAZINE (APRESOLINE) injection 10 mg  10 mg Intravenous Q6H PRN Shaune Pollack, MD      . lidocaine-prilocaine (EMLA) cream   Topical PRN Katharina Caper, MD      . metoprolol tartrate (LOPRESSOR) tablet 12.5 mg  12.5 mg Oral BID Shaune Pollack, MD   12.5 mg at 03/10/17 1005  . ondansetron (ZOFRAN) tablet 4 mg  4 mg Oral Q6H PRN Shaune Pollack, MD       Or  . ondansetron [facility]) injection 4 mg  4 mg Intravenous Q6H PRN Shaune Pollack, MD      . pantoprazole (PROTONIX) EC tablet 40 mg  40 mg Oral BID AC Katharina Caper, MD   40 mg at 03/10/17 1005  . senna-docusate (Senokot-S) tablet 1 tablet  1 tablet Oral QHS PRN Shaune Pollack, MD      . simvastatin (ZOCOR) tablet 10 mg  10 mg Oral QHS Shaune Pollack, MD   10 mg at 03/09/17 2115  . sodium chloride (hypertonic) 3 % solution   Intravenous Continuous Paarth Cropper, MD 30 mL/hr at 03/10/17 1443 30 mL/hr at 03/10/17 1443  . vitamin C (ASCORBIC ACID) tablet 500 mg  500 mg Oral Daily Shaune Pollack, MD   500 mg at 03/10/17 1006      Allergies: Allergies  Allergen Reactions  . Percocet [Oxycodone-Acetaminophen]     Hallucinations   . Septra [Sulfamethoxazole-Trimethoprim]   . Tramadol Hcl Other (See Comments)    Hallucinations      Past Medical History: Past Medical History:  Diagnosis Date  . Arthritis    (R)TKR 5/13 [facility]  . Coronary artery disease    2 stents  . GERD (gastroesophageal reflux disease)   . Myocardial infarction (HCC)    NSTEMI 11/11/12 @ [facility]  . Peripheral vascular disease [facility])    (L)CEA 2007 [facility]  . PONV (postoperative nausea and vomiting)   . Stroke [facility]) 2015     Past Surgical History: Past Surgical History:  Procedure Laterality Date  . CARDIAC CATHETERIZATION     11/11/12 [facility],2000,2006 w/ stents  . CATARACT EXTRACTION    . CORONARY ARTERY BYPASS GRAFT  11/16/2012   Procedure: CORONARY ARTERY BYPASS GRAFTING (CABG);  Surgeon: Kerin Perna, MD;  Location: [facility] OR;  Service: Open Heart Surgery;  Laterality: N/A;  times four  using Bilateral Greater Saphenous Vein Graft harvested endoscopically from bilateral thighs, and Left Internal  Mammary Artery.  . ESOPHAGOGASTRODUODENOSCOPY (EGD) WITH PROPOFOL N/A 03/07/2017   Procedure: ESOPHAGOGASTRODUODENOSCOPY (EGD) WITH PROPOFOL;  Surgeon: Wyline Mood, MD;  Location: [facility] ENDOSCOPY;  Service: Endoscopy;  Laterality: N/A;  . HERNIA REPAIR  01/17/13   Dr Renda Rolls @ [facility]  . INTRAOPERATIVE TRANSESOPHAGEAL ECHOCARDIOGRAM  11/16/2012   Procedure: INTRAOPERATIVE TRANSESOPHAGEAL ECHOCARDIOGRAM;  Surgeon: Kerin Perna, MD;  Location: [facility] OR;  Service: Open Heart Surgery;  Laterality: N/A;  . JOINT REPLACEMENT     (R)TKR 5/13 [facility]  . VASCULAR SURGERY     (R)TKR 5/13 [facility]     Family History: History reviewed. No pertinent family history.   Social History: Social History   Social History  . Marital status: Married    Spouse name: N/A  . Number of children: N/A  . Years of education: N/A   Occupational History  . Not on file.   Social History Main Topics  . Smoking status: Former Smoker    Types: Cigars    Quit date: 10/22/1989  . Smokeless tobacco: Never Used  . Alcohol use No  . Drug use: No  . Sexual activity: Not on file   Other Topics Concern  . Not on file   Social History Narrative  . No narrative on file     Review of Systems: Limited Gen: no fevers or chills. HEENT: number vision or hearing problems reported CV: no shortness of breath or chest pain Resp: no cough or sputum [id number] is very poor GU : no problems with voiding reported MS: back pain radiating to the right leg Derm:  no problems reported Psych:no complaints Heme: no complaints Neuro: no complaints Endocrine no complaints  Vital Signs: Blood pressure (!) 109/49, pulse 77, temperature 98.3 F (36.8 C), temperature source Oral, resp. rate 16, height  (1.676 m), weight 69.9 kg (154 lb), SpO2 100 %.   Intake/Output Summary (Last 24 hours) at 03/10/17 1506 Last  data filed at 03/09/17 1850  Gross per 24 hour  Intake              240 ml  Output                0 ml  Net              240 ml    Weight trends: Filed Weights   03/04/17 1243 03/04/17 1624 03/08/17 0500  Weight: 76.2 kg (168 lb) 67.1 kg (148 lb) 69.9 kg (154 lb)    Physical Exam: General:  elderly gentleman, laying in the bed  HEENT Anicteric, moist oral mucous membranes  Neck:  supple, no JVD  Lungs: Normal breathing effort, clear to auscultation  Heart::  regular rhythm, no rub or gallop  Abdomen: Soft, nontender, nondistended  Extremities:  no edema  Neurologic: Alert, able to follow commands  Skin: Normal turgor, no acute rashes             Lab results: Basic Metabolic Panel:  Recent Labs Lab 03/04/17 0906  03/06/17 0655 03/07/17 0811 03/08/17 0404 03/09/17 0407 03/10/17 0357  NA 124*  < > 126* 125* 125* 125* 125*  K 4.5  < > 4.7  --  4.7  --  4.5  CL 95*  < > 99*  --  96*  --  94*  CO2 23  < > 19* 23 22  --  24  GLUCOSE 96  < > 84  --  94  --  98  BUN 26*  < >  23*  --  27*  --  28*  CREATININE 1.12  < > 0.98  --  0.87  --  1.03  CALCIUM 9.0  < > 8.5*  --  8.7*  --  8.8*  MG 1.8  --   --   --   --   --   --   < > = values in this interval not displayed.  Liver Function Tests: No results for input(s): AST, ALT, ALKPHOS, BILITOT, PROT, ALBUMIN in the last 168 hours. No results for input(s): LIPASE, AMYLASE in the last 168 hours. No results for input(s): AMMONIA in the last 168 hours.  CBC:  Recent Labs Lab 03/04/17 0906 03/06/17 0655  WBC 7.5  --   NEUTROABS 5.5  --   HGB 10.4* 9.9*  HCT 29.4*  --   MCV 94.9  --   PLT 204  --     Cardiac Enzymes:  Recent Labs Lab 03/05/17 0915  TROPONINI <0.03    BNP: Invalid input(s): POCBNP  CBG: No results for input(s): GLUCAP in the last 168 hours.  Microbiology: No results found for this or any previous visit (from the past 720 hour(s)).   Coagulation Studies: No results for input(s):  LABPROT, INR in the last 72 hours.  Urinalysis: No results for input(s): COLORURINE, LABSPEC, PHURINE, GLUCOSEU, HGBUR, BILIRUBINUR, KETONESUR, PROTEINUR, UROBILINOGEN, NITRITE, LEUKOCYTESUR in the last 72 hours.  Invalid input(s): APPERANCEUR      Imaging: Dg Pelvis 1-2 Views  Result Date: 03/10/2017 CLINICAL DATA:  Pelvis pain today, greater on the right. EXAM: PELVIS - 1-2 VIEW COMPARISON:  Pelvis CT dated 03/06/2017. FINDINGS: Bilateral groins surgical clips. Diffuse osteopenia. Mild bilateral hip degenerative changes. Lower lumbar spine degenerative changes. Ingested oral contrast in the bowel. IMPRESSION: No acute abnormality. Electronically Signed   By: Beckie Salts M.D.   On: 03/10/2017 14:38      Assessment & Plan: Pt is a 83 y.o. W male with coronary disease, CABG in 2013, right knee replacement,GERD, coronary disease, peripheral vascular disease, was admitted on 03/04/2017 with hyponatremia. Recent symptoms of ataxia, poor balance, fall, poor appetite, diagnosed with duodenal ulcer during this hospitalization  1.  Chronic hyponatremia with recent worsening Possibly secondary to :"tea and toast diet."; Other differential includes SIADH Encouraged patient and family to increase calorie intake.  No dietary restrictions at present Low Sodium levels are thought to contribute to his ataxia and fall.  We will start him on low-dose 3% saline and follow sodium levels closely.  Goal to improve sodium levels to close to normal to see if that improves his mental status, appetite and general malaise Workup so far shows normal TSH, urine osmolality of 424, urine sodium of 61 Will obtain SPEP, UPEP, cortisol in the morning

## 2017-03-10 NOTE — Care Management Important Message (Signed)
Important Message  Patient Details  Name: Bobby [name] MRN: [id number] Date of Birth: 08-26-1932   Medicare Important Message Given:  Yes    Eber Hong, RN 03/10/2017, 8:16 AM

## 2017-03-10 NOTE — Care Management Important Message (Signed)
Important Message  Patient Details  Name: Bobby [name] MRN: [id number] Date of Birth: 1932-07-24   Medicare Important Message Given:  Yes    Eber Hong, RN 03/10/2017, 8:17 AM

## 2017-03-10 NOTE — Consult Note (Signed)
Referring Physician:  No referring provider defined for this encounter.  Primary Physician:  Danella Penton, MD  Chief Complaint:  R leg pain  History of Present Illness: Bobby [name] is a 83 y.o. male who presents with the chief complaint of R leg pain. He is unable to give a complete history, although it seems that he has been having pain into his R leg particularly in his R anterolateral calf and top of foot for some time. This is burning and stabbing, but does not seem to be severe currently.  He has never had surgery.  He says his pain is worse with standing.  He is currently comfortable.  He has been admitted to the hospitalist service for evaluation of hyponatremia and leg cramping.  He did not tell me about back pain, but has reported it in prior encounters with Dr. Hyacinth Meeker and others it seems.  The symptoms are causing a significant impact on the patient's life.   Review of Systems:  A 10 point review of systems is negative, except for the pertinent positives and negatives detailed in the HPI.  Past Medical History: Past Medical History:  Diagnosis Date  . Arthritis    (R)TKR 5/13 [facility]  . Coronary artery disease    2 stents  . GERD (gastroesophageal reflux disease)   . Myocardial infarction (HCC)    NSTEMI 11/11/12 @ [facility]  . Peripheral vascular disease [facility])    (L)CEA 2007 [place]  . PONV (postoperative nausea and vomiting)   . Stroke [facility]) 2015    Past Surgical History: Past Surgical History:  Procedure Laterality Date  . CARDIAC CATHETERIZATION     11/11/12 [facility],2000,2006 w/ stents  . CATARACT EXTRACTION    . CORONARY ARTERY BYPASS GRAFT  11/16/2012   Procedure: CORONARY ARTERY BYPASS GRAFTING (CABG);  Surgeon: Kerin Perna, MD;  Location: [facility] OR;  Service: Open Heart Surgery;  Laterality: N/A;  times four using Bilateral Greater Saphenous Vein Graft harvested endoscopically from bilateral thighs, and Left Internal Mammary Artery.  .  ESOPHAGOGASTRODUODENOSCOPY (EGD) WITH PROPOFOL N/A 03/07/2017   Procedure: ESOPHAGOGASTRODUODENOSCOPY (EGD) WITH PROPOFOL;  Surgeon: Wyline Mood, MD;  Location: [facility] ENDOSCOPY;  Service: Endoscopy;  Laterality: N/A;  . HERNIA REPAIR  01/17/13   Dr Renda Rolls @ [facility]  . INTRAOPERATIVE TRANSESOPHAGEAL ECHOCARDIOGRAM  11/16/2012   Procedure: INTRAOPERATIVE TRANSESOPHAGEAL ECHOCARDIOGRAM;  Surgeon: Kerin Perna, MD;  Location: [facility] OR;  Service: Open Heart Surgery;  Laterality: N/A;  . JOINT REPLACEMENT     (R)TKR 5/13 [facility]  . VASCULAR SURGERY     (R)TKR 5/13 [facility]    Allergies: Allergies as of 03/04/2017 - Review Complete 03/04/2017  Allergen Reaction Noted  . Percocet [oxycodone-acetaminophen]  02/07/2013  . Septra [sulfamethoxazole-trimethoprim]  02/07/2013  . Tramadol hcl Other (See Comments) 11/14/2012    Medications:  Current Facility-Administered Medications:  .  acetaminophen (TYLENOL) tablet 650 mg, 650 mg, Oral, Q6H PRN, Adrian Saran, MD, 650 mg at 03/09/17 1303 .  albuterol (PROVENTIL) (2.5 MG/3ML) 0.083% nebulizer solution 2.5 mg, 2.5 mg, Nebulization, Q2H PRN, Shaune Pollack, MD .  alum & mag hydroxide-simeth (MAALOX/MYLANTA) 200-200-20 MG/5ML suspension 30 mL, 30 mL, Oral, Q6H PRN, Shaune Pollack, MD .  aspirin chewable tablet 81 mg, 81 mg, Oral, Daily, Shaune Pollack, MD, 81 mg at 03/10/17 1006 .  cholecalciferol (VITAMIN D) tablet 1,000 Units, 1,000 Units, Oral, Daily, Shaune Pollack, MD, 1,000 Units at 03/10/17 1006 .  enoxaparin (LOVENOX) injection 40 mg, 40 mg, Subcutaneous,  Q24H, Shaune Pollack, MD, 40 mg at 03/09/17 2000 .  feeding supplement (ENSURE ENLIVE) (ENSURE ENLIVE) liquid 237 mL, 237 mL, Oral, BID BM, Katharina Caper, MD, 237 mL at 03/10/17 1340 .  ferrous sulfate tablet 325 mg, 325 mg, Oral, Q M,W,F, Shaune Pollack, MD, 325 mg at 03/09/17 0955 .  gabapentin (NEURONTIN) capsule 100 mg, 100 mg, Oral, QHS, Shaune Pollack, MD, 100 mg at 03/09/17 2115 .  hydrALAZINE (APRESOLINE) injection 10 mg, 10 mg,  Intravenous, Q6H PRN, Shaune Pollack, MD .  lidocaine-prilocaine (EMLA) cream, , Topical, PRN, Katharina Caper, MD .  metoprolol tartrate (LOPRESSOR) tablet 12.5 mg, 12.5 mg, Oral, BID, Shaune Pollack, MD, 12.5 mg at 03/10/17 1005 .  ondansetron (ZOFRAN) tablet 4 mg, 4 mg, Oral, Q6H PRN **OR** ondansetron (ZOFRAN) injection 4 mg, 4 mg, Intravenous, Q6H PRN, Shaune Pollack, MD .  pantoprazole (PROTONIX) EC tablet 40 mg, 40 mg, Oral, BID AC, Katharina Caper, MD, 40 mg at 03/10/17 1632 .  senna-docusate (Senokot-S) tablet 1 tablet, 1 tablet, Oral, QHS PRN, Shaune Pollack, MD .  simvastatin (ZOCOR) tablet 10 mg, 10 mg, Oral, QHS, Shaune Pollack, MD, 10 mg at 03/09/17 2115 .  sodium chloride (hypertonic) 3 % solution, , Intravenous, Continuous, Harmeet Singh, MD, Last Rate: 30 mL/hr at 03/10/17 1443, 30 mL/hr at 03/10/17 1443 .  vitamin C (ASCORBIC ACID) tablet 500 mg, 500 mg, Oral, Daily, Shaune Pollack, MD, 500 mg at 03/10/17 1006   Social History: Social History  Substance Use Topics  . Smoking status: Former Smoker    Types: Cigars    Quit date: 10/22/1989  . Smokeless tobacco: Never Used  . Alcohol use No    Family Medical History: History reviewed. No pertinent family history.  Physical Examination: Vitals:   03/10/17 0514 03/10/17 1240  BP: (!) 150/71 (!) 109/49  Pulse: 70 77  Resp: 18 16  Temp: 98.6 F (37 C) 98.3 F (36.8 C)     General: Patient is well developed, well nourished, calm, collected, and in no apparent distress.  Psychiatric: Patient is non-anxious.  Head:  Pupils equal, round, and reactive to light.  ENT:  Oral mucosa appears well hydrated.  Neck:   Supple.  Full range of motion.  Respiratory: Patient is breathing without any difficulty.  Extremities: No edema.  Vascular: Palpable pulses distally.  Skin:   On exposed skin, there are no abnormal skin lesions.  NEUROLOGICAL:  General: In no acute distress.   Awake, alert, oriented to person, place, and time (year but not month).   Pupils equal round and reactive to light.  Facial tone is symmetric.  Tongue protrusion is midline.  There is no pronator drift.  ROM of spine: untested  Strength: Side Biceps Triceps Deltoid Interossei Grip Wrist Ext. Wrist Flex.  R L Side Iliopsoas Quads Hamstring PF DF EHL  R L Reflexes are 1+ and symmetric at the biceps, triceps, brachioradialis, patella and achilles.   Bilateral upper and lower extremity sensation is intact to light touch and pin prick.  Clonus is not present.  Toes are down-going.  Gait is untested  Hoffman's is absent.  Imaging: CLINICAL DATA:  Low back pain with right leg radicular pain and numbness. Several recent falls.  EXAM: MRI LUMBAR SPINE WITHOUT CONTRAST  TECHNIQUE: Multiplanar, multisequence MR imaging of the  lumbar spine was performed. No intravenous contrast was administered.  COMPARISON:  01/23/2007  FINDINGS: Segmentation:  Standard.  Alignment: Bilateral L5 pars defects with grade 3 anterolisthesis of L5 on S1 measuring 2.1 cm, minimally increased.  Vertebrae: Preserved vertebral body heights. New mild endplate edema at Z6-X0, likely degenerative. No osseous lesion.  Conus medullaris: Extends to the L1 level and appears normal.  Paraspinal and other soft tissues: Unremarkable.  Disc levels:  Disc desiccation throughout the lumbar spine.  T12-L1: Mild facet arthrosis without disc herniation or stenosis, unchanged.  L1-2: Mild disc space narrowing. Mild disc bulging and shallow left subarticular to foraminal disc protrusion result in new mild left lateral recess stenosis without spinal or neural foraminal stenosis.  L2-3: New mild disc space narrowing. Mild circumferential disc bulging and facet hypertrophy result in new borderline bilateral lateral recess stenosis and mild left greater than right neural foraminal stenosis without significant spinal  stenosis.  L3-4: Circumferential disc bulging and mild facet and ligamentum flavum hypertrophy result in new borderline to mild right lateral recess stenosis and mild right greater than left neural foraminal stenosis without significant spinal stenosis.  L4-5: Disc bulging and mild right and moderate left facet hypertrophy result in mild to moderate right and mild left neural foraminal stenosis without spinal stenosis, unchanged.  L5-S1: Listhesis with bulging uncovered disc result in severe right and moderate to severe left neural foraminal stenosis, similar to the prior study. Potential bilateral L5 nerve root impingement. No spinal stenosis.  IMPRESSION: 1. Bilateral L4 pars defects with minimally increased anterolisthesis and severe right greater than left foraminal stenosis. 2. Mildly progressive disc and facet degeneration elsewhere as above with up to mild lateral recess and mild-to-moderate foraminal stenosis.   Electronically Signed   By: Sebastian Ache M.D.   On: 02/03/2017 12:25  I have personally reviewed the images and agree with the above interpretation with the exception that he has L5 pars defects, not L4.  Assessment and Plan: Mr. Shipes is a pleasant 83 y.o. male with lumbar stenosis causing lumbar radiculopathy due to bilateral L5 pars defects and Grade 2 anterolisthesis.  He is not a surgical candidate for the surgery required to address this problem due to age and comorbidities. Extensive surgical intervention with a spinal fusion would likely not be tolerated by him. If tolerated, muscle relaxants and pain medications may help his pain.  I'd recommend referral to either Dr. Laban Emperor in pain management or Dr. Yves Dill in Physiatry on an outpatient basis for consideration of injections. He should continue physical therapy.  If back pain is problematic and activity-limiting, a Lumbar Spine Orthotic from [employer] could be useful.  He can follow-up with me as  needed.  He should follow-up with either pain management or physiatry primarily for control his symptoms.   Charlayne Vultaggio K. Myer Haff MD, MPHS Dept. of Neurosurgery

## 2017-03-11 LAB — SODIUM
SODIUM: 130 mmol/L — AB (ref 135–145)
Sodium: 128 mmol/L — ABNORMAL LOW (ref 135–145)
Sodium: 129 mmol/L — ABNORMAL LOW (ref 135–145)

## 2017-03-11 LAB — BASIC METABOLIC PANEL
Anion gap: 6 (ref 5–15)
BUN: 28 mg/dL — AB (ref 6–20)
CALCIUM: 8.6 mg/dL — AB (ref 8.9–10.3)
CHLORIDE: 98 mmol/L — AB (ref 101–111)
CO2: 24 mmol/L (ref 22–32)
Creatinine, Ser: 1.04 mg/dL (ref 0.61–1.24)
Glucose, Bld: 98 mg/dL (ref 65–99)
Potassium: 4.5 mmol/L (ref 3.5–5.1)
SODIUM: 128 mmol/L — AB (ref 135–145)

## 2017-03-11 LAB — CORTISOL: CORTISOL PLASMA: 8.5 ug/dL

## 2017-03-11 LAB — VITAMIN B1: Vitamin B1 (Thiamine): 100.2 nmol/L (ref 66.5–200.0)

## 2017-03-11 NOTE — Progress Notes (Signed)
Pt d/c'd with EMS and wife at bedside, HS meds given prior to departure. Packet given to EMS, VS taken.

## 2017-03-11 NOTE — Clinical Social Work Note (Signed)
Pt is ready for discharge today and will do to [facility]. Monia Pouch has approved after peer to peer. Pt and family are aware and agreeable to discharge plan. Facility is able to accept pt as they have received discharge information. RN will call report. [facility] will provide transportation. CSW is signing off as no further needs identified.   Dede Query, MSW, LCSW Clinical Social Worker  [phone]

## 2017-03-11 NOTE — Progress Notes (Signed)
Notified pharmacist that 02:42 sodium went from 124 to 128. No change in IV infusion. Next lab draw is at 0630.  Will continue to monitor. Henriette Combs RN

## 2017-03-11 NOTE — Clinical Social Work Placement (Signed)
   CLINICAL SOCIAL WORK PLACEMENT  NOTE  Date:  03/11/2017  Patient Details  Name: Bobby [name] MRN: [id number] Date of Birth: Nov 02, 1932  Clinical Social Work is seeking post-discharge placement for this patient at the Skilled  Nursing Facility level of care (*CSW will initial, date and re-position this form in  chart as items are completed):  Yes   Patient/family provided with [place] Clinical Social Work Department's list of facilities offering this level of care within the geographic area requested by the patient (or if unable, by the patient's family).  Yes   Patient/family informed of their freedom to choose among providers that offer the needed level of care, that participate in Medicare, Medicaid or managed care program needed by the patient, have an available bed and are willing to accept the patient.  Yes   Patient/family informed of [facility]'s ownership interest in [facility] and [facility], as well as of the fact that they are under no obligation to receive care at these facilities.  PASRR submitted to EDS on       PASRR number received on       Existing PASRR number confirmed on 03/05/17     FL2 transmitted to all facilities in geographic area requested by pt/family on 03/05/17     FL2 transmitted to all facilities within larger geographic area on       Patient informed that his/her managed care company has contracts with or will negotiate with certain facilities, including the following:        Yes   Patient/family informed of bed offers received.  Patient chooses bed at [facility]     Physician recommends and patient chooses bed at      Patient to be transferred to [facility] on 03/11/17.  Patient to be transferred to facility by [facility]     Patient family notified on 03/11/17 of transfer.  Name of family member notified:  Pt's wife and daughter in law     PHYSICIAN       Additional Comment:     _______________________________________________ Dede Query, LCSW 03/11/2017, 5:12 PM

## 2017-03-11 NOTE — Progress Notes (Signed)
Subjective:   Patient appears more alert today His wife states that he ate a little better last night and this morning Sodium is up to 128 this morning after he was started on 3% saline No shortness of breath or leg edema  Objective:  Vital signs in last 24 hours:  Temp:  [98.2 F (36.8 C)-98.3 F (36.8 C)] 98.2 F (36.8 C) (04/20 0610) Pulse Rate:  [70-82] 82 (04/20 0610) Resp:  [16-18] 18 (04/20 0610) BP: (102-142)/(59-64) 142/64 (04/20 0610) SpO2:  [94 %-99 %] 94 % (04/20 0610)  Weight change:  Filed Weights   03/04/17 1243 03/04/17 1624 03/08/17 0500  Weight: 76.2 kg (168 lb) 67.1 kg (148 lb) 69.9 kg (154 lb)    Intake/Output:    Intake/Output Summary (Last 24 hours) at 03/11/17 1433 Last data filed at 03/11/17 1355  Gross per 24 hour  Intake            798.5 ml  Output              300 ml  Net            498.5 ml     Physical Exam: General: No acute distress, laying in the bed  HEENT Anicteric, moist oral mucous membranes  Neck supple  Pulm/lungs Normal grieving effort  CVS/Heart No rub or gallop  Abdomen:  Soft, nontender, nondistended  Extremities: No peripheral edema  Neurologic: Alert, oriented  Skin: No rashes          Basic Metabolic Panel:   Recent Labs Lab 03/05/17 0915 03/06/17 0655 03/07/17 0811 03/08/17 0404  03/10/17 0357 03/10/17 1905 03/11/17 0242 03/11/17 0654 03/11/17 1033  NA 127* 126* 125* 125*  < > 125* 124* 128* 128* 129*  K 4.6 4.7  --  4.7  --  4.5  --  4.5  --   --   CL 98* 99*  --  96*  --  94*  --  98*  --   --   CO2 23 19* 23 22  --  24  --  24  --   --   GLUCOSE 86 84  --  94  --  98  --  98  --   --   BUN 20 23*  --  27*  --  28*  --  28*  --   --   CREATININE 1.02 0.98  --  0.87  --  1.03  --  1.04  --   --   CALCIUM 9.2 8.5*  --  8.7*  --  8.8*  --  8.6*  --   --   < > = values in this interval not displayed.   CBC:  Recent Labs Lab 03/06/17 0655  HGB 9.9*      Microbiology:  No results found  for this or any previous visit (from the past 720 hour(s)).  Coagulation Studies: No results for input(s): LABPROT, INR in the last 72 hours.  Urinalysis: No results for input(s): COLORURINE, LABSPEC, PHURINE, GLUCOSEU, HGBUR, BILIRUBINUR, KETONESUR, PROTEINUR, UROBILINOGEN, NITRITE, LEUKOCYTESUR in the last 72 hours.  Invalid input(s): APPERANCEUR    Imaging: Dg Pelvis 1-2 Views  Result Date: 03/10/2017 CLINICAL DATA:  Pelvis pain today, greater on the right. EXAM: PELVIS - 1-2 VIEW COMPARISON:  Pelvis CT dated 03/06/2017. FINDINGS: Bilateral groins surgical clips. Diffuse osteopenia. Mild bilateral hip degenerative changes. Lower lumbar spine degenerative changes. Ingested oral contrast in the bowel. IMPRESSION: No acute abnormality. Electronically Signed  By: Beckie Salts M.D.   On: 03/10/2017 14:38     Medications:   . sodium chloride (hypertonic) 35 mL/hr (03/11/17 0904)   . aspirin  81 mg Oral Daily  . cholecalciferol  1,000 Units Oral Daily  . enoxaparin (LOVENOX) injection  40 mg Subcutaneous Q24H  . feeding supplement (ENSURE ENLIVE)  237 mL Oral BID BM  . ferrous sulfate  325 mg Oral Q M,W,F  . gabapentin  100 mg Oral QHS  . metoprolol tartrate  12.5 mg Oral BID  . pantoprazole  40 mg Oral BID AC  . simvastatin  10 mg Oral QHS  . vitamin C  500 mg Oral Daily   acetaminophen, albuterol, alum & mag hydroxide-simeth, hydrALAZINE, lidocaine-prilocaine, ondansetron **OR** ondansetron (ZOFRAN) IV, senna-docusate  Assessment/ Plan:  83 y.o.caucsian male with coronary disease, CABG in 2013, right knee replacement,GERD, coronary disease, peripheral vascular disease, was admitted on 03/04/2017 with hyponatremia. Recent symptoms of ataxia, poor balance, fall, poor appetite, diagnosed with duodenal ulcer during this hospitalization  1.  Chronic hyponatremia with recent worsening Possibly secondary to :"tea and toast diet."; Other differential includes SIADH Encouraged patient  and family to increase calorie intake.  No dietary restrictions at present Low Sodium levels are thought to contribute to his ataxia and fall.   We will continue him on low-dose 3% saline and follow sodium levels closely.  Goal to improve sodium levels to close to normal to see if that improves his mental status, appetite and general malaise Workup so far shows normal TSH, urine osmolality of 424, urine sodium of 61 Will obtain SPEP, UPEP, cortisol ordered   LOS: 7 Tamlyn Sides 4/20/20182:33 PM

## 2017-03-11 NOTE — Progress Notes (Signed)
Patient discharged to [facility] per MD order. Report called to [place] at facility. EMS called for transportation.

## 2017-03-11 NOTE — Progress Notes (Signed)
[facility] - [place] at [facility]   PATIENT NAME: Bobby [name]    MR#:  [id number]  DATE OF BIRTH:  1932/09/24  SUBJECTIVE:     Patient started on 3% saline yesterday  Sodium 128 so rate increaed to 35 mL/hr this am  Family at bedside  Patient wating breakfast REVIEW OF SYSTEMS:    Review of Systems  Constitutional: Negative for fever, chills weight loss HENT: Negative for ear pain, nosebleeds, congestion, facial swelling, rhinorrhea, neck pain, neck stiffness and ear discharge.   Respiratory: Negative for cough, shortness of breath, wheezing  Cardiovascular: Negative for chest pain, palpitations and leg swelling.  Gastrointestinal: Negative for heartburn, abdominal pain, vomiting, diarrhea or consitpation Genitourinary: Negative for dysuria, urgency, frequency, hematuria Musculoskeletal: Negative for back pain or joint pain Neurological: Negative for dizziness, seizures, syncope, focal weakness,  numbness and headaches.  ++Radiculopathy right leg  Hematological: Does not bruise/bleed easily.  Psychiatric/Behavioral: Negative for hallucinations, confusion, dysphoric mood    Tolerating Diet: yes      DRUG ALLERGIES:   Allergies  Allergen Reactions  . Percocet [Oxycodone-Acetaminophen]     Hallucinations   . Septra [Sulfamethoxazole-Trimethoprim]   . Tramadol Hcl Other (See Comments)    Hallucinations    VITALS:  Blood pressure (!) 142/64, pulse 82, temperature 98.2 F (36.8 C), temperature source Oral, resp. rate 18, height  (1.676 m), weight 69.9 kg (154 lb), SpO2 94 %.  PHYSICAL EXAMINATION:  Constitutional: Appears well-developed and well-nourished. No distress. HENT: Normocephalic. Marland Kitchen Oropharynx is clear and moist.  Eyes: Conjunctivae and EOM are normal. PERRLA, no scleral icterus.  Neck: Normal ROM. Neck supple. No JVD. No tracheal deviation. CVS: RRR, S1/S2 +, 2/6 murmur, no gallops, no carotid bruit.  Pulmonary: Effort and  breath sounds normal, no stridor, rhonchi, wheezes, rales.  Abdominal: Soft. BS +,  no distension, tenderness, rebound or guarding.  Musculoskeletal: Normal range of motion. No edema and no tenderness.  Neuro: Alert. CN 2-12 grossly intact. No focal deficits. Skin: Skin is warm and dry. No rash noted. Psychiatric: Normal mood and affect.      LABORATORY PANEL:   CBC  Recent Labs Lab 03/06/17 0655  HGB 9.9*   ------------------------------------------------------------------------------------------------------------------  Chemistries   Recent Labs Lab 03/11/17 0242 03/11/17 0654  NA 128* 128*  K 4.5  --   CL 98*  --   CO2 24  --   GLUCOSE 98  --   BUN 28*  --   CREATININE 1.04  --   CALCIUM 8.6*  --    ------------------------------------------------------------------------------------------------------------------  Cardiac Enzymes  Recent Labs Lab 03/05/17 0915  TROPONINI <0.03   ------------------------------------------------------------------------------------------------------------------  RADIOLOGY:  Dg Pelvis 1-2 Views  Result Date: 03/10/2017 CLINICAL DATA:  Pelvis pain today, greater on the right. EXAM: PELVIS - 1-2 VIEW COMPARISON:  Pelvis CT dated 03/06/2017. FINDINGS: Bilateral groins surgical clips. Diffuse osteopenia. Mild bilateral hip degenerative changes. Lower lumbar spine degenerative changes. Ingested oral contrast in the bowel. IMPRESSION: No acute abnormality. Electronically Signed   By: Beckie Salts M.D.   On: 03/10/2017 14:38     ASSESSMENT AND PLAN:   83 year old male with a history of CVA and CAD who presented with generalized decline in health and poor by mouth intake with a sodium level of 124.  1.acute on chronic Hyponatremia: Initial thought that this was due to dehydration. After IV fluids were started sodium level increased appropriately then decrease. It was thought that patient may have component of SIADH. His  sodium level  has improved with 3% Continue to monitor BMP, stop 3% once sodium is 130 or greater.   2. Acute metabolic encephalopathy in the setting of hyponatremia and pre-existing cognitive deficits per wife Patient was evaluated by neurology Encephalopathy has improved  3. Adult failure to thrive with moderate protein calorie malnutrition due to poor by mouth intake due to nonbleeding duodenal ulcer. Patient will continue on nutritional supplements.  4. Nonbleeding duodenal ulcer with no stigmata of bleeding seen on EGD on April 16. Patient will continue on PPI  Patient will follow up with GI in 2 weeks and will need repeat upper endoscopy in 6 weeks.   5. CAD: Continue aspirin, metoprolol and simvastatin Echo Study Conclusions  - Left ventricle: The cavity size was normal. Wall thickness was normal. Systolic function was normal. The estimated ejection fraction was in the range of 50% to 55%. Regional wall motion abnormalities cannot be excluded. Doppler parameters are consistent with abnormal left ventricular relaxation (grade 1 diastolic dysfunction). - Ventricular septum: Septal motion showed dyssynergy. These changes are consistent with a post-thoracotomy state. - Aortic valve: Cusp separation was mildly reduced. Sclerosis without stenosis. - Mitral valve: There was mild regurgitation. - Left atrium: The atrium was mildly dilated. - Right ventricle: The cavity size was normal. Systolic function was mildly reduced. - Right atrium: The atrium was mildly dilated  6. Right lumbar radiculopathy: Patient had second opinion from Neurosurgery. He needs referral to either Dr. Laban Emperor in pain management on an outpatient basis for consideration of injections. He should continue physical therapy.  If back pain is problematic and activity-limiting, a Lumbar Spine Orthotic from [employer] could be useful. Patient not a candidate for surgery    Patient very weak and would benefit  from SNF not a safe discharge home at all High risk of coming back to hopsital.   I have called [phone] case [id number] and left message again for second time for appeal for SNF   Management plans discussed with the patient and he is in agreement.  CODE STATUS: dnr  TOTAL TIME TAKING CARE OF THIS PATIENT: 28 minutes.     POSSIBLE D/C today, DEPENDING ON insurance apporval for SNF  Salsabeel Gorelick M.D on 03/11/2017 at 10:46 AM  Between 7am to 6pm - Pager - [phone] After 6pm go to [email or web address] - password [email or web address]  [facility]  Office  [phone]  CC: Primary care physician; Danella Penton, MD  Note: This dictation was prepared with Dragon dictation along with smaller phrase technology. Any transcriptional errors that result from this process are unintentional.

## 2017-03-14 LAB — PROTEIN ELECTROPHORESIS, SERUM
A/G RATIO SPE: 0.9 (ref 0.7–1.7)
Albumin ELP: 2.4 g/dL — ABNORMAL LOW (ref 2.9–4.4)
Alpha-1-Globulin: 0.3 g/dL (ref 0.0–0.4)
Alpha-2-Globulin: 1 g/dL (ref 0.4–1.0)
Beta Globulin: 0.8 g/dL (ref 0.7–1.3)
GLOBULIN, TOTAL: 2.6 g/dL (ref 2.2–3.9)
Gamma Globulin: 0.5 g/dL (ref 0.4–1.8)
Total Protein ELP: 5 g/dL — ABNORMAL LOW (ref 6.0–8.5)

## 2017-03-14 LAB — PROTEIN ELECTRO, RANDOM URINE
Albumin ELP, Urine: 50.6 %
Alpha-1-Globulin, U: 6.2 %
Alpha-2-Globulin, U: 11.8 %
Beta Globulin, U: 22.3 %
Gamma Globulin, U: 9.2 %
TOTAL PROTEIN, URINE-UPE24: 36 mg/dL

## 2017-03-14 NOTE — Anesthesia Postprocedure Evaluation (Signed)
Anesthesia Post Note  Patient: Bobby [name]  Procedure(s) Performed: Procedure(s) (LRB): ESOPHAGOGASTRODUODENOSCOPY (EGD) WITH PROPOFOL (N/A)  Patient location during evaluation: Endoscopy Anesthesia Type: General Level of consciousness: awake and alert Pain management: pain level controlled Vital Signs Assessment: post-procedure vital signs reviewed and stable Respiratory status: spontaneous breathing, nonlabored ventilation, respiratory function stable and patient connected to nasal cannula oxygen Cardiovascular status: blood pressure returned to baseline and stable Postop Assessment: no signs of nausea or vomiting Anesthetic complications: no     Last Vitals:  Vitals:   03/11/17 1507 03/11/17 2053  BP: (!) 105/43 (!) 108/50  Pulse:  90  Resp: 20 19  Temp: 37 C 37.6 C    Last Pain:  Vitals:   03/11/17 2053  TempSrc: Oral  PainSc:                  Lenard Simmer

## 2017-03-16 ENCOUNTER — Telehealth: Payer: Self-pay

## 2017-03-16 NOTE — Telephone Encounter (Signed)
Advised results per Dr. Tobi Bastos.   Inform duodenal bx show inflammation- no H pylori- needs H pylori stool antigen to be checked , continue PPi for 6 weeks followed by EGD to check for healing of the large ulcer

## 2017-03-16 NOTE — Telephone Encounter (Signed)
-----   Message from Wyline Mood, MD sent at 03/16/2017  9:57 AM EDT ----- Inform duodenal bx show inflammation- no H pylori- needs H pylori stool antigen to be checked , continue PPi for 6 weeks followed by EGD to check for healing of the large ulcer

## 2017-03-29 ENCOUNTER — Encounter: Payer: Self-pay | Admitting: Interventional Cardiology

## 2017-03-30 ENCOUNTER — Ambulatory Visit (INDEPENDENT_AMBULATORY_CARE_PROVIDER_SITE_OTHER): Payer: Medicare HMO | Admitting: Interventional Cardiology

## 2017-03-30 ENCOUNTER — Encounter: Payer: Self-pay | Admitting: Interventional Cardiology

## 2017-03-30 VITALS — BP 120/60 | HR 50 | Ht 66.0 in | Wt 152.4 lb

## 2017-03-30 DIAGNOSIS — I25119 Atherosclerotic heart disease of native coronary artery with unspecified angina pectoris: Secondary | ICD-10-CM | POA: Diagnosis not present

## 2017-03-30 DIAGNOSIS — E782 Mixed hyperlipidemia: Secondary | ICD-10-CM | POA: Diagnosis not present

## 2017-03-30 DIAGNOSIS — E785 Hyperlipidemia, unspecified: Secondary | ICD-10-CM | POA: Insufficient documentation

## 2017-03-30 DIAGNOSIS — Z951 Presence of aortocoronary bypass graft: Secondary | ICD-10-CM | POA: Diagnosis not present

## 2017-03-30 NOTE — Patient Instructions (Addendum)
Medication Instructions:  Your physician recommends that you continue on your current medications as directed. Please refer to the Current Medication list given to you today.  Consult with your GI doctor regarding the etodolac  Labwork: None ordered.  Testing/Procedures: None ordered.  Follow-Up: Your physician wants you to follow-up in: 6 months with Dr. Eldridge DaceVaranasi. You will receive a reminder letter in the mail two months in advance. If you don't receive a letter, please call our office to schedule the follow-up appointment.   Any Other Special Instructions Will Be Listed Below (If Applicable).     If you need a refill on your cardiac medications before your next appointment, please call your pharmacy.

## 2017-03-30 NOTE — Progress Notes (Signed)
Cardiology Office Note   Date:  03/30/2017   ID:  Bobby [name], Born 1931/12/29, MRN [id number]  PCP:  Danella Penton, MD    No chief complaint on file. CAD   Wt Readings from Last 3 Encounters:  03/30/17 152 lb 6.4 oz (69.1 kg)  03/08/17 154 lb (69.9 kg)  12/06/16 151 lb 14.4 oz (68.9 kg)       History of Present Illness: Bobby [name] is a 83 y.o. male with history of coronary artery disease, peripheral vascular disease. He has had prior MI and stroke as well. He had coronary stents in the years 2000 and 2006. He had coronary artery bypass grafting in December 2013. He is also had a carotid endarterectomy in 2007.   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 He also was found to have a duodenal ulcer which was nonbleeding. He has had issues with weight loss.  His aspirin was stopped due to the ulcer. He is supposed to follow-up with a gastroenterologist.  He also had mental status changes and was diagnosed with encephalopathy as well as dementia.   He feels that he has been getting stronger. He is walking with his walker. He has not required any sublingual nitroglycerin. He has not fallen. Son reports that he is frequently fatigued. He was in the hospital for prolonged period of time and in a rehabilitation facility.  No bleeding problems. He still has not restarted his aspirin. He is taking Lodine prn for back pain.  Back pain has been better since several injections. He is no longer taking Naprosyn.    Past Medical History:  Diagnosis Date  . Arthritis    (R)TKR 5/13 [facility]  . Coronary artery disease    2 stents  . GERD (gastroesophageal reflux disease)   . Myocardial infarction (HCC)    NSTEMI 11/11/12 @ [facility]  . Peripheral vascular disease [facility])    (L)CEA 2007 [facility]  . PONV (postoperative nausea and vomiting)   . Stroke [facility]) 2015    Past Surgical History:  Procedure Laterality Date  . CARDIAC CATHETERIZATION     11/11/12 [facility],2000,2006 w/ stents  . CATARACT EXTRACTION    . CORONARY ARTERY BYPASS GRAFT  11/16/2012   Procedure: CORONARY ARTERY BYPASS GRAFTING (CABG);  Surgeon: Kerin Perna, MD;  Location: [facility] OR;  Service: Open Heart Surgery;  Laterality: N/A;  times four using Bilateral Greater Saphenous Vein Graft harvested endoscopically from bilateral thighs, and Left Internal Mammary Artery.  . ESOPHAGOGASTRODUODENOSCOPY (EGD) WITH PROPOFOL N/A 03/07/2017   Procedure: ESOPHAGOGASTRODUODENOSCOPY (EGD) WITH PROPOFOL;  Surgeon: Wyline Mood, MD;  Location: [facility] ENDOSCOPY;  Service: Endoscopy;  Laterality: N/A;  . HERNIA REPAIR  01/17/13   Dr Renda Rolls @ [facility]  . INTRAOPERATIVE TRANSESOPHAGEAL ECHOCARDIOGRAM  11/16/2012   Procedure: INTRAOPERATIVE TRANSESOPHAGEAL ECHOCARDIOGRAM;  Surgeon: Kerin Perna, MD;  Location[facility] OR;  Service: Open Heart Surgery;  Laterality: N/A;  . JOINT REPLACEMENT     (R)TKR 5/13 [facility]  . VASCULAR SURGERY     (R)TKR 5/13 [facility]     Current Outpatient Prescriptions  Medication Sig Dispense Refill  . amLODipine (NORVASC) 5 MG tablet Take 5 mg by mouth daily.     Marland Kitchen aspirin EC 81 MG tablet Take 81 mg by mouth daily.     Alphonsus Sias Pollen 1000 MG TABS Take 2,000 mg by mouth 2 (two) times daily.    . Cholecalciferol (VITAMIN D3) 1000 units CAPS  Take 1,000 Units by mouth daily.    . cyclobenzaprine (FLEXERIL) 10 MG tablet Take 10 mg by mouth 3 (three) times daily as needed for muscle spasms.     Marland Kitchen. etodolac (LODINE) 400 MG tablet     . feeding supplement, ENSURE ENLIVE, (ENSURE ENLIVE) LIQD Take 237 mLs by mouth 3 (three) times daily between meals. 90 Bottle 12  . ferrous sulfate 325 (65 FE) MG tablet Take 325 mg by mouth every Monday, Wednesday, and Friday.    . gabapentin (NEURONTIN) 100 MG capsule Take 100 mg by mouth at bedtime.    . lidocaine-prilocaine (EMLA) cream Apply topically as needed (pain). 30 g 0  . metoprolol tartrate (LOPRESSOR) 25 MG tablet Take 0.5 tablets (12.5  mg total) by mouth 2 (two) times daily. 30 tablet 1  . Multiple Vitamins-Minerals (OCUVITE PRESERVISION PO) Take 1 tablet by mouth 2 (two) times daily.    . pantoprazole (PROTONIX) 40 MG tablet Take 1 tablet (40 mg total) by mouth daily. 30 tablet 3  . senna-docusate (SENOKOT-S) 8.6-50 MG per tablet Take 1 tablet by mouth daily as needed for constipation.    . simvastatin (ZOCOR) 20 MG tablet Take 10 mg by mouth at bedtime.    . vitamin B-12 (CYANOCOBALAMIN) 1000 MCG tablet Take by mouth.    . vitamin C (ASCORBIC ACID) 500 MG tablet Take 500 mg by mouth daily.    . naproxen sodium (ANAPROX) 220 MG tablet Take 220 mg by mouth 2 (two) times daily with a meal.      No current facility-administered medications for this visit.     Allergies:   Ace inhibitors; Percocet [oxycodone-acetaminophen]; Septra [sulfamethoxazole-trimethoprim]; Sulfamethoxazole-trimethoprim; Tramadol; and Tramadol hcl    Social History:  The patient  reports that he quit smoking about 27 years ago. His smoking use included Cigars. He has never used smokeless tobacco. He reports that he does not drink alcohol or use drugs.   Family History:  The patient's family history includes Hypertension in his father.    ROS:  Please see the history of present illness.   Otherwise, review of systems are positive for fatigue.   All other systems are reviewed and negative.    PHYSICAL EXAM: VS:  BP 120/60 (BP Location: Left Arm, Patient Position: Sitting, Cuff Size: Normal)   Pulse (!) 50   Ht 5\' 6"  (1.676 m)   Wt 152 lb 6.4 oz (69.1 kg)   SpO2 99%   BMI 24.60 kg/m  , BMI Body mass index is 24.6 kg/m. GEN: Well nourished, well developed, in no acute distress  HEENT: normal  Neck: no JVD, carotid bruits, or masses Cardiac: bradycardic; no murmurs, rubs, or gallops,no edema  Respiratory:  clear to auscultation bilaterally, normal work of breathing GI: soft, nontender, nondistended, + BS MS: no deformity or atrophy  Skin: warm  and dry, no rash; hard knot- small, on the right shin Neuro:  Strength and sensation are intact Psych: euthymic mood, full affect   EKG:   The ekg ordered in 4/18 demonstrates NSR, RBBB   Recent Labs: 03/04/2017: Magnesium 1.8; Platelets 204 03/06/2017: Hemoglobin 9.9 03/09/2017: TSH 3.227 03/11/2017: BUN 28; Creatinine, Ser 1.04; Potassium 4.5; Sodium 130   Lipid Panel    Component Value Date/Time   CHOL 130 01/22/2014 0434   TRIG 60 01/22/2014 0434   HDL 42 01/22/2014 0434   VLDL 12 01/22/2014 0434   LDLCALC 76 01/22/2014 0434     Other studies Reviewed: Additional studies/ records  that were reviewed today with results demonstrating: Hospital records reviewed.   ASSESSMENT AND PLAN:  1. CAD: Angina controlled with medical therapy. He'll let us know if he has to use nitroglycerin. Continue aggressive secondary prevention including lipid-lowering therapy. 2. He may be able to restart aspirin based on the results of his GI appointment given the duodenal ulcer that he has. I asked him to check with his GI doctor before taking any more etodolac. 3. Hyperlipidemia: Managed with simvastatin. 4. He needs to avoid falling.   Current medicines are reviewed at length with the patient today.  The patient concerns regarding his medicines were addressed.  The following changes have been made:  No change  Labs/ tests ordered today include:  No orders of the defined types were placed in this encounter.   Recommend 150 minutes/week of aerobic exercise, as long as he can do it safely Low fat, low carb, high fiber diet recommended  Disposition:   FU in 6 months   Signed, Lance Muss, MD  03/30/2017 3:53 PM    [facility] [address] [place], [place], [place]  [address] Phone: [phone]; Fax: [phone]

## 2017-04-08 ENCOUNTER — Other Ambulatory Visit
Admission: RE | Admit: 2017-04-08 | Discharge: 2017-04-08 | Disposition: A | Discharge status: Home / Self Care | Payer: Medicare HMO | Source: Ambulatory Visit | Attending: Nephrology | Admitting: Nephrology

## 2017-04-08 DIAGNOSIS — K269 Duodenal ulcer, unspecified as acute or chronic, without hemorrhage or perforation: Secondary | ICD-10-CM | POA: Diagnosis present

## 2017-04-08 LAB — BASIC METABOLIC PANEL
ANION GAP: 6 (ref 5–15)
BUN: 21 mg/dL — ABNORMAL HIGH (ref 6–20)
CO2: 24 mmol/L (ref 22–32)
Calcium: 9.2 mg/dL (ref 8.9–10.3)
Chloride: 96 mmol/L — ABNORMAL LOW (ref 101–111)
Creatinine, Ser: 1.2 mg/dL (ref 0.61–1.24)
GFR calc Af Amer: >60 mL/min (ref >60)
GFR, EST NON AFRICAN AMERICAN: 54 mL/min — AB (ref >60)
Glucose, Bld: 95 mg/dL (ref 65–99)
POTASSIUM: 4.1 mmol/L (ref 3.5–5.1)
Sodium: 126 mmol/L — ABNORMAL LOW (ref 135–145)

## 2017-04-11 ENCOUNTER — Ambulatory Visit (INDEPENDENT_AMBULATORY_CARE_PROVIDER_SITE_OTHER): Payer: Medicare HMO | Admitting: Gastroenterology

## 2017-04-11 ENCOUNTER — Encounter: Payer: Self-pay | Admitting: Gastroenterology

## 2017-04-11 ENCOUNTER — Other Ambulatory Visit: Payer: Self-pay

## 2017-04-11 VITALS — BP 111/65 | HR 72 | Temp 97.9°F | Ht 66.0 in | Wt 154.0 lb

## 2017-04-11 DIAGNOSIS — K269 Duodenal ulcer, unspecified as acute or chronic, without hemorrhage or perforation: Secondary | ICD-10-CM

## 2017-04-11 DIAGNOSIS — K219 Gastro-esophageal reflux disease without esophagitis: Secondary | ICD-10-CM | POA: Insufficient documentation

## 2017-04-11 NOTE — Progress Notes (Signed)
Primary Care Physician: Danella Penton, MD  Primary Gastroenterologist:  Dr. Wyline Mood   No chief complaint on file.   HPI: Bobby [name] is a 83 y.o. male   He is here today for a hospital follow up . He was admitted in 02/2017 and seen by DR Bosie Clos when she presented with hyponatremia from dehydration , abdominal pain , dysphagia, unintentional weight loss. CT abdomen shows circumeferential wall thickening in the descending duodenum - neoplasm cannot be excluded . Multiple duodenal ulcerations were seen on EGD. Plan was for H pylori stool to be checked, use PPI and repeat EGD in a few weeks to check for healing .   Interval history 02/2017-03/2017  Readmitted a few days later with hyponatremia , metabolic encephelopathy .  BMP Latest Ref Rng & Units 04/08/2017 03/11/2017 03/11/2017  Glucose 65 - 99 mg/dL 95 - -  BUN 6 - 20 mg/dL 16(X) - -  Creatinine 0.96 - 1.24 mg/dL 0.45 - -  Sodium 409 - 145 mmol/L 126(L) 130(L) 129(L)  Potassium 3.5 - 5.1 mmol/L 4.1 - -  Chloride 101 - 111 mmol/L 96(L) - -  CO2 22 - 32 mmol/L 24 - -  Calcium 8.9 - 10.3 mg/dL 9.2 - -    Daughter says he has had a few episodes of black stools, has been on iron every other day . He was on "lodine " when we discovered the ulcers which he has since stopped. Not on Asprin either. Denies any abdominal pains.   No change in weight since last visit. Apetite is good .   Current Outpatient Prescriptions  Medication Sig Dispense Refill  . amLODipine (NORVASC) 5 MG tablet Take 5 mg by mouth daily.     Marland Kitchen aspirin EC 81 MG tablet Take 81 mg by mouth daily.     Alphonsus Sias Pollen 1000 MG TABS Take 2,000 mg by mouth 2 (two) times daily.    . Cholecalciferol (VITAMIN D3) 1000 units CAPS Take 1,000 Units by mouth daily.    . cyclobenzaprine (FLEXERIL) 10 MG tablet Take 10 mg by mouth 3 (three) times daily as needed for muscle spasms.     Marland Kitchen etodolac (LODINE) 400 MG tablet     . feeding supplement, ENSURE ENLIVE, (ENSURE ENLIVE)  LIQD Take 237 mLs by mouth 3 (three) times daily between meals. 90 Bottle 12  . ferrous sulfate 325 (65 FE) MG tablet Take 325 mg by mouth every Monday, Wednesday, and Friday.    . gabapentin (NEURONTIN) 100 MG capsule Take 100 mg by mouth at bedtime.    . lidocaine-prilocaine (EMLA) cream Apply topically as needed (pain). 30 g 0  . metoprolol tartrate (LOPRESSOR) 25 MG tablet Take 0.5 tablets (12.5 mg total) by mouth 2 (two) times daily. 30 tablet 1  . midodrine (PROAMATINE) 5 MG tablet     . Multiple Vitamins-Minerals (OCUVITE PRESERVISION PO) Take 1 tablet by mouth 2 (two) times daily.    . pantoprazole (PROTONIX) 40 MG tablet Take 1 tablet (40 mg total) by mouth daily. 30 tablet 3  . senna-docusate (SENOKOT-S) 8.6-50 MG per tablet Take 1 tablet by mouth daily as needed for constipation.    . simvastatin (ZOCOR) 20 MG tablet Take 10 mg by mouth at bedtime.    . sucralfate (CARAFATE) 1 g tablet     . vitamin B-12 (CYANOCOBALAMIN) 1000 MCG tablet Take by mouth.    . vitamin C (ASCORBIC ACID) 500 MG tablet Take 500 mg by mouth  daily.     No current facility-administered medications for this visit.     Allergies as of 04/11/2017 - Review Complete 03/30/2017  Allergen Reaction Noted  . Ace inhibitors Other (See Comments) 02/07/2014  . Percocet [oxycodone-acetaminophen]  02/07/2013  . Septra [sulfamethoxazole-trimethoprim]  02/07/2013  . Sulfamethoxazole-trimethoprim Other (See Comments) 02/07/2013  . Tramadol Other (See Comments) 02/07/2014  . Tramadol hcl Other (See Comments) 11/14/2012    ROS:  General: Negative for anorexia, weight loss, fever, chills, fatigue, weakness. ENT: Negative for hoarseness, difficulty swallowing , nasal congestion. CV: Negative for chest pain, angina, palpitations, dyspnea on exertion, peripheral edema.  Respiratory: Negative for dyspnea at rest, dyspnea on exertion, cough, sputum, wheezing.  GI: See history of present illness. GU:  Negative for dysuria,  hematuria, urinary incontinence, urinary frequency, nocturnal urination.  Endo: Negative for unusual weight change.    Physical Examination:   BP 111/65   Pulse 72   Temp 97.9 F (36.6 C) (Oral)   Ht 5\' 6"  (1.676 m)   Wt 154 lb (69.9 kg)   BMI 24.86 kg/m   General: Well-nourished, well-developed in no acute distress.  Eyes: No icterus. Conjunctivae pink. Mouth: Oropharyngeal mucosa moist and pink , no lesions erythema or exudate. Lungs: Clear to auscultation bilaterally. Non-labored. Heart: Regular rate and rhythm, no murmurs rubs or gallops.  Abdomen: Bowel sounds are normal, nontender, nondistended, no hepatosplenomegaly or masses, no abdominal bruits or hernia , no rebound or guarding.   Extremities: No lower extremity edema. No clubbing or deformities. Neuro: Alert and oriented x 3.  Grossly intact. Skin: Warm and dry, no jaundice.   Psych: Alert and cooperative, normal mood and affect.  CBC Latest Ref Rng & Units 03/06/2017 03/04/2017 12/07/2016  WBC 3.8 - 10.6 K/uL - 7.5 3.2(L)  Hemoglobin 13.0 - 18.0 g/dL 4.0(J9.9(L) 10.4(L) 10.3(L)  Hematocrit 40.0 - 52.0 % - 29.4(L) 28.7(L)  Platelets 150 - 440 K/uL - 204 161    Imaging Studies: No results found.  Assessment and Plan:   Bobby [name] is a 83 y.o. y/o male is here today for a hospital follow up . During his visit to the hospital in 02/2017 a CT scan showed an abnormal appearing duodenum concerning for neoplasm, An egd revealed multiple duodenal ulcers. At that time there was concern for unintentional weight loss as well and hyponatremia.   Plan  1. H pylori stool antigen  2. Check CBCD 3. Continue PPI for now , if nephrology want to commence him on any mineralocorticoid/steroid for hyponatremia   - it should be fine as long as he stays on a PPI.  4. Restart baby 81 mg asprin as the risks of thrombosis exceeds risks of bleeding at this time.  5. EGD in 6-8 weeks to evaluate for healing of duodenal ulcer    Dr Wyline MoodKiran Rushawn Capshaw   MD Follow up in 12 weeks

## 2017-06-14 ENCOUNTER — Encounter: Payer: Self-pay | Admitting: *Deleted

## 2017-06-14 ENCOUNTER — Ambulatory Visit
Admission: RE | Admit: 2017-06-14 | Discharge: 2017-06-14 | Disposition: A | Discharge status: Home / Self Care | Payer: Medicare HMO | Source: Ambulatory Visit | Attending: Gastroenterology | Admitting: Gastroenterology

## 2017-06-14 ENCOUNTER — Ambulatory Visit: Payer: Medicare HMO | Admitting: Certified Registered"

## 2017-06-14 ENCOUNTER — Encounter
Admission: RE | Disposition: A | Discharge status: Home / Self Care | Payer: Self-pay | Source: Ambulatory Visit | Attending: Gastroenterology

## 2017-06-14 DIAGNOSIS — M1990 Unspecified osteoarthritis, unspecified site: Secondary | ICD-10-CM | POA: Diagnosis not present

## 2017-06-14 DIAGNOSIS — Z888 Allergy status to other drugs, medicaments and biological substances status: Secondary | ICD-10-CM | POA: Insufficient documentation

## 2017-06-14 DIAGNOSIS — Z882 Allergy status to sulfonamides status: Secondary | ICD-10-CM | POA: Diagnosis not present

## 2017-06-14 DIAGNOSIS — Z09 Encounter for follow-up examination after completed treatment for conditions other than malignant neoplasm: Secondary | ICD-10-CM | POA: Insufficient documentation

## 2017-06-14 DIAGNOSIS — Z955 Presence of coronary angioplasty implant and graft: Secondary | ICD-10-CM | POA: Diagnosis not present

## 2017-06-14 DIAGNOSIS — Z885 Allergy status to narcotic agent status: Secondary | ICD-10-CM | POA: Diagnosis not present

## 2017-06-14 DIAGNOSIS — Z8711 Personal history of peptic ulcer disease: Secondary | ICD-10-CM | POA: Insufficient documentation

## 2017-06-14 DIAGNOSIS — Z8249 Family history of ischemic heart disease and other diseases of the circulatory system: Secondary | ICD-10-CM | POA: Diagnosis not present

## 2017-06-14 DIAGNOSIS — Z79899 Other long term (current) drug therapy: Secondary | ICD-10-CM | POA: Insufficient documentation

## 2017-06-14 DIAGNOSIS — Z96651 Presence of right artificial knee joint: Secondary | ICD-10-CM | POA: Insufficient documentation

## 2017-06-14 DIAGNOSIS — I739 Peripheral vascular disease, unspecified: Secondary | ICD-10-CM | POA: Diagnosis not present

## 2017-06-14 DIAGNOSIS — Z87891 Personal history of nicotine dependence: Secondary | ICD-10-CM | POA: Insufficient documentation

## 2017-06-14 DIAGNOSIS — I252 Old myocardial infarction: Secondary | ICD-10-CM | POA: Insufficient documentation

## 2017-06-14 DIAGNOSIS — Z9889 Other specified postprocedural states: Secondary | ICD-10-CM | POA: Diagnosis not present

## 2017-06-14 DIAGNOSIS — Z8673 Personal history of transient ischemic attack (TIA), and cerebral infarction without residual deficits: Secondary | ICD-10-CM | POA: Insufficient documentation

## 2017-06-14 DIAGNOSIS — Z791 Long term (current) use of non-steroidal anti-inflammatories (NSAID): Secondary | ICD-10-CM | POA: Insufficient documentation

## 2017-06-14 DIAGNOSIS — K269 Duodenal ulcer, unspecified as acute or chronic, without hemorrhage or perforation: Secondary | ICD-10-CM

## 2017-06-14 DIAGNOSIS — K219 Gastro-esophageal reflux disease without esophagitis: Secondary | ICD-10-CM | POA: Insufficient documentation

## 2017-06-14 HISTORY — PX: ESOPHAGOGASTRODUODENOSCOPY (EGD) WITH PROPOFOL: SHX5813

## 2017-06-14 SURGERY — ESOPHAGOGASTRODUODENOSCOPY (EGD) WITH PROPOFOL
Anesthesia: General

## 2017-06-14 MED ORDER — SODIUM CHLORIDE 0.9 % IV SOLN
INTRAVENOUS | Status: DC
  Administered 2017-06-14: 11:00:00 via INTRAVENOUS

## 2017-06-14 MED ORDER — PROPOFOL 10 MG/ML IV BOLUS
INTRAVENOUS | Status: DC | PRN
  Administered 2017-06-14: 50 mg via INTRAVENOUS

## 2017-06-14 MED ORDER — PROPOFOL 500 MG/50ML IV EMUL
INTRAVENOUS | Status: DC | PRN
  Administered 2017-06-14: 120 ug/kg/min via INTRAVENOUS

## 2017-06-14 NOTE — Anesthesia Post-op Follow-up Note (Cosign Needed)
Anesthesia QCDR form completed.        

## 2017-06-14 NOTE — Anesthesia Preprocedure Evaluation (Signed)
Anesthesia Evaluation  Patient identified by MRN, date of birth, ID band Patient awake    Reviewed: Allergy & Precautions, H&P , NPO status , Patient's Chart, lab work & pertinent test results, reviewed documented beta blocker date and time   History of Anesthesia Complications (+) PONV and history of anesthetic complications  Airway Mallampati: III  TM Distance: >3 FB Neck ROM: full    Dental  (+) Edentulous Upper, Edentulous Lower   Pulmonary neg pulmonary ROS, former smoker,           Cardiovascular Exercise Tolerance: Good (-) hypertension(-) angina+ CAD, + Past MI, + Cardiac Stents, + CABG and + Peripheral Vascular Disease  (-) dysrhythmias (-) Valvular Problems/Murmurs     Neuro/Psych neg Seizures PSYCHIATRIC DISORDERS (Dementia) CVA, No Residual Symptoms    GI/Hepatic Neg liver ROS, PUD, GERD  ,  Endo/Other  negative endocrine ROS  Renal/GU negative Renal ROS  negative genitourinary   Musculoskeletal   Abdominal   Peds  Hematology negative hematology ROS (+)   Anesthesia Other Findings Past Medical History: No date: Arthritis     Comment: (R)TKR 5/13 [facility] No date: Coronary artery disease     Comment: 2 stents No date: GERD (gastroesophageal reflux disease) No date: Myocardial infarction [facility](HCC)     Comment: NSTEMI 11/11/12 @ [facility] No date: Peripheral vascular disease (HCC)     Comment: (L)CEA 2007 [place] No date: PONV (postoperative nausea and vomiting) 2015: Stroke (HCC)   Reproductive/Obstetrics negative OB ROS                             Anesthesia Physical  Anesthesia Plan  ASA: III  Anesthesia Plan: General   Post-op Pain Management:    Induction: Intravenous  PONV Risk Score and Plan: 2 and Propofol  Airway Management Planned: Natural Airway and Nasal Cannula  Additional Equipment:   Intra-op Plan:   Post-operative Plan:   Informed Consent: I have  reviewed the patients History and Physical, chart, labs and discussed the procedure including the risks, benefits and alternatives for the proposed anesthesia with the patient or authorized representative who has indicated his/her understanding and acceptance.   Dental Advisory Given  Plan Discussed with: Anesthesiologist, CRNA and Surgeon  Anesthesia Plan Comments:         Anesthesia Quick Evaluation

## 2017-06-14 NOTE — Anesthesia Postprocedure Evaluation (Signed)
Anesthesia Post Note  Patient: Bobby [name]  Procedure(s) Performed: Procedure(s) (LRB): ESOPHAGOGASTRODUODENOSCOPY (EGD) WITH PROPOFOL (N/A)  Patient location during evaluation: Endoscopy Anesthesia Type: General Level of consciousness: awake and alert Pain management: pain level controlled Vital Signs Assessment: post-procedure vital signs reviewed and stable Respiratory status: spontaneous breathing, nonlabored ventilation, respiratory function stable and patient connected to nasal cannula oxygen Cardiovascular status: blood pressure returned to baseline and stable Postop Assessment: no signs of nausea or vomiting Anesthetic complications: no     Last Vitals:  Vitals:   06/14/17 1141 06/14/17 1151  BP: (!) 122/54 137/62  Pulse: (!) 51 (!) 54  Resp: 15 15  Temp:      Last Pain:  Vitals:   06/14/17 1151  TempSrc:   PainSc: 0-No pain                 Lenard SimmerAndrew Kingsten [place]field

## 2017-06-14 NOTE — H&P (Signed)
Bobby Mood MD [address] [place], [place] [address] Phone: [phone] Fax : [phone]  Primary Care Physician:  Danella Penton, MD Primary Gastroenterologist:  Dr. Wyline [name]   Pre-Procedure History & Physical: HPI:  Bobby [name] is a 83 y.o. male is here for an endoscopy.   Past Medical History:  Diagnosis Date  . Arthritis    (R)TKR 5/13 [facility]  . Coronary artery disease    2 stents  . GERD (gastroesophageal reflux disease)   . Myocardial infarction (HCC)    NSTEMI 11/11/12 @ [facility]  . Peripheral vascular disease [facility])    (L)CEA 2007 [place]  . PONV (postoperative nausea and vomiting)   . Stroke [facility]) 2015    Past Surgical History:  Procedure Laterality Date  . CARDIAC CATHETERIZATION     11/11/12 [facility],2000,2006 w/ stents  . CATARACT EXTRACTION    . CORONARY ARTERY BYPASS GRAFT  11/16/2012   Procedure: CORONARY ARTERY BYPASS GRAFTING (CABG);  Surgeon: Kerin Perna, MD;  Location: [facility] OR;  Service: Open Heart Surgery;  Laterality: N/A;  times four using Bilateral Greater Saphenous Vein Graft harvested endoscopically from bilateral thighs, and Left Internal Mammary Artery.  . ESOPHAGOGASTRODUODENOSCOPY (EGD) WITH PROPOFOL N/A 03/07/2017   Procedure: ESOPHAGOGASTRODUODENOSCOPY (EGD) WITH PROPOFOL;  Surgeon: Bobby Mood, MD;  Location: [facility] ENDOSCOPY;  Service: Endoscopy;  Laterality: N/A;  . HERNIA REPAIR  01/17/13   Dr Renda Rolls @ [facility]  . INTRAOPERATIVE TRANSESOPHAGEAL ECHOCARDIOGRAM  11/16/2012   Procedure: INTRAOPERATIVE TRANSESOPHAGEAL ECHOCARDIOGRAM;  Surgeon: Kerin Perna, MD;  Location: [facility] OR;  Service: Open Heart Surgery;  Laterality: N/A;  . JOINT REPLACEMENT     (R)TKR 5/13 [facility]  . VASCULAR SURGERY     (R)TKR 5/13 [facility]    Prior to Admission medications   Medication Sig Start Date End Date Taking? Authorizing Provider  ferrous sulfate 325 (65 FE) MG tablet Take 325 mg by mouth every Monday, Wednesday, and Friday.   Yes [provider]  metoprolol tartrate (LOPRESSOR) 25 MG tablet Take 0.5 tablets (12.5 mg total) by mouth 2 (two) times daily. 11/21/12  Yes Barrett, Erin R, PA-C  pantoprazole (PROTONIX) 40 MG tablet Take 1 tablet (40 mg total) by mouth daily. 03/08/17  Yes Katharina Caper, MD  simvastatin (ZOCOR) 20 MG tablet Take 10 mg by mouth at bedtime.   Yes [provider]  sucralfate (CARAFATE) 1 g tablet  03/29/17  Yes [provider]  amLODipine (NORVASC) 5 MG tablet Take 5 mg by mouth daily.  12/09/16   [provider]  aspirin EC 81 MG tablet Take 81 mg by mouth daily.     [provider]  Bee Pollen 1000 MG TABS Take 2,000 mg by mouth 2 (two) times daily.    [provider]  Cholecalciferol (VITAMIN D3) 1000 units CAPS Take 1,000 Units by mouth daily.    [provider]  etodolac (LODINE) 400 MG tablet  02/09/17   [provider]  feeding supplement, ENSURE ENLIVE, (ENSURE ENLIVE) LIQD Take 237 mLs by mouth 3 (three) times daily between meals. 03/08/17   Katharina Caper, MD  gabapentin (NEURONTIN) 100 MG capsule Take 100 mg by mouth at bedtime.    [provider]  lidocaine-prilocaine (EMLA) cream Apply topically as needed (pain). Patient not taking: Reported on 04/11/2017 03/08/17   Katharina Caper, MD  midodrine (PROAMATINE) 5 MG tablet  04/08/17   [provider]  Multiple Vitamins-Minerals (OCUVITE PRESERVISION PO) Take 1  tablet by mouth 2 (two) times daily.    [provider]  senna-docusate (SENOKOT-S) 8.6-50 MG per tablet Take 1 tablet by mouth daily as needed for constipation.    [provider]  vitamin B-12 (CYANOCOBALAMIN) 1000 MCG tablet Take by mouth.    [provider]  vitamin C (ASCORBIC ACID) 500 MG tablet Take 500 mg by mouth daily.    [provider]    Allergies as of 04/11/2017 - Review Complete 04/11/2017  Allergen Reaction Noted  . Ace inhibitors Other (See Comments)  02/07/2014  . Flexeril [cyclobenzaprine]  04/11/2017  . Percocet [oxycodone-acetaminophen]  02/07/2013  . Septra [sulfamethoxazole-trimethoprim]  02/07/2013  . Sulfamethoxazole-trimethoprim Other (See Comments) 02/07/2013  . Tramadol Other (See Comments) 02/07/2014  . Tramadol hcl Other (See Comments) 11/14/2012    Family History  Problem Relation Age of Onset  . Hypertension Father     Social History   Social History  . Marital status: Married    Spouse name: N/A  . Number of children: N/A  . Years of education: N/A   Occupational History  . Not on file.   Social History Main Topics  . Smoking status: Former Smoker    Types: Cigars    Quit date: 10/22/1989  . Smokeless tobacco: Never Used  . Alcohol use No  . Drug use: No  . Sexual activity: Not on file   Other Topics Concern  . Not on file   Social History Narrative  . No narrative on file    Review of Systems: See HPI, otherwise negative ROS  Physical Exam: BP (!) 157/63   Pulse (!) 59   Temp (!) 97.1 F (36.2 C) (Tympanic)   Resp 20   Ht 5\' 6"  (1.676 m)   Wt 154 lb (69.9 kg)   SpO2 98%   BMI 24.86 kg/m  General:   Alert,  pleasant and cooperative in NAD Head:  Normocephalic and atraumatic. Neck:  Supple; no masses or thyromegaly. Lungs:  Clear throughout to auscultation.    Heart:  Regular rate and rhythm. Abdomen:  Soft, nontender and nondistended. Normal bowel sounds, without guarding, and without rebound.   Neurologic:  Alert and  oriented x4;  grossly normal neurologically.  Impression/Plan: [name] is here for an endoscopy to be performed for checking of healing of duodenal ulcer   Risks, benefits, limitations, and alternatives regarding  endoscopy have been reviewed with the patient.  Questions have been answered.  All parties agreeable.   Bobby MoodKiran Liberty Stead, MD  06/14/2017, 11:00 AM

## 2017-06-14 NOTE — Transfer of Care (Signed)
Immediate Anesthesia Transfer of Care Note  Patient: Bobby [name]  Procedure(s) Performed: Procedure(s): ESOPHAGOGASTRODUODENOSCOPY (EGD) WITH PROPOFOL (N/A)  Patient Location: Endoscopy Unit  Anesthesia Type:General  Level of Consciousness: awake  Airway & Oxygen Therapy: Patient Spontanous Breathing and Patient connected to nasal cannula oxygen  Post-op Assessment: Report given to RN and Post -op Vital signs reviewed and stable  Post vital signs: Reviewed  Last Vitals:  Vitals:   06/14/17 1131 06/14/17 1133  BP: (!) 114/55 114/77  Pulse: (!) 55 (!) 54  Resp:  19  Temp: 36.4 C (!) 35.9 C    Last Pain:  Vitals:   06/14/17 1131  TempSrc: Tympanic  PainSc: 0-No pain      Patients Stated Pain Goal: 10 (06/14/17 1044)  Complications: No apparent anesthesia complications

## 2017-06-14 NOTE — Op Note (Signed)
[facility] Gastroenterology Patient Name: Bobby QuintHarvey Bolduc Procedure Date: 06/14/2017 11:14 AM MRN: [id number] Account #: [id number] Date of Birth: [birth year] Admit Type: Outpatient Age: 83 [facility]OM 1 Gender: Male Note Status: Finalized Procedure:            Upper GI endoscopy Indications:          Follow-up of peptic ulcer Providers:            Wyline MoodKiran Broghan Pannone MD, MD Referring MD:         Danella PentonMark F. Miller, MD (Referring MD) Medicines:            Monitored Anesthesia Care Complications:        No immediate complications. Procedure:            Pre-Anesthesia Assessment:                       - Prior to the procedure, a History and Physical was                        performed, and patient medications, allergies and                        sensitivities were reviewed. The patient's tolerance of                        previous anesthesia was reviewed.                       - The risks and benefits of the procedure and the                        sedation options and risks were discussed with the                        patient. All questions were answered and informed                        consent was obtained.                       - ASA Grade Assessment: III - A patient with severe                        systemic disease.                       After obtaining informed consent, the endoscope was                        passed under direct vision. Throughout the procedure,                        the patient's blood pressure, pulse, and oxygen                        saturations were monitored continuously. The                        Colonoscope was introduced through the mouth, and  advanced to the third part of duodenum. The upper GI                        endoscopy was accomplished with ease. The patient                        tolerated the procedure well. Findings:      The examined duodenum was normal.      The stomach was normal.  The esophagus was normal. Impression:           - Normal examined duodenum.                       - Normal stomach.                       - Normal esophagus.                       - No specimens collected. Recommendation:       - Discharge patient to home (with escort).                       - Resume previous diet.                       - Continue present medications.                       - Previously seen duodenal ulcer has healed. Suggest                        outpatient GI follow up . If still losing weight,                        suggest repeat CT scan to ensure the abnornmalities                        previously seen have resolved and to r/o extra duodenal                        lesions Procedure Code(s):    --- Professional ---                       [phone], Esophagogastroduodenoscopy, flexible, transoral;                        diagnostic, including collection of specimen(s) by                        brushing or washing, when performed (separate procedure) Diagnosis Code(s):    --- Professional ---                       K27.9, Peptic ulcer, site unspecified, unspecified as                        acute or chronic, without hemorrhage or perforation CPT copyright 2016 American Medical Association. All rights reserved. The codes documented in this report are preliminary and upon coder review may  be revised to meet current compliance requirements. Wyline Mood, MD Wyline Mood MD, MD 06/14/2017 11:26:33 AM This report  has been signed electronically. Number of Addenda: 0 Note Initiated On: 06/14/2017 11:14 AM      [facility]

## 2017-06-15 ENCOUNTER — Encounter: Payer: Self-pay | Admitting: Gastroenterology

## 2017-07-18 ENCOUNTER — Ambulatory Visit (INDEPENDENT_AMBULATORY_CARE_PROVIDER_SITE_OTHER): Payer: Self-pay | Admitting: Vascular Surgery

## 2017-07-18 ENCOUNTER — Encounter (INDEPENDENT_AMBULATORY_CARE_PROVIDER_SITE_OTHER): Payer: Medicare HMO

## 2017-08-18 ENCOUNTER — Ambulatory Visit (INDEPENDENT_AMBULATORY_CARE_PROVIDER_SITE_OTHER): Payer: Medicare HMO | Admitting: Vascular Surgery

## 2017-08-18 ENCOUNTER — Ambulatory Visit (INDEPENDENT_AMBULATORY_CARE_PROVIDER_SITE_OTHER): Payer: Medicare HMO

## 2017-08-18 ENCOUNTER — Other Ambulatory Visit (INDEPENDENT_AMBULATORY_CARE_PROVIDER_SITE_OTHER): Payer: Self-pay | Admitting: Vascular Surgery

## 2017-08-18 ENCOUNTER — Encounter (INDEPENDENT_AMBULATORY_CARE_PROVIDER_SITE_OTHER): Payer: Medicare HMO

## 2017-08-18 VITALS — BP 145/69 | HR 58 | Resp 14 | Ht 67.0 in | Wt 159.0 lb

## 2017-08-18 DIAGNOSIS — I739 Peripheral vascular disease, unspecified: Secondary | ICD-10-CM

## 2017-08-18 DIAGNOSIS — I25119 Atherosclerotic heart disease of native coronary artery with unspecified angina pectoris: Secondary | ICD-10-CM | POA: Diagnosis not present

## 2017-08-18 DIAGNOSIS — M47817 Spondylosis without myelopathy or radiculopathy, lumbosacral region: Secondary | ICD-10-CM | POA: Insufficient documentation

## 2017-08-18 DIAGNOSIS — I70219 Atherosclerosis of native arteries of extremities with intermittent claudication, unspecified extremity: Secondary | ICD-10-CM | POA: Insufficient documentation

## 2017-08-18 DIAGNOSIS — I70213 Atherosclerosis of native arteries of extremities with intermittent claudication, bilateral legs: Secondary | ICD-10-CM | POA: Diagnosis not present

## 2017-08-18 DIAGNOSIS — E782 Mixed hyperlipidemia: Secondary | ICD-10-CM | POA: Diagnosis not present

## 2017-08-18 DIAGNOSIS — M79605 Pain in left leg: Secondary | ICD-10-CM

## 2017-08-18 DIAGNOSIS — M79604 Pain in right leg: Secondary | ICD-10-CM

## 2017-08-18 DIAGNOSIS — K219 Gastro-esophageal reflux disease without esophagitis: Secondary | ICD-10-CM | POA: Diagnosis not present

## 2017-08-18 NOTE — Progress Notes (Signed)
MRN : [id number]  Bobby [name] is a 83 y.o. (09-Sep-1932) male who presents with chief complaint of  Chief Complaint  Patient presents with  . Follow-up    1 year ABI  .  History of Present Illness:The patient returns to the office for followup and review of the noninvasive studies. There have been no interval changes in lower extremity symptoms. No interval shortening of the patient's claudication distance or development of rest pain symptoms. No new ulcers or wounds have occurred since the last visit.  There have been no significant changes to the patient's overall health care.  The patient denies amaurosis fugax or recent TIA symptoms. There are no recent neurological changes noted. The patient denies history of DVT, PE or superficial thrombophlebitis. The patient denies recent episodes of angina or shortness of breath.   ABI Rt=1.10 and Lt=0.99  (previous ABI's Rt=1.22 and Lt=1.18)   Current Meds  Medication Sig  . amLODipine (NORVASC) 5 MG tablet Take 5 mg by mouth daily.   Alphonsus Sias Pollen 1000 MG TABS Take 2,000 mg by mouth 2 (two) times daily.  . Cholecalciferol (VITAMIN D3) 1000 units CAPS Take 1,000 Units by mouth daily.  Marland Kitchen docusate sodium (COLACE) 100 MG capsule Take by mouth.  . donepezil (ARICEPT) 5 MG tablet   . etodolac (LODINE) 400 MG tablet   . feeding supplement, ENSURE ENLIVE, (ENSURE ENLIVE) LIQD Take 237 mLs by mouth 3 (three) times daily between meals.  . ferrous sulfate 325 (65 FE) MG tablet Take by mouth.  . furosemide (LASIX) 20 MG tablet   . gabapentin (NEURONTIN) 100 MG capsule Take 100 mg by mouth at bedtime.  . lidocaine-prilocaine (EMLA) cream Apply topically as needed (pain).  . metoprolol tartrate (LOPRESSOR) 25 MG tablet Take 0.5 tablets (12.5 mg total) by mouth 2 (two) times daily.  . midodrine (PROAMATINE) 5 MG tablet   . Multiple Vitamins-Minerals (OCUVITE PRESERVISION PO) Take 1 tablet by mouth 2 (two) times daily.  . Multiple  Vitamins-Minerals (PRESERVISION AREDS PO) Take by mouth.  . pantoprazole (PROTONIX) 40 MG tablet Take 1 tablet (40 mg total) by mouth daily.  Marland Kitchen senna-docusate (SENOKOT-S) 8.6-50 MG per tablet Take 1 tablet by mouth daily as needed for constipation.  . simvastatin (ZOCOR) 20 MG tablet Take 10 mg by mouth at bedtime.  . SODIUM CHLORIDE PO Take by mouth.  . sucralfate (CARAFATE) 1 g tablet   . vitamin B-12 (CYANOCOBALAMIN) 1000 MCG tablet Take by mouth.  . vitamin C (ASCORBIC ACID) 500 MG tablet Take 500 mg by mouth daily.  . [DISCONTINUED] aspirin EC 81 MG tablet Take 81 mg by mouth daily.     Past Medical History:  Diagnosis Date  . Arthritis    (R)TKR 5/13 [facility]  . Coronary artery disease    2 stents  . GERD (gastroesophageal reflux disease)   . Myocardial infarction (HCC)    NSTEMI 11/11/12 @ [facility]  . Peripheral vascular disease [facility])    (L)CEA 2007 [place]  . PONV (postoperative nausea and vomiting)   . Stroke [facility]) 2015    Past Surgical History:  Procedure Laterality Date  . CARDIAC CATHETERIZATION     11/11/12 [facility],2000,2006 w/ stents  . CATARACT EXTRACTION    . CORONARY ARTERY BYPASS GRAFT  11/16/2012   Procedure: CORONARY ARTERY BYPASS GRAFTING (CABG);  Surgeon: Kerin Perna, MD;  Location: [facility] OR;  Service: Open Heart Surgery;  Laterality: N/A;  times four using Bilateral Greater Saphenous Vein  Graft harvested endoscopically from bilateral thighs, and Left Internal Mammary Artery.  . ESOPHAGOGASTRODUODENOSCOPY (EGD) WITH PROPOFOL N/A 03/07/2017   Procedure: ESOPHAGOGASTRODUODENOSCOPY (EGD) WITH PROPOFOL;  Surgeon: Wyline Mood, MD;  Location: [facility] ENDOSCOPY;  Service: Endoscopy;  Laterality: N/A;  . ESOPHAGOGASTRODUODENOSCOPY (EGD) WITH PROPOFOL N/A 06/14/2017   Procedure: ESOPHAGOGASTRODUODENOSCOPY (EGD) WITH PROPOFOL;  Surgeon: Wyline Mood, MD;  [facility] END[facility]COPY;  Service: Endoscopy;  Laterality: N/A;  . HERNIA REPAIR  01/17/13   Dr Renda Rolls @ [facility]  .  INTRAOPERATIVE TRANSESOPHAGEAL ECHOCARDIOGRAM  11/16/2012   Procedure: INTRAOPERATIVE TRANSESOPHAGEAL ECHOCARDIOGRAM;  Surgeon: Kerin Perna, MD;  Location: [facility] OR;  Service: Open Heart Surgery;  Laterality: N/A;  . JOINT REPLACEMENT     (R)TKR 5/13 [facility]  . VASCULAR SURGERY     (R)TKR 5/13 [facility]    Social History Social History  Substance Use Topics  . Smoking status: Former Smoker    Types: Cigars    Quit date: 10/22/1989  . Smokeless tobacco: Never Used  . Alcohol use No    Family History Family History  Problem Relation Age of Onset  . Hypertension Father     Allergies  Allergen Reactions  . Ace Inhibitors Other (See Comments)  . Flexeril [Cyclobenzaprine]     Alters mental status, hypersomnience  . Percocet [Oxycodone-Acetaminophen]     Hallucinations   . Septra [Sulfamethoxazole-Trimethoprim]   . Sulfamethoxazole-Trimethoprim Other (See Comments)  . Tramadol Other (See Comments)  . Tramadol Hcl Other (See Comments)    Hallucinations     REVIEW OF SYSTEMS (Negative unless checked)  Constitutional: Weight loss  Fever  Chills Cardiac: Chest pain   Chest pressure   Palpitations   Shortness of breath when laying flat   Shortness of breath with exertion. Vascular:  Pain in legs with walking   Pain in legs at rest  History of DVT   Phlebitis   Swelling in legs   Varicose veins   Non-healing ulcers Pulmonary:   Uses home oxygen   Productive cough   Hemoptysis   Wheeze  COPD   Asthma Neurologic:  Dizziness   Seizures   History of stroke   History of TIA  Aphasia   Vissual changes   Weakness or numbness in arm   Weakness or numbness in leg Musculoskeletal:   Joint swelling   Joint pain   Low back pain Hematologic:  Easy bruising  Easy bleeding   Hypercoagulable state   Anemic Gastrointestinal:  Diarrhea   Vomiting  Gastroesophageal reflux/heartburn   Difficulty swallowing. Genitourinary:   Chronic kidney disease   Difficult urination  Frequent urination   Blood in urine Skin:  Rashes   Ulcers  Psychological:  History of anxiety    History of major depression.  Physical Examination  Vitals:   08/18/17 1348  BP: (!) 145/69  Pulse: (!) 58  Resp: 14  Weight: 159 lb (72.1 kg)  Height:  (1.702 m)   Body mass index is 24.9 kg/m. Gen: WD/WN, NAD walks with a walker, frail appearing Head: [place]/AT, No temporalis wasting.  Ear/Nose/Throat: Hearing grossly intact, nares w/o erythema or drainage Eyes: PER, EOMI, sclera nonicteric.  Neck: Supple, no large masses.   Pulmonary:  Good air movement, no audible wheezing bilaterally, no use of accessory muscles.  Cardiac: RRR, no JVD Vascular:  No carotid bruits Vessel Right Left  Radial Palpable Palpable  Ulnar Palpable Palpable  Brachial Palpable Palpable  Carotid Palpable Palpable  PT Palpable Palpable  DP Palpable Palpable  Gastrointestinal: Non-distended. No guarding/no peritoneal signs.  Musculoskeletal: M/S 4/5 throughout.  No deformity or atrophy.  Neurologic: CN 2-12 intact. Symmetrical.  Speech is fluent. Motor exam as listed above. Psychiatric: Judgment intact, Mood & affect appropriate for pt's clinical situation. Dermatologic: No rashes or ulcers noted.  No changes consistent with cellulitis. Lymph : No lichenification or skin changes of chronic lymphedema.  CBC Lab Results  Component Value Date   WBC 7.5 03/04/2017   HGB 9.9 (L) 03/06/2017   HCT 29.4 (L) 03/04/2017   MCV 94.9 03/04/2017   PLT 204 03/04/2017    BMET    Component Value Date/Time   NA 126 (L) 04/08/2017 1344   NA 130 (L) 01/22/2014 0434   K 4.1 04/08/2017 1344   K 4.2 01/22/2014 0434   CL 96 (L) 04/08/2017 1344   CL 103 01/22/2014 0434   CO2 24 04/08/2017 1344   CO2 24 01/22/2014 0434   GLUCOSE 95 04/08/2017 1344   GLUCOSE 85 01/22/2014 0434   BUN 21 (H) 04/08/2017 1344   BUN 18 01/22/2014 0434   CREATININE 1.20  04/08/2017 1344   CREATININE 1.25 01/22/2014 0434   CALCIUM 9.2 04/08/2017 1344   CALCIUM 8.0 (L) 01/22/2014 0434   GFRNONAA 54 (L) 04/08/2017 1344   GFRNONAA 54 (L) 01/22/2014 0434   GFRAA >60 04/08/2017 1344   GFRAA >60 01/22/2014 0434   CrCl cannot be calculated (Patient's most recent lab result is older than the maximum 21 days allowed.).  COAG Lab Results  Component Value Date   INR 1.0 11/26/2012   INR 1.41 11/16/2012   INR 1.03 11/14/2012    Radiology No results found.   Assessment/Plan 1. Atherosclerosis of native artery of both lower extremities with intermittent claudication (HCC)  Recommend:  The patient has evidence of atherosclerosis of the lower extremities with claudication.  The patient does not voice lifestyle limiting changes at this point in time.  Noninvasive studies do not suggest clinically significant change.  No invasive studies, angiography or surgery at this time The patient should continue walking and begin a more formal exercise program.  The patient should continue antiplatelet therapy and aggressive treatment of the lipid abnormalities  No changes in the patient's medications at this time  The patient should continue wearing graduated compression socks 10-15 mmHg strength to control the mild edema.   - VAS [place] ABI WITH/WO TBI; Future  2. Pain in both lower extremities Likely a combination of his back and his ASO  3. Lumbar and sacral osteoarthritis Continue to follow with Spine service  4. Coronary artery disease involving native coronary artery of native heart with angina pectoris (HCC) Continue cardiac and antihypertensive medications as already ordered and reviewed, no changes at this time.  Continue statin as ordered and reviewed, no changes at this time  Nitrates PRN for chest pain   5. Gastroesophageal reflux disease without esophagitis Continue PPI as already ordered, these medications have been reviewed and there are no  changes at this time.   6. Mixed hyperlipidemia Continue statin as ordered and reviewed, no changes at this time     Levora Dredge, MD  08/18/2017 2:25 PM

## 2017-09-26 ENCOUNTER — Ambulatory Visit (INDEPENDENT_AMBULATORY_CARE_PROVIDER_SITE_OTHER): Payer: Medicare HMO | Admitting: Interventional Cardiology

## 2017-09-26 ENCOUNTER — Encounter: Payer: Self-pay | Admitting: Interventional Cardiology

## 2017-09-26 VITALS — BP 124/62 | HR 57 | Ht 67.0 in | Wt 163.2 lb

## 2017-09-26 DIAGNOSIS — K269 Duodenal ulcer, unspecified as acute or chronic, without hemorrhage or perforation: Secondary | ICD-10-CM | POA: Diagnosis not present

## 2017-09-26 DIAGNOSIS — E782 Mixed hyperlipidemia: Secondary | ICD-10-CM

## 2017-09-26 DIAGNOSIS — I25119 Atherosclerotic heart disease of native coronary artery with unspecified angina pectoris: Secondary | ICD-10-CM

## 2017-09-26 NOTE — Progress Notes (Signed)
Cardiology Office Note   Date:  09/26/2017   ID:  Bobby [name], DOB 01-31-1932, MRN [id number]  PCP:  Danella PentonMiller, Mark F, MD    No chief complaint on file.  CAD  Wt Readings from Last 3 Encounters:  09/26/17 163 lb 3.2 oz (74 kg)  08/18/17 159 lb (72.1 kg)  06/14/17 154 lb (69.9 kg)       History of Present Illness: Bobby [name] is a 83 y.o. male with history of coronary artery disease, peripheral vascular disease. He has had prior MI and stroke as well. He had coronary stents in the years 2000 and 2006. He had coronary artery bypass grafting in December 2013. He is also had a carotid endarterectomy in 2007.   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 He also was found to have a duodenal ulcer which was nonbleeding. He has had issues with weight loss.  His aspirin was stopped due to the ulcer. He also had mental status changes and was diagnosed with encephalopathy as well as dementia.    At the last visit, he was getting stronger.    Repeat endoscopy in 9/18 showed ulcer had healed.    Denies : Chest pain. Dizziness. Leg edema. Nitroglycerin use. Orthopnea. Palpitations. Paroxysmal nocturnal dyspnea. Shortness of breath. Syncope.   Wife is making healthy shakes for him.      Past Medical History:  Diagnosis Date  . Arthritis    (R)TKR 5/13 [facility]  . Coronary artery disease    2 stents  . GERD (gastroesophageal reflux disease)   . Myocardial infarction (HCC)    NSTEMI 11/11/12 @ [facility]  . Peripheral vascular disease [facility](HCC)    (L)CEA 2007 [place]  . PONV (postoperative nausea and vomiting)   . Stroke [facility](HCC) 2015    Past Surgical History:  Procedure Laterality Date  . CARDIAC CATHETERIZATION     11/11/12 [facility],2000,2006 w/ stents  . CATARACT EXTRACTION    . HERNIA REPAIR  01/17/13   [facility]  . JOINT REPLACEMENT     (R)TKR 5/13 [facility]  . [facility]ASC[facility] SURG[facility]     [facility]     Current Outpatient  Medications  Medication Sig Dispense Refill  . acetaminophen (TYLENOL) 650 MG CR tablet Take 650 mg 2 (two) times daily by mouth.    Marland Kitchen. amLODipine (NORVASC) 5 MG tablet Take 5 mg by mouth daily.     Alphonsus Sias. Bee Pollen 1000 MG TABS Take 2,000 mg by mouth 2 (two) times daily.    . Cholecalciferol (VITAMIN D3) 1000 units CAPS Take 1,000 Units by mouth daily.    Marland Kitchen. docusate sodium (COLACE) 100 MG capsule Take by mouth.    . donepezil (ARICEPT) 5 MG tablet     . etodolac (LODINE) 400 MG tablet     . feeding supplement, ENSURE ENLIVE, (ENSURE ENLIVE) LIQD Take 237 mLs by mouth 3 (three) times daily between meals. 90 Bottle 12  . ferrous sulfate 325 (65 FE) MG tablet Take by mouth.    . furosemide (LASIX) 20 MG tablet     . gabapentin (NEURONTIN) 100 MG capsule Take 100 mg by mouth at bedtime.    . lidocaine-prilocaine (EMLA) cream Apply topically as needed (pain). 30 g 0  . metoprolol tartrate (LOPRESSOR) 25 MG tablet Take 0.5 tablets (12.5 mg total) by mouth 2 (two) times daily. 30 tablet 1  . midodrine (PROAMATINE) 5 MG tablet     .  pantoprazole (PROTONIX) 40 MG tablet Take 1 tablet (40 mg total) by mouth daily. 30 tablet 3  . senna-docusate (SENOKOT-S) 8.6-50 MG per tablet Take 1 tablet by mouth daily as needed for constipation.    . simvastatin (ZOCOR) 20 MG tablet Take 10 mg by mouth at bedtime.    . SODIUM CHLORIDE PO Take by mouth.    . sucralfate (CARAFATE) 1 g tablet     . vitamin C (ASCORBIC ACID) 500 MG tablet Take 500 mg by mouth daily.     No current facility-administered medications for this visit.     Allergies:   Ace inhibitors; Cyclobenzaprine; Percocet [oxycodone-acetaminophen]; Septra [sulfamethoxazole-trimethoprim]; Sulfamethoxazole-trimethoprim; Tramadol; and Tramadol hcl    Social History:  The patient  reports that he quit smoking about 27 years ago. His smoking use included cigars. he has never used smokeless tobacco. He reports that he does not drink alcohol or use drugs.    Family History:  The patient's family history includes Hypertension in his father.    ROS:  Please see the history of present illness.   Otherwise, review of systems are positive for balance issues,memory issues.   All other systems are reviewed and negative.    PHYSICAL EXAM: VS:  BP 124/62   Pulse (!) 57   Ht 5\' 7"  (1.702 m)   Wt 163 lb 3.2 oz (74 kg)   SpO2 99%   BMI 25.56 kg/m  , BMI Body mass index is 25.56 kg/m. GEN: Well nourished, well developed, in no acute distress  HEENT: normal  Neck: no JVD, carotid bruits, or masses Cardiac: RRR; no murmurs, rubs, or gallops,no edema  Respiratory:  clear to auscultation bilaterally, normal work of breathing GI: soft, nontender, nondistended, + BS MS: no deformity or atrophy  Skin: warm and dry, no rash Neuro:  Strength and sensation are intact Psych: euthymic mood, full affect   Recent Labs: 03/04/2017: Magnesium 1.8; Platelets 204 03/06/2017: Hemoglobin 9.9 03/09/2017: TSH 3.227 04/08/2017: BUN 21; Creatinine, Ser 1.20; Potassium 4.1; Sodium 126   Lipid Panel    Component Value Date/Time   CHOL 130 01/22/2014 0434   TRIG 60 01/22/2014 0434   HDL 42 01/22/2014 0434   VLDL 12 01/22/2014 0434   LDLCALC 76 01/22/2014 0434     Other studies Reviewed: Additional studies/ records that were reviewed today with results demonstrating: GI records from [place].   ASSESSMENT AND PLAN:  1. CAD: No angina on medical therapy.  Continue current meds.   2. Ulcer: Healed.  Will need to see if aspirin can be restarted.  Will check with Dr. Tobi Bastos. 3. Hyperlipidemia: Follwed by PMD. COntinue simvastatin. 4. Avoid falling.   Current medicines are reviewed at length with the patient today.  The patient concerns regarding his medicines were addressed.  The following changes have been made:  No change  Labs/ tests ordered today include:  No orders of the defined types were placed in this encounter.   Recommend 150 minutes/week of  aerobic exercise Low fat, low carb, high fiber diet recommended  Disposition:   FU in 9 months   Signed, Lance Muss, MD  09/26/2017 3:32 PM    [facility] [address] [place], [place], [place]  [address] Phone: [phone]; Fax: [phone]

## 2017-09-26 NOTE — Patient Instructions (Signed)
Medication Instructions:  Your physician recommends that you continue on your current medications as directed. Please refer to the Current Medication list given to you today.   Labwork: None ordered  Testing/Procedures: None ordered  Follow-Up: Your physician wants you to follow-up in: 9 months with Dr. Varanasi. You will receive a reminder letter in the mail two months in advance. If you don't receive a letter, please call our office to schedule the follow-up appointment.   Any Other Special Instructions Will Be Listed Below (If Applicable).     If you need a refill on your cardiac medications before your next appointment, please call your pharmacy.   

## 2017-09-28 ENCOUNTER — Telehealth: Payer: Self-pay

## 2017-09-28 MED ORDER — ASPIRIN EC 81 MG PO TBEC: 81.0000 mg | DELAYED_RELEASE_TABLET | Freq: Every day | ORAL | 3 refills | Status: AC

## 2017-09-28 NOTE — Telephone Encounter (Signed)
-----   Message from Corky CraftsJayadeep S Varanasi, MD sent at 09/28/2017  3:29 AM EST -----   ----- Message ----- From: Wyline MoodAnna, Kiran, MD Sent: 09/27/2017   8:19 AM To: Corky CraftsJayadeep S Varanasi, MD  Good morning   Yes, absolutely he should restart his asprin   Regards  Kiran  ----- Message ----- From: Corky CraftsVaranasi, Jayadeep S, MD Sent: 09/26/2017   4:15 PM To: Wyline MoodKiran Anna, MD  Dr. Tobi BastosAnna, Is it ok for this patient to start aspirin 81mg  daily?

## 2017-09-28 NOTE — Telephone Encounter (Signed)
Called and spoke to patient's daughter in law [place] and made her aware that the patient should restart ASA 81 mg daily. She verbalized understanding and thanked me for the call.

## 2017-09-28 NOTE — Progress Notes (Signed)
OK to start enteric coated aspirin 81 mg daily

## 2017-11-24 DIAGNOSIS — W1830XA Fall on same level, unspecified, initial encounter: Secondary | ICD-10-CM | POA: Diagnosis not present

## 2017-11-24 DIAGNOSIS — S51011A Laceration without foreign body of right elbow, initial encounter: Secondary | ICD-10-CM | POA: Diagnosis not present

## 2017-11-24 DIAGNOSIS — Y92014 Private driveway to single-family (private) house as the place of occurrence of the external cause: Secondary | ICD-10-CM | POA: Diagnosis not present

## 2017-11-25 DIAGNOSIS — I10 Essential (primary) hypertension: Secondary | ICD-10-CM | POA: Diagnosis not present

## 2017-11-25 DIAGNOSIS — E871 Hypo-osmolality and hyponatremia: Secondary | ICD-10-CM | POA: Diagnosis not present

## 2018-01-03 DIAGNOSIS — I2510 Atherosclerotic heart disease of native coronary artery without angina pectoris: Secondary | ICD-10-CM | POA: Diagnosis not present

## 2018-01-03 DIAGNOSIS — Z0000 Encounter for general adult medical examination without abnormal findings: Secondary | ICD-10-CM | POA: Diagnosis not present

## 2018-01-03 DIAGNOSIS — E782 Mixed hyperlipidemia: Secondary | ICD-10-CM | POA: Diagnosis not present

## 2018-01-03 DIAGNOSIS — E871 Hypo-osmolality and hyponatremia: Secondary | ICD-10-CM | POA: Diagnosis not present

## 2018-03-29 DIAGNOSIS — N320 Bladder-neck obstruction: Secondary | ICD-10-CM | POA: Diagnosis not present

## 2018-03-31 DIAGNOSIS — E871 Hypo-osmolality and hyponatremia: Secondary | ICD-10-CM | POA: Diagnosis not present

## 2018-03-31 DIAGNOSIS — R600 Localized edema: Secondary | ICD-10-CM | POA: Diagnosis not present

## 2018-03-31 DIAGNOSIS — R339 Retention of urine, unspecified: Secondary | ICD-10-CM | POA: Diagnosis not present

## 2018-04-03 ENCOUNTER — Other Ambulatory Visit: Payer: Self-pay | Admitting: Nephrology

## 2018-04-03 DIAGNOSIS — R339 Retention of urine, unspecified: Secondary | ICD-10-CM

## 2018-04-05 ENCOUNTER — Ambulatory Visit
Admission: RE | Admit: 2018-04-05 | Discharge: 2018-04-05 | Disposition: A | Discharge status: Home / Self Care | Payer: PPO | Source: Ambulatory Visit | Attending: Nephrology | Admitting: Nephrology

## 2018-04-05 DIAGNOSIS — R339 Retention of urine, unspecified: Secondary | ICD-10-CM | POA: Diagnosis not present

## 2018-04-05 DIAGNOSIS — R944 Abnormal results of kidney function studies: Secondary | ICD-10-CM | POA: Insufficient documentation

## 2018-04-05 DIAGNOSIS — N3943 Post-void dribbling: Secondary | ICD-10-CM | POA: Diagnosis not present

## 2018-04-05 DIAGNOSIS — I10 Essential (primary) hypertension: Secondary | ICD-10-CM | POA: Diagnosis not present

## 2018-04-05 DIAGNOSIS — R39198 Other difficulties with micturition: Secondary | ICD-10-CM | POA: Diagnosis not present

## 2018-04-05 DIAGNOSIS — E871 Hypo-osmolality and hyponatremia: Secondary | ICD-10-CM | POA: Diagnosis not present

## 2018-04-05 DIAGNOSIS — R600 Localized edema: Secondary | ICD-10-CM | POA: Diagnosis not present

## 2018-04-14 ENCOUNTER — Other Ambulatory Visit
Admission: RE | Admit: 2018-04-14 | Discharge: 2018-04-14 | Disposition: A | Discharge status: Home / Self Care | Payer: PPO | Source: Ambulatory Visit | Attending: Nephrology | Admitting: Nephrology

## 2018-04-14 DIAGNOSIS — E871 Hypo-osmolality and hyponatremia: Secondary | ICD-10-CM | POA: Diagnosis not present

## 2018-04-14 DIAGNOSIS — I10 Essential (primary) hypertension: Secondary | ICD-10-CM | POA: Diagnosis not present

## 2018-04-14 DIAGNOSIS — R600 Localized edema: Secondary | ICD-10-CM | POA: Insufficient documentation

## 2018-04-14 LAB — RENAL FUNCTION PANEL
ALBUMIN: 3.9 g/dL (ref 3.5–5.0)
Anion gap: 7 (ref 5–15)
BUN: 20 mg/dL (ref 6–20)
CO2: 22 mmol/L (ref 22–32)
CREATININE: 1.08 mg/dL (ref 0.61–1.24)
Calcium: 9.3 mg/dL (ref 8.9–10.3)
Chloride: 94 mmol/L — ABNORMAL LOW (ref 101–111)
GFR calc Af Amer: >60 mL/min (ref >60)
Glucose, Bld: 99 mg/dL (ref 65–99)
PHOSPHORUS: 3.5 mg/dL (ref 2.5–4.6)
Potassium: 4.6 mmol/L (ref 3.5–5.1)
Sodium: 123 mmol/L — ABNORMAL LOW (ref 135–145)

## 2018-04-22 ENCOUNTER — Other Ambulatory Visit: Payer: Self-pay

## 2018-04-22 ENCOUNTER — Encounter: Payer: Self-pay | Admitting: Emergency Medicine

## 2018-04-22 ENCOUNTER — Emergency Department
Admission: EM | Admit: 2018-04-22 | Discharge: 2018-04-22 | Disposition: A | Discharge status: Home / Self Care | Payer: PPO | Attending: Emergency Medicine | Admitting: Emergency Medicine

## 2018-04-22 DIAGNOSIS — Z87891 Personal history of nicotine dependence: Secondary | ICD-10-CM | POA: Diagnosis not present

## 2018-04-22 DIAGNOSIS — I2510 Atherosclerotic heart disease of native coronary artery without angina pectoris: Secondary | ICD-10-CM | POA: Diagnosis not present

## 2018-04-22 DIAGNOSIS — M545 Low back pain: Secondary | ICD-10-CM | POA: Diagnosis not present

## 2018-04-22 DIAGNOSIS — Z951 Presence of aortocoronary bypass graft: Secondary | ICD-10-CM | POA: Insufficient documentation

## 2018-04-22 DIAGNOSIS — Z79899 Other long term (current) drug therapy: Secondary | ICD-10-CM | POA: Insufficient documentation

## 2018-04-22 DIAGNOSIS — F0390 Unspecified dementia without behavioral disturbance: Secondary | ICD-10-CM | POA: Diagnosis not present

## 2018-04-22 DIAGNOSIS — M549 Dorsalgia, unspecified: Secondary | ICD-10-CM | POA: Diagnosis not present

## 2018-04-22 DIAGNOSIS — E871 Hypo-osmolality and hyponatremia: Secondary | ICD-10-CM | POA: Insufficient documentation

## 2018-04-22 DIAGNOSIS — Z7982 Long term (current) use of aspirin: Secondary | ICD-10-CM | POA: Insufficient documentation

## 2018-04-22 DIAGNOSIS — R1030 Lower abdominal pain, unspecified: Secondary | ICD-10-CM | POA: Diagnosis present

## 2018-04-22 LAB — COMPREHENSIVE METABOLIC PANEL
ALBUMIN: 3.8 g/dL (ref 3.5–5.0)
ALT: 15 U/L — ABNORMAL LOW (ref 17–63)
ANION GAP: 7 (ref 5–15)
AST: 21 U/L (ref 15–41)
Alkaline Phosphatase: 67 U/L (ref 38–126)
BILIRUBIN TOTAL: 0.4 mg/dL (ref 0.3–1.2)
BUN: 25 mg/dL — ABNORMAL HIGH (ref 6–20)
CHLORIDE: 97 mmol/L — AB (ref 101–111)
CO2: 22 mmol/L (ref 22–32)
Calcium: 9 mg/dL (ref 8.9–10.3)
Creatinine, Ser: 1.16 mg/dL (ref 0.61–1.24)
GFR calc Af Amer: >60 mL/min (ref >60)
GFR calc non Af Amer: 56 mL/min — ABNORMAL LOW (ref >60)
Glucose, Bld: 105 mg/dL — ABNORMAL HIGH (ref 65–99)
POTASSIUM: 4.4 mmol/L (ref 3.5–5.1)
Sodium: 126 mmol/L — ABNORMAL LOW (ref 135–145)
TOTAL PROTEIN: 7 g/dL (ref 6.5–8.1)

## 2018-04-22 LAB — CBC
HEMATOCRIT: 32.3 % — AB (ref 40.0–52.0)
HEMOGLOBIN: 11.2 g/dL — AB (ref 13.0–18.0)
MCH: 33 pg (ref 26.0–34.0)
MCHC: 34.7 g/dL (ref 32.0–36.0)
MCV: 95 fL (ref 80.0–100.0)
Platelets: 249 10*3/uL (ref 150–440)
RBC: 3.4 MIL/uL — ABNORMAL LOW (ref 4.40–5.90)
RDW: 13.3 % (ref 11.5–14.5)
WBC: 5.7 10*3/uL (ref 3.8–10.6)

## 2018-04-22 LAB — URINALYSIS, COMPLETE (UACMP) WITH MICROSCOPIC
Bilirubin Urine: NEGATIVE
Glucose, UA: NEGATIVE mg/dL
Hgb urine dipstick: NEGATIVE
KETONES UR: NEGATIVE mg/dL
LEUKOCYTES UA: NEGATIVE
NITRITE: NEGATIVE
PROTEIN: 30 mg/dL — AB
SQUAMOUS EPITHELIAL / LPF: NONE SEEN (ref 0–5)
Specific Gravity, Urine: 1.006 (ref 1.005–1.030)
pH: 6 (ref 5.0–8.0)

## 2018-04-22 LAB — LIPASE, BLOOD: LIPASE: 28 U/L (ref 11–51)

## 2018-04-22 MED ORDER — SODIUM CHLORIDE 0.9 % IV BOLUS
500.0000 mL | Freq: Once | INTRAVENOUS | Status: AC
  Administered 2018-04-22: 500 mL via INTRAVENOUS

## 2018-04-22 NOTE — Discharge Instructions (Addendum)
Follow-up with your doctor on Monday as scheduled.  Return to the ER for new, worsening, or persistent abdominal pain, weakness, fevers, urinary problems, or any other new or worsening symptoms that concern you.

## 2018-04-22 NOTE — ED Notes (Signed)
Pt ambulatory to wheelchair upon discharge. Pt and family verbalized understanding of discharge instructions and follow-up care. VSS. Skin warm and dry. At baseline orientation, per family.

## 2018-04-22 NOTE — ED Notes (Signed)
ED Provider at bedside. 

## 2018-04-22 NOTE — ED Triage Notes (Signed)
Pt to ED c/o abd pain and lower mid back pain.  States back pain is chronic.  Denies n/v/d, denies urinary symptoms.  Patient skin warm and dry, chest rise even and unlabored.

## 2018-04-22 NOTE — ED Provider Notes (Signed)
[name] Emergency Department Provider Note ____________________________________________   First MD Initiated Contact with Patient 04/22/18 1238     (approximate)  I have reviewed the triage vital signs and the nursing notes.   HISTORY  Chief Complaint Abdominal Pain and Back Pain    HPI Bobby [name] is a 84 y.o. male with PMH as noted below as well as a history of hyponatremia who presents with lower abdominal pain and bilateral back pain, chronic but somewhat worse last night and into today, and associated with apparent low sodium.  The patient was seen at his doctor's office yesterday and was told his sodium was 123.  He was told that he probably needed to go to the hospital, but he preferred to wait through the weekend and take his oral salt pill instead.  The patient denies vomiting, fever, lightheadedness, or shortness of breath.  He does report urinary frequency.  Past Medical History:  Diagnosis Date  . Arthritis    (R)TKR 5/13 [facility]  . Coronary artery disease    2 stents  . GERD (gastroesophageal reflux disease)   . Myocardial infarction (HCC)    NSTEMI 11/11/12 @ [facility]  . Peripheral vascular disease [facility])    (L)CEA 2007 [place]  . PONV (postoperative nausea and vomiting)   . Stroke [facility]) 2015    Patient Active Problem List   Diagnosis Date Noted  . Atherosclerosis of native arteries of extremity with intermittent claudication (HCC) 08/18/2017  . Lumbar and sacral osteoarthritis 08/18/2017  . GERD (gastroesophageal reflux disease) 04/11/2017  . Hyperlipidemia 03/30/2017  . Acute metabolic encephalopathy 03/08/2017  . SIADH (syndrome of inappropriate ADH production) (HCC) 03/08/2017  . Malnutrition (HCC) 03/08/2017  . Duodenal ulcer 03/08/2017  . History of esophagogastroduodenoscopy (EGD) 03/08/2017  . Leg pain 03/08/2017  . Urinary urgency 03/08/2017  . Dementia 03/08/2017  . Hyponatremia 12/06/2016  . Cerebellar infarct  (HCC) 03/26/2015  . Inguinal hernia with history of incarceration-left 12/20/2012  . Coronary artery disease 11/21/2012  . Carotid artery disease (HCC) 11/21/2012  . S/P carotid endarterectomy 11/21/2012  . Stented coronary artery 11/21/2012  . S/P CABG x 4 11/21/2012  . Status post percutaneous transluminal coronary angioplasty 11/21/2012    Past Surgical History:  Procedure Laterality Date  . CARDIAC CATHETERIZATION     11/11/12 [facility],2000,2006 w/ stents  . CATARACT EXTRACTION    . CORONARY ARTERY BYPASS GRAFT  11/16/2012   Procedure: CORONARY ARTERY BYPASS GRAFTING (CABG);  Surgeon: Kerin Perna, MD;  Location[facility] OR;  Service: Open Heart Surgery;  Laterality: N/A;  times four using Bilateral Greater Saphenous Vein Graft harvested endoscopically from bilateral thighs, and Left Internal Mammary Artery.  . ESOPHAGOGASTRODUODENOSCOPY (EGD) WITH PROPOFOL N/A 03/07/2017   Procedure: ESOPHAGOGASTRODUODENOSCOPY (EGD) WITH PROPOFOL;  Surgeon: Wyline Mood, MD;  Location: [facility] ENDOSCOPY;  Service: Endoscopy;  Laterality: N/A;  . ESOPHAGOGASTRODUODENOSCOPY (EGD) WITH PROPOFOL N/A 06/14/2017   Procedure: ESOPHAGOGASTRODUODENOSCOPY (EGD) WITH PROPOFOL;  Surgeon: Wyline Mood, MD;  Location[facility] END[facility]COPY;  Service: Endoscopy;  Laterality: N/A;  . HERNIA REPAIR  01/17/13   Dr Renda Rolls @ [facility]  . INTRAOPERATIVE TRANSESOPHAGEAL ECHOCARDIOGRAM  11/16/2012   Procedure: INTRAOPERATIVE TRANSESOPHAGEAL ECHOCARDIOGRAM;  Surgeon: Kerin Perna, MD;  [facility];  Service: Open Heart Surgery;  Laterality: N/A;  . JOINT REPLACEMENT     (R)TKR 5/13 [facility]  . VASCULAR SURGERY     (R)TKR 5/13 [facility]    Prior to Admission medications   Medication Sig Start  Date End Date Taking? Authorizing Provider  acetaminophen (TYLENOL) 650 MG CR tablet Take 650 mg 2 (two) times daily by mouth.    [provider]  amLODipine (NORVASC) 5 MG tablet Take 5 mg by mouth daily.  12/09/16   [provider]    aspirin EC 81 MG tablet Take 1 tablet (81 mg total) daily by mouth. 09/28/17   Corky Crafts, MD  Bee Pollen 1000 MG TABS Take 2,000 mg by mouth 2 (two) times daily.    [provider]  Cholecalciferol (VITAMIN D3) 1000 units CAPS Take 1,000 Units by mouth daily.    [provider]  docusate sodium (COLACE) 100 MG capsule Take by mouth.    [provider]  donepezil (ARICEPT) 5 MG tablet  05/31/17   [provider]  etodolac (LODINE) 400 MG tablet  02/09/17   [provider]  feeding supplement, ENSURE ENLIVE, (ENSURE ENLIVE) LIQD Take 237 mLs by mouth 3 (three) times daily between meals. 03/08/17   Katharina Caper, MD  ferrous sulfate 325 (65 FE) MG tablet Take by mouth.    [provider]  furosemide (LASIX) 20 MG tablet  08/05/17   [provider]  gabapentin (NEURONTIN) 100 MG capsule Take 100 mg by mouth at bedtime.    [provider]  lidocaine-prilocaine (EMLA) cream Apply topically as needed (pain). 03/08/17   Katharina Caper, MD  metoprolol tartrate (LOPRESSOR) 25 MG tablet Take 0.5 tablets (12.5 mg total) by mouth 2 (two) times daily. 11/21/12   Barrett, Erin R, PA-C  midodrine (PROAMATINE) 5 MG tablet  04/08/17   [provider]  pantoprazole (PROTONIX) 40 MG tablet Take 1 tablet (40 mg total) by mouth daily. 03/08/17   Katharina Caper, MD  senna-docusate (SENOKOT-S) 8.6-50 MG per tablet Take 1 tablet by mouth daily as needed for constipation.    [provider]  simvastatin (ZOCOR) 20 MG tablet Take 10 mg by mouth at bedtime.    [provider]  SODIUM CHLORIDE PO Take by mouth.    [provider]  sucralfate (CARAFATE) 1 g tablet  03/29/17   [provider]  vitamin C (ASCORBIC ACID) 500 MG tablet Take 500 mg by mouth daily.    [provider]    Allergies Ace inhibitors; Cyclobenzaprine; Percocet [oxycodone-acetaminophen]; Septra  [sulfamethoxazole-trimethoprim]; Sulfamethoxazole-trimethoprim; Tramadol; and Tramadol hcl  Family History  Problem Relation Age of Onset  . Hypertension Father     Social History Social History   Tobacco Use  . Smoking status: Former Smoker    Types: Cigars    Last attempt to quit: 10/22/1989    Years since quitting: 28.5  . Smokeless tobacco: Never Used  Substance Use Topics  . Alcohol use: No  . Drug use: No    Review of Systems  Constitutional: No fever. Eyes: No redness. ENT: No sore throat. Cardiovascular: Denies chest pain. Respiratory: Denies shortness of breath. Gastrointestinal: No nausea or vomiting. Genitourinary: Positive for frequency.  Musculoskeletal: Positive for back pain. Skin: Negative for rash. Neurological: Negative for headache.   ____________________________________________   PHYSICAL EXAM:  VITAL SIGNS: ED Triage Vitals  Enc Vitals Group     BP 04/22/18 1117 (!) 153/80     Pulse Rate 04/22/18 1117 63     Resp 04/22/18 1117 16     Temp 04/22/18 1117 97.8 F (36.6 C)     Temp Source 04/22/18 1117 Oral     SpO2 04/22/18 1117 95 %  Weight 04/22/18 1118 160 lb (72.6 kg)     Height 04/22/18 1118 5\' 4"  (1.626 m)     Head Circumference --      Peak Flow --      Pain Score 04/22/18 1118 5     Pain Loc --      Pain Edu? --      Excl. in GC? --     Constitutional: Alert and oriented.  Relatively well appearing and in no acute distress. Eyes: Conjunctivae are normal.  Head: Atraumatic. Nose: No congestion/rhinnorhea. Mouth/Throat: Mucous membranes are moist.   Neck: Normal range of motion.  Cardiovascular: Normal rate, regular rhythm. Grossly normal heart sounds.  Good peripheral circulation. Respiratory: Normal respiratory effort.  No retractions. Lungs CTAB. Gastrointestinal: Soft with mild suprapubic discomfort but no focal tenderness.  No distention.  Genitourinary: No CVA tenderness. Musculoskeletal: No lower extremity edema.   Extremities warm and well perfused.  No midline spinal tenderness. Neurologic:  Normal speech and language.  Motor intact in all extremities.  No gross focal neurologic deficits are appreciated.  Skin:  Skin is warm and dry. No rash noted. Psychiatric: Mood and affect are normal. Speech and behavior are normal.  ____________________________________________   LABS (all labs ordered are listed, but only abnormal results are displayed)  Labs Reviewed  COMPREHENSIVE METABOLIC PANEL - Abnormal; Notable for the following components:      Result Value   Sodium 126 (*)    Chloride 97 (*)    Glucose, Bld 105 (*)    BUN 25 (*)    ALT 15 (*)    GFR calc non Af Amer 56 (*)    All other components within normal limits  CBC - Abnormal; Notable for the following components:   RBC 3.40 (*)    Hemoglobin 11.2 (*)    HCT 32.3 (*)    All other components within normal limits  URINALYSIS, COMPLETE (UACMP) WITH MICROSCOPIC - Abnormal; Notable for the following components:   Color, Urine STRAW (*)    APPearance CLEAR (*)    Protein, ur 30 (*)    Bacteria, UA RARE (*)    All other components within normal limits  LIPASE, BLOOD   ____________________________________________  EKG   ____________________________________________  RADIOLOGY    ____________________________________________   PROCEDURES  Procedure(s) performed: No  Procedures  Critical Care performed: No ____________________________________________   INITIAL IMPRESSION / ASSESSMENT AND PLAN / ED COURSE  Pertinent labs & imaging results that were available during my care of the patient were reviewed by me and considered in my medical decision making (see chart for details).  84 year old male with past medical history as noted below presents with lower abdominal back pain, and concern for worsening hyponatremia.  On exam, the patient is well-appearing for his age, vitals are normal except for slight hypertension, and the  exam is unremarkable except for some mild suprapubic discomfort but no focal tenderness.  Differential includes UTI/cystitis, muscular cramping related to hyponatremia, or mild dehydration.  There is no evidence of colitis, diverticulitis, or other acute intra-abdominal cause.  Plan: Labs, UA, and reassess.  ----------------------------------------- 3:22 PM on 04/22/2018 -----------------------------------------  Patient's sodium is now 126 up from 123 1 week ago.  His other labs are unremarkable, and the UA shows no concerning acute findings.  I will give a 500 cc bolus of NS to help bring the sodium up a little bit more and hydrate the patient.  He continues to appear well, and would like to  go home.  He will be safe for discharge after the fluids.  He is following up with his doctor already on Monday (2 days from now).  Return precautions given, and the patient expresses understanding.   ____________________________________________   FINAL CLINICAL IMPRESSION(S) / ED DIAGNOSES  Final diagnoses:  Hyponatremia      NEW MEDICATIONS STARTED DURING THIS VISIT:  New Prescriptions   No medications on file     Note:  This document was prepared using Dragon voice recognition software and may include unintentional dictation errors.    Dionne Bucy, MD 04/22/18 1524

## 2018-04-24 DIAGNOSIS — E871 Hypo-osmolality and hyponatremia: Secondary | ICD-10-CM | POA: Diagnosis not present

## 2018-04-24 DIAGNOSIS — R600 Localized edema: Secondary | ICD-10-CM | POA: Diagnosis not present

## 2018-04-24 DIAGNOSIS — I10 Essential (primary) hypertension: Secondary | ICD-10-CM | POA: Diagnosis not present

## 2018-05-03 DIAGNOSIS — M9903 Segmental and somatic dysfunction of lumbar region: Secondary | ICD-10-CM | POA: Diagnosis not present

## 2018-05-03 DIAGNOSIS — M5431 Sciatica, right side: Secondary | ICD-10-CM | POA: Diagnosis not present

## 2018-05-03 DIAGNOSIS — M9905 Segmental and somatic dysfunction of pelvic region: Secondary | ICD-10-CM | POA: Diagnosis not present

## 2018-05-03 DIAGNOSIS — M4306 Spondylolysis, lumbar region: Secondary | ICD-10-CM | POA: Diagnosis not present

## 2018-05-05 DIAGNOSIS — M4306 Spondylolysis, lumbar region: Secondary | ICD-10-CM | POA: Diagnosis not present

## 2018-05-05 DIAGNOSIS — M9905 Segmental and somatic dysfunction of pelvic region: Secondary | ICD-10-CM | POA: Diagnosis not present

## 2018-05-05 DIAGNOSIS — M9903 Segmental and somatic dysfunction of lumbar region: Secondary | ICD-10-CM | POA: Diagnosis not present

## 2018-05-05 DIAGNOSIS — M5431 Sciatica, right side: Secondary | ICD-10-CM | POA: Diagnosis not present

## 2018-05-08 DIAGNOSIS — R600 Localized edema: Secondary | ICD-10-CM | POA: Diagnosis not present

## 2018-05-08 DIAGNOSIS — M5431 Sciatica, right side: Secondary | ICD-10-CM | POA: Diagnosis not present

## 2018-05-08 DIAGNOSIS — E871 Hypo-osmolality and hyponatremia: Secondary | ICD-10-CM | POA: Diagnosis not present

## 2018-05-08 DIAGNOSIS — M4306 Spondylolysis, lumbar region: Secondary | ICD-10-CM | POA: Diagnosis not present

## 2018-05-08 DIAGNOSIS — M9903 Segmental and somatic dysfunction of lumbar region: Secondary | ICD-10-CM | POA: Diagnosis not present

## 2018-05-08 DIAGNOSIS — M9905 Segmental and somatic dysfunction of pelvic region: Secondary | ICD-10-CM | POA: Diagnosis not present

## 2018-05-08 DIAGNOSIS — I10 Essential (primary) hypertension: Secondary | ICD-10-CM | POA: Diagnosis not present

## 2018-05-10 DIAGNOSIS — M5431 Sciatica, right side: Secondary | ICD-10-CM | POA: Diagnosis not present

## 2018-05-10 DIAGNOSIS — M9903 Segmental and somatic dysfunction of lumbar region: Secondary | ICD-10-CM | POA: Diagnosis not present

## 2018-05-10 DIAGNOSIS — M4306 Spondylolysis, lumbar region: Secondary | ICD-10-CM | POA: Diagnosis not present

## 2018-05-10 DIAGNOSIS — M9905 Segmental and somatic dysfunction of pelvic region: Secondary | ICD-10-CM | POA: Diagnosis not present

## 2018-05-12 DIAGNOSIS — M9903 Segmental and somatic dysfunction of lumbar region: Secondary | ICD-10-CM | POA: Diagnosis not present

## 2018-05-12 DIAGNOSIS — M5431 Sciatica, right side: Secondary | ICD-10-CM | POA: Diagnosis not present

## 2018-05-12 DIAGNOSIS — M4306 Spondylolysis, lumbar region: Secondary | ICD-10-CM | POA: Diagnosis not present

## 2018-05-12 DIAGNOSIS — M9905 Segmental and somatic dysfunction of pelvic region: Secondary | ICD-10-CM | POA: Diagnosis not present

## 2018-05-15 DIAGNOSIS — M9903 Segmental and somatic dysfunction of lumbar region: Secondary | ICD-10-CM | POA: Diagnosis not present

## 2018-05-15 DIAGNOSIS — M4306 Spondylolysis, lumbar region: Secondary | ICD-10-CM | POA: Diagnosis not present

## 2018-05-15 DIAGNOSIS — M5431 Sciatica, right side: Secondary | ICD-10-CM | POA: Diagnosis not present

## 2018-05-15 DIAGNOSIS — E782 Mixed hyperlipidemia: Secondary | ICD-10-CM | POA: Diagnosis not present

## 2018-05-15 DIAGNOSIS — I2510 Atherosclerotic heart disease of native coronary artery without angina pectoris: Secondary | ICD-10-CM | POA: Diagnosis not present

## 2018-05-15 DIAGNOSIS — M9905 Segmental and somatic dysfunction of pelvic region: Secondary | ICD-10-CM | POA: Diagnosis not present

## 2018-05-17 DIAGNOSIS — M9903 Segmental and somatic dysfunction of lumbar region: Secondary | ICD-10-CM | POA: Diagnosis not present

## 2018-05-17 DIAGNOSIS — M9905 Segmental and somatic dysfunction of pelvic region: Secondary | ICD-10-CM | POA: Diagnosis not present

## 2018-05-17 DIAGNOSIS — M4306 Spondylolysis, lumbar region: Secondary | ICD-10-CM | POA: Diagnosis not present

## 2018-05-17 DIAGNOSIS — M5431 Sciatica, right side: Secondary | ICD-10-CM | POA: Diagnosis not present

## 2018-05-19 DIAGNOSIS — M5431 Sciatica, right side: Secondary | ICD-10-CM | POA: Diagnosis not present

## 2018-05-19 DIAGNOSIS — M4306 Spondylolysis, lumbar region: Secondary | ICD-10-CM | POA: Diagnosis not present

## 2018-05-19 DIAGNOSIS — M9905 Segmental and somatic dysfunction of pelvic region: Secondary | ICD-10-CM | POA: Diagnosis not present

## 2018-05-19 DIAGNOSIS — M9903 Segmental and somatic dysfunction of lumbar region: Secondary | ICD-10-CM | POA: Diagnosis not present

## 2018-05-22 DIAGNOSIS — D649 Anemia, unspecified: Secondary | ICD-10-CM | POA: Diagnosis not present

## 2018-05-22 DIAGNOSIS — I639 Cerebral infarction, unspecified: Secondary | ICD-10-CM | POA: Diagnosis not present

## 2018-05-22 DIAGNOSIS — Z0000 Encounter for general adult medical examination without abnormal findings: Secondary | ICD-10-CM | POA: Diagnosis not present

## 2018-05-22 DIAGNOSIS — E871 Hypo-osmolality and hyponatremia: Secondary | ICD-10-CM | POA: Diagnosis not present

## 2018-05-23 DIAGNOSIS — M9903 Segmental and somatic dysfunction of lumbar region: Secondary | ICD-10-CM | POA: Diagnosis not present

## 2018-05-23 DIAGNOSIS — M5431 Sciatica, right side: Secondary | ICD-10-CM | POA: Diagnosis not present

## 2018-05-23 DIAGNOSIS — M4306 Spondylolysis, lumbar region: Secondary | ICD-10-CM | POA: Diagnosis not present

## 2018-05-23 DIAGNOSIS — M9905 Segmental and somatic dysfunction of pelvic region: Secondary | ICD-10-CM | POA: Diagnosis not present

## 2018-05-24 DIAGNOSIS — M4306 Spondylolysis, lumbar region: Secondary | ICD-10-CM | POA: Diagnosis not present

## 2018-05-24 DIAGNOSIS — M5431 Sciatica, right side: Secondary | ICD-10-CM | POA: Diagnosis not present

## 2018-05-24 DIAGNOSIS — M9905 Segmental and somatic dysfunction of pelvic region: Secondary | ICD-10-CM | POA: Diagnosis not present

## 2018-05-24 DIAGNOSIS — M9903 Segmental and somatic dysfunction of lumbar region: Secondary | ICD-10-CM | POA: Diagnosis not present

## 2018-05-29 DIAGNOSIS — M9905 Segmental and somatic dysfunction of pelvic region: Secondary | ICD-10-CM | POA: Diagnosis not present

## 2018-05-29 DIAGNOSIS — M5431 Sciatica, right side: Secondary | ICD-10-CM | POA: Diagnosis not present

## 2018-05-29 DIAGNOSIS — M4306 Spondylolysis, lumbar region: Secondary | ICD-10-CM | POA: Diagnosis not present

## 2018-05-29 DIAGNOSIS — M9903 Segmental and somatic dysfunction of lumbar region: Secondary | ICD-10-CM | POA: Diagnosis not present

## 2018-05-31 DIAGNOSIS — M4306 Spondylolysis, lumbar region: Secondary | ICD-10-CM | POA: Diagnosis not present

## 2018-05-31 DIAGNOSIS — M5431 Sciatica, right side: Secondary | ICD-10-CM | POA: Diagnosis not present

## 2018-05-31 DIAGNOSIS — M9905 Segmental and somatic dysfunction of pelvic region: Secondary | ICD-10-CM | POA: Diagnosis not present

## 2018-05-31 DIAGNOSIS — M9903 Segmental and somatic dysfunction of lumbar region: Secondary | ICD-10-CM | POA: Diagnosis not present

## 2018-06-06 DIAGNOSIS — M9905 Segmental and somatic dysfunction of pelvic region: Secondary | ICD-10-CM | POA: Diagnosis not present

## 2018-06-06 DIAGNOSIS — M5431 Sciatica, right side: Secondary | ICD-10-CM | POA: Diagnosis not present

## 2018-06-06 DIAGNOSIS — M9903 Segmental and somatic dysfunction of lumbar region: Secondary | ICD-10-CM | POA: Diagnosis not present

## 2018-06-06 DIAGNOSIS — M4306 Spondylolysis, lumbar region: Secondary | ICD-10-CM | POA: Diagnosis not present

## 2018-06-12 DIAGNOSIS — M4306 Spondylolysis, lumbar region: Secondary | ICD-10-CM | POA: Diagnosis not present

## 2018-06-12 DIAGNOSIS — M9905 Segmental and somatic dysfunction of pelvic region: Secondary | ICD-10-CM | POA: Diagnosis not present

## 2018-06-12 DIAGNOSIS — M5431 Sciatica, right side: Secondary | ICD-10-CM | POA: Diagnosis not present

## 2018-06-12 DIAGNOSIS — M9903 Segmental and somatic dysfunction of lumbar region: Secondary | ICD-10-CM | POA: Diagnosis not present

## 2018-06-12 DIAGNOSIS — E871 Hypo-osmolality and hyponatremia: Secondary | ICD-10-CM | POA: Diagnosis not present

## 2018-06-14 DIAGNOSIS — M5431 Sciatica, right side: Secondary | ICD-10-CM | POA: Diagnosis not present

## 2018-06-14 DIAGNOSIS — M9903 Segmental and somatic dysfunction of lumbar region: Secondary | ICD-10-CM | POA: Diagnosis not present

## 2018-06-14 DIAGNOSIS — M4306 Spondylolysis, lumbar region: Secondary | ICD-10-CM | POA: Diagnosis not present

## 2018-06-14 DIAGNOSIS — M9905 Segmental and somatic dysfunction of pelvic region: Secondary | ICD-10-CM | POA: Diagnosis not present

## 2018-06-19 DIAGNOSIS — M9903 Segmental and somatic dysfunction of lumbar region: Secondary | ICD-10-CM | POA: Diagnosis not present

## 2018-06-19 DIAGNOSIS — M9905 Segmental and somatic dysfunction of pelvic region: Secondary | ICD-10-CM | POA: Diagnosis not present

## 2018-06-19 DIAGNOSIS — M5431 Sciatica, right side: Secondary | ICD-10-CM | POA: Diagnosis not present

## 2018-06-19 DIAGNOSIS — M4306 Spondylolysis, lumbar region: Secondary | ICD-10-CM | POA: Diagnosis not present

## 2018-07-07 DIAGNOSIS — H353132 Nonexudative age-related macular degeneration, bilateral, intermediate dry stage: Secondary | ICD-10-CM | POA: Diagnosis not present

## 2018-08-07 DIAGNOSIS — I10 Essential (primary) hypertension: Secondary | ICD-10-CM | POA: Diagnosis not present

## 2018-08-07 DIAGNOSIS — E871 Hypo-osmolality and hyponatremia: Secondary | ICD-10-CM | POA: Diagnosis not present

## 2018-08-07 DIAGNOSIS — R600 Localized edema: Secondary | ICD-10-CM | POA: Diagnosis not present

## 2018-08-09 DIAGNOSIS — K4030 Unilateral inguinal hernia, with obstruction, without gangrene, not specified as recurrent: Secondary | ICD-10-CM | POA: Diagnosis not present

## 2018-08-10 DIAGNOSIS — E871 Hypo-osmolality and hyponatremia: Secondary | ICD-10-CM | POA: Diagnosis not present

## 2018-08-18 ENCOUNTER — Telehealth (INDEPENDENT_AMBULATORY_CARE_PROVIDER_SITE_OTHER): Payer: Self-pay

## 2018-08-18 NOTE — Telephone Encounter (Signed)
Patient's wife called to cancel his appointment for Monday.  She would like a call back to reschedule for a later date.

## 2018-08-21 ENCOUNTER — Ambulatory Visit (INDEPENDENT_AMBULATORY_CARE_PROVIDER_SITE_OTHER): Payer: PPO | Admitting: Vascular Surgery

## 2018-08-21 ENCOUNTER — Encounter (INDEPENDENT_AMBULATORY_CARE_PROVIDER_SITE_OTHER): Payer: PPO

## 2018-10-12 IMAGING — MR MR HEAD W/O CM
10 series · 48 of 48 positions shown · non-contrast
Comparison: Head CT without contrast 12/06/2016. Brain MRI
01/21/2014

CLINICAL DATA: 84-year-old male with confusion an declining mental
status. Hyponatremia. Decreased p.o. intake. Recent fall.

EXAM:
MRI HEAD WITHOUT CONTRAST
TECHNIQUE: Multiplanar, multiecho pulse sequences of the brain and surrounding
structures were obtained without intravenous contrast.

[Series 2: T1 · sagittal · 5.0mm · 0.45mm/px · 3 of 23 slices shown (1 of 2)]
[im 1/23]
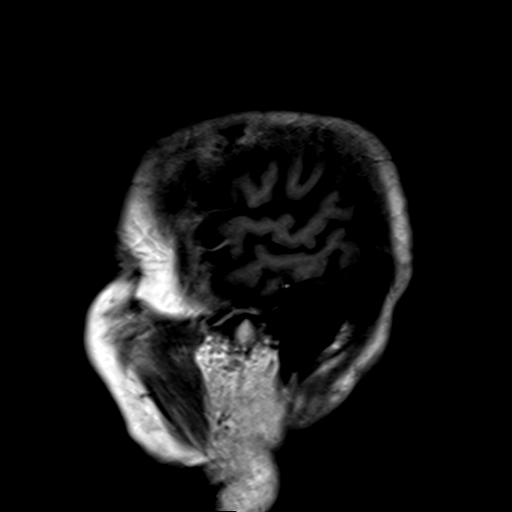
[im 12/23]
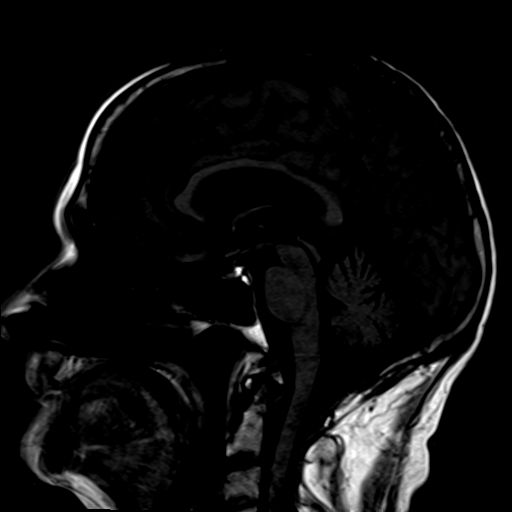
[im 23/23]
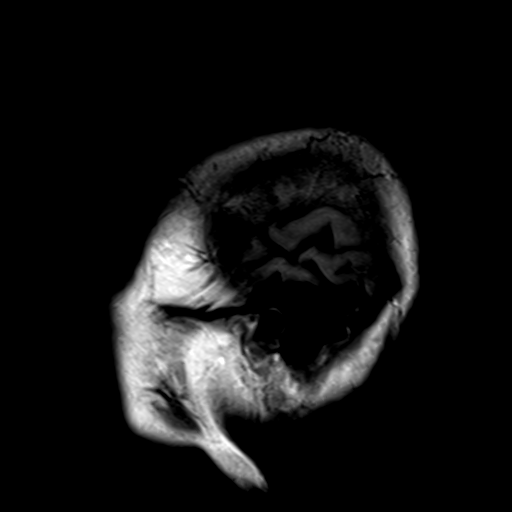

[Series 6: DWI · coronal · 3.0mm · 1.80mm/px · 4 of 45 slices shown (1 of 2)]
[im 1/45]
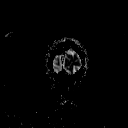
[im 15/45]
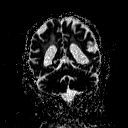
[im 30/45]
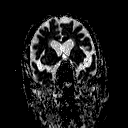
[im 45/45]
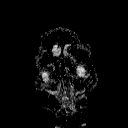

[Series 7: T2 · axial · 5.0mm · 0.60mm/px · z∈[-86,+69]mm · 2 of 25 slices shown (1 of 3)]
[im 1/25]
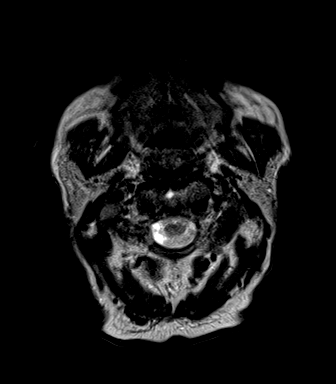
[im 25/25]
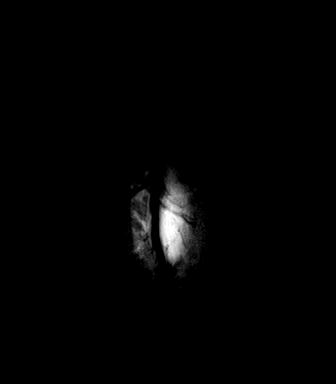

[Series 9: DWI · axial · 3.0mm · 1.80mm/px · z∈[-88,+73]mm · 5 of 55 slices shown (2 of 2)]
[im 1/55]
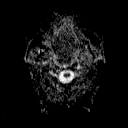
[im 14/55]
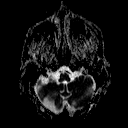
[im 28/55]
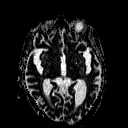
[im 41/55]
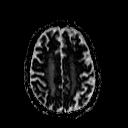
[im 55/55]
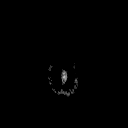

[Series 10: FLAIR · axial · 3.0mm · 0.45mm/px · z∈[-86,+69]mm · 5 of 53 slices shown]
[im 1/53]
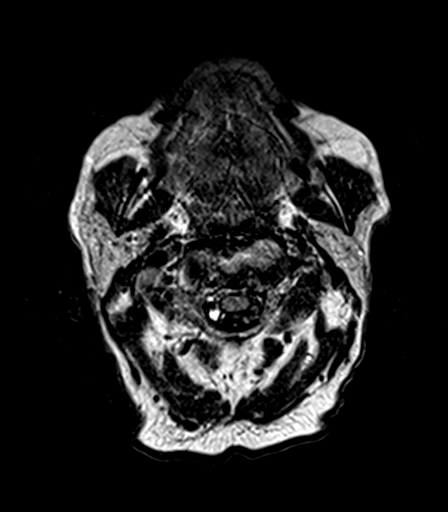
[im 14/53]
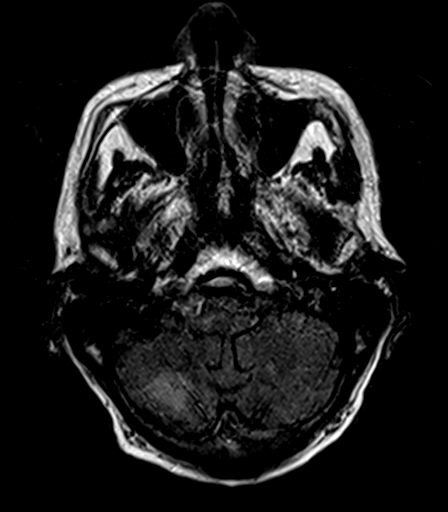
[im 27/53]
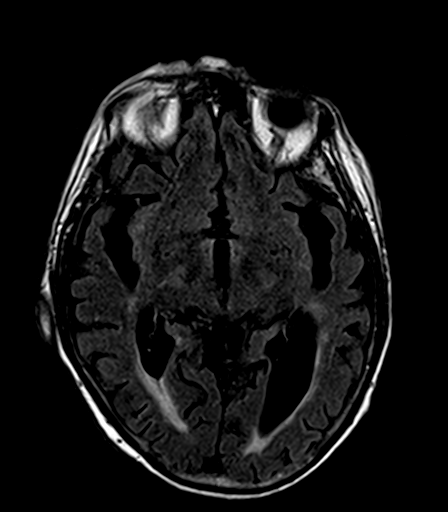
[im 40/53]
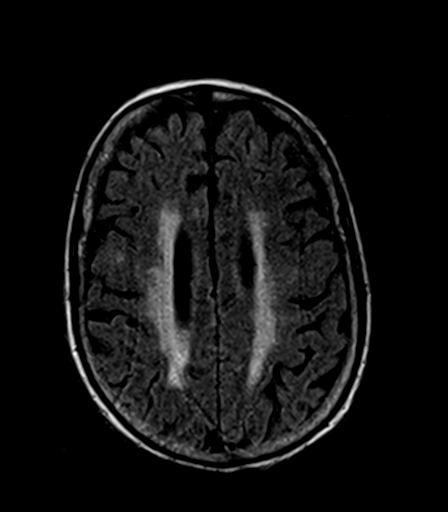
[im 53/53]
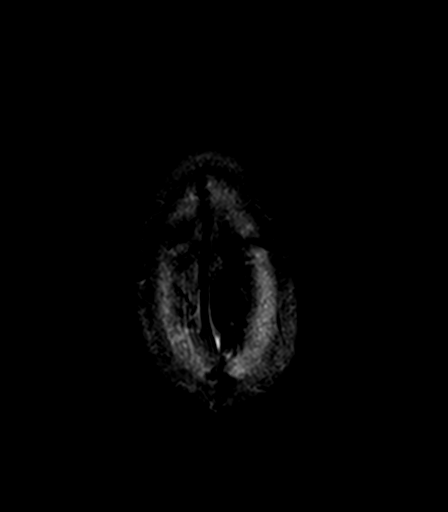

[Series 11: T1 · axial · 1.0mm · 1.00mm/px · z∈[-95,+80]mm · 16 of 176 slices shown (2 of 2)]
[im 1/176]
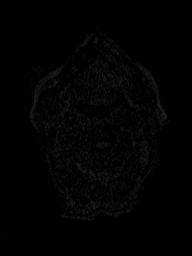
[im 12/176]
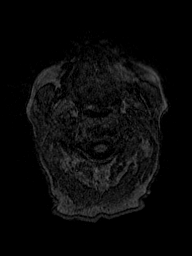
[im 24/176]
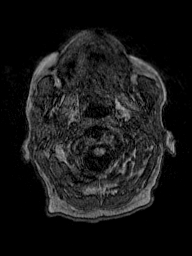
[im 36/176]
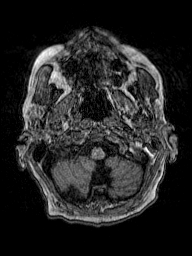
[im 47/176]
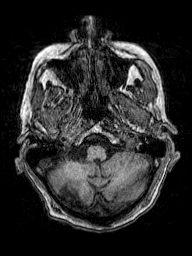
[im 59/176]
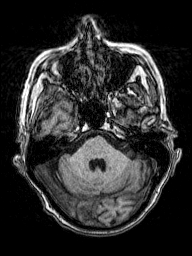
[im 71/176]
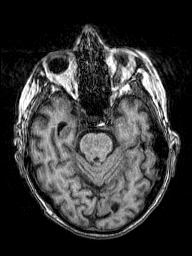
[im 82/176]
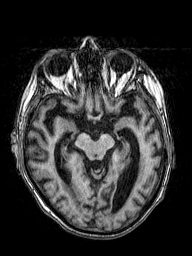
[im 94/176]
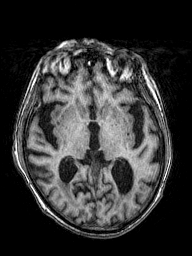
[im 106/176]
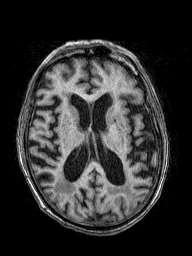
[im 117/176]
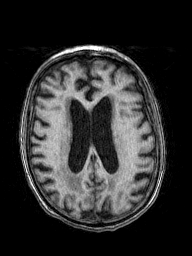
[im 129/176]
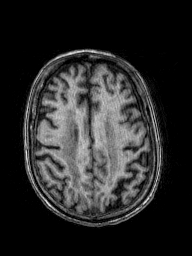
[im 141/176]
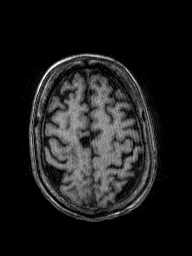
[im 152/176]
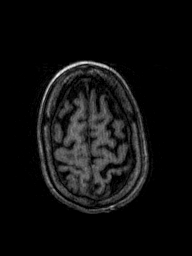
[im 164/176]
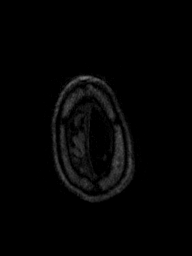
[im 176/176]
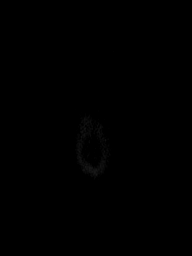

[Series 12: T2 · axial · 5.0mm · 0.45mm/px · z∈[-86,+69]mm · 2 of 25 slices shown (2 of 3)]
[im 1/25]
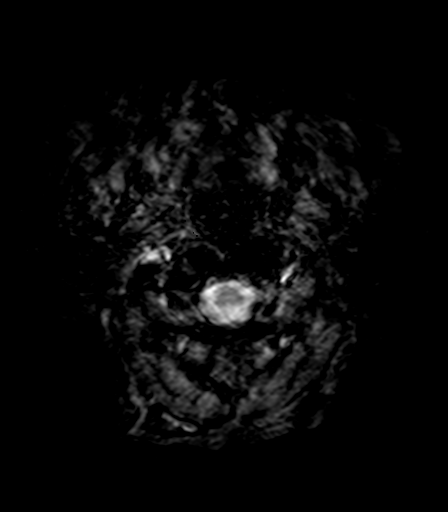
[im 25/25]
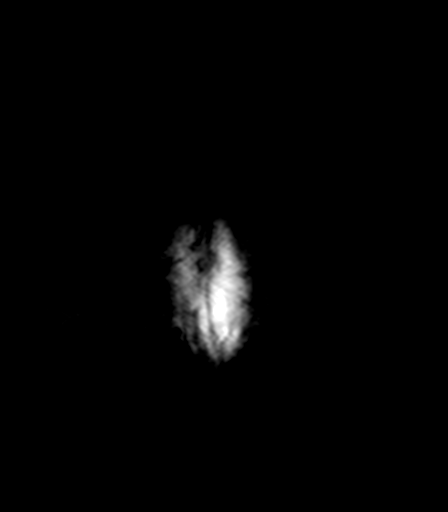

[Series 13: T2 · coronal · 5.0mm · 0.49mm/px · 2 of 27 slices shown (3 of 3)]
[im 1/27]
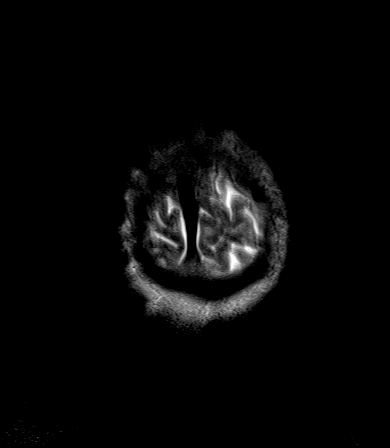
[im 27/27]
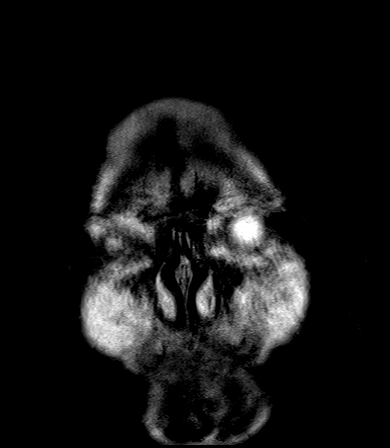

[Series 101: (id) cor · coronal · 3.0mm · 1.80mm/px · 4 of 45 slices shown]
[im 1/45]
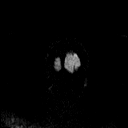
[im 15/45]
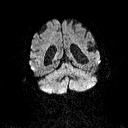
[im 30/45]
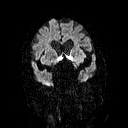
[im 45/45]
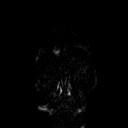

[Series 102: ax (id) · axial · 3.0mm · 1.80mm/px · z∈[-88,+73]mm · 5 of 55 slices shown]
[im 1/55]
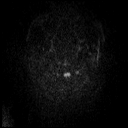
[im 14/55]
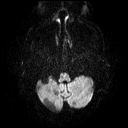
[im 28/55]
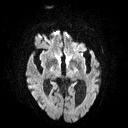
[im 41/55]
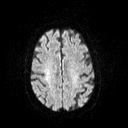
[im 55/55]
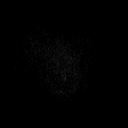

[48 of 48 positions shown; findings below may reference images not displayed]

FINDINGS: Brain: Mild generalized cerebral volume loss since 6693. No
restricted diffusion to suggest acute infarction. No midline shift,
mass effect, evidence of mass lesion, ventriculomegaly, extra-axial
collection or acute intracranial hemorrhage. Cervicomedullary
junction and pituitary are within normal limits. Chronic posterior
inferior right cerebellar infarct is unchanged since 6693. Patchy T2
heterogeneity in the pons is only mildly progressed since 6693. T2
heterogeneity in the bilateral deep gray matter nuclei -mostly the
basal ganglia- is mildly progressed. Similar mild progression of
chronic Patchy and confluent right greater than left cerebral white
matter T2 and FLAIR hyperintensity. No cerebral cortical
encephalomalacia or chronic cerebral blood products identified.

Vascular: Major intracranial vascular flow voids are preserved.

Skull and upper cervical spine: Negative. Normal bone marrow signal.

Sinuses/Orbits: Stable and negative.

Other: Visible internal auditory structures remain normal. Negative
scalp soft tissues.
IMPRESSION: 1.  No acute intracranial abnormality.
2. Chronic small and medium-sized vessel ischemia with mild
progression since [DATE].

## 2018-10-16 IMAGING — DX DG PELVIS 1-2V
1 series · 1 of 1 positions shown · non-contrast
Comparison: Pelvis CT dated 03/06/2017.

CLINICAL DATA: Pelvis pain today, greater on the right.

EXAM:
PELVIS - 1-2 VIEW

[pelvis ap]
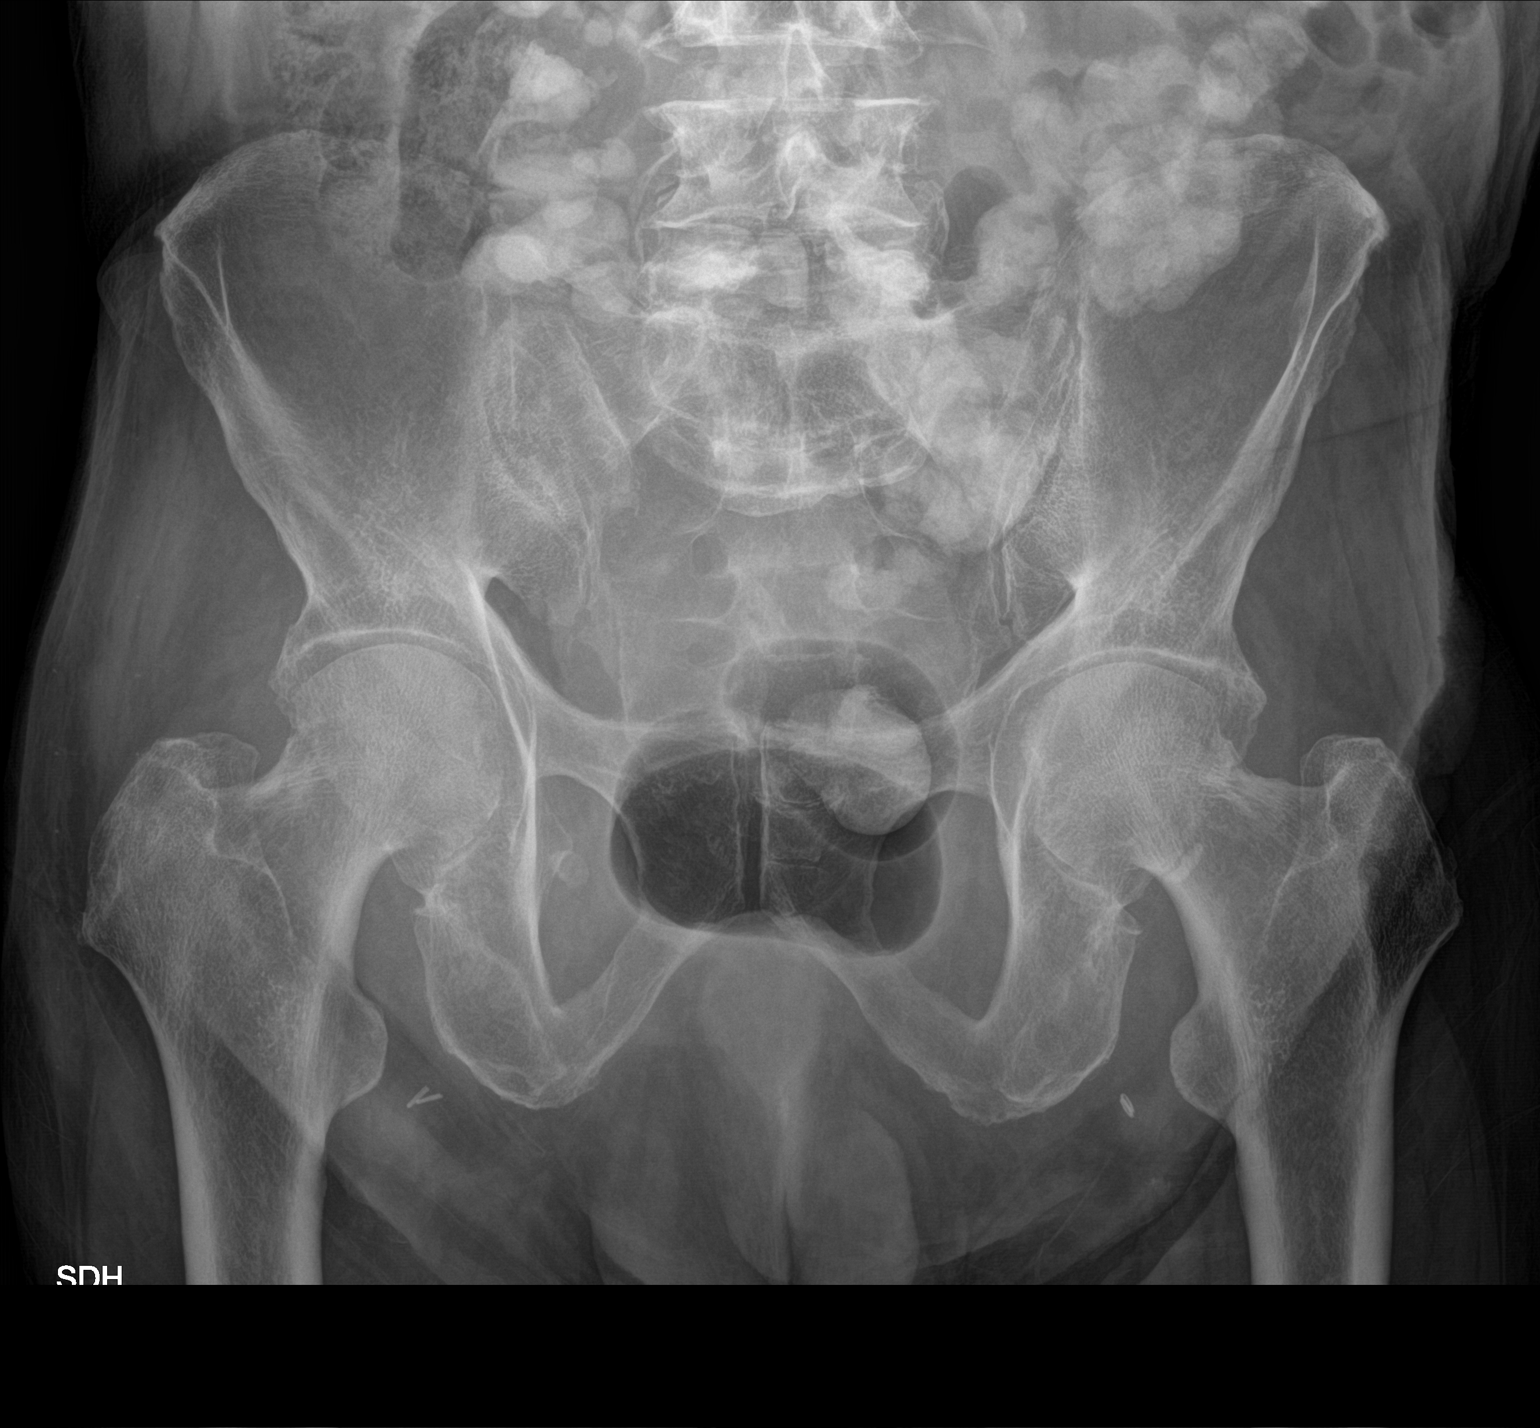

[1 of 1 positions shown; findings below may reference images not displayed]

FINDINGS: Bilateral groins surgical clips. Diffuse osteopenia. Mild bilateral
hip degenerative changes. Lower lumbar spine degenerative changes.
Ingested oral contrast in the bowel.
IMPRESSION: No acute abnormality.

## 2018-11-13 DIAGNOSIS — E871 Hypo-osmolality and hyponatremia: Secondary | ICD-10-CM | POA: Diagnosis not present

## 2018-11-13 DIAGNOSIS — D649 Anemia, unspecified: Secondary | ICD-10-CM | POA: Diagnosis not present

## 2018-11-24 DIAGNOSIS — E871 Hypo-osmolality and hyponatremia: Secondary | ICD-10-CM | POA: Diagnosis not present

## 2018-11-24 DIAGNOSIS — D649 Anemia, unspecified: Secondary | ICD-10-CM | POA: Diagnosis not present

## 2018-11-24 DIAGNOSIS — Z0000 Encounter for general adult medical examination without abnormal findings: Secondary | ICD-10-CM | POA: Diagnosis not present

## 2018-11-24 DIAGNOSIS — I639 Cerebral infarction, unspecified: Secondary | ICD-10-CM | POA: Diagnosis not present

## 2018-11-24 DIAGNOSIS — E782 Mixed hyperlipidemia: Secondary | ICD-10-CM | POA: Diagnosis not present

## 2018-11-25 ENCOUNTER — Other Ambulatory Visit: Payer: Self-pay

## 2018-11-25 ENCOUNTER — Encounter: Payer: Self-pay | Admitting: Emergency Medicine

## 2018-11-25 ENCOUNTER — Emergency Department: Payer: PPO

## 2018-11-25 ENCOUNTER — Emergency Department
Admission: EM | Admit: 2018-11-25 | Discharge: 2018-11-28 | Disposition: A | Discharge status: Skilled Nursing Facility | Payer: PPO | Attending: Emergency Medicine | Admitting: Emergency Medicine

## 2018-11-25 DIAGNOSIS — R531 Weakness: Secondary | ICD-10-CM | POA: Insufficient documentation

## 2018-11-25 DIAGNOSIS — Z96651 Presence of right artificial knee joint: Secondary | ICD-10-CM | POA: Insufficient documentation

## 2018-11-25 DIAGNOSIS — I2510 Atherosclerotic heart disease of native coronary artery without angina pectoris: Secondary | ICD-10-CM | POA: Insufficient documentation

## 2018-11-25 DIAGNOSIS — F0390 Unspecified dementia without behavioral disturbance: Secondary | ICD-10-CM | POA: Insufficient documentation

## 2018-11-25 DIAGNOSIS — R4182 Altered mental status, unspecified: Secondary | ICD-10-CM | POA: Diagnosis not present

## 2018-11-25 DIAGNOSIS — E871 Hypo-osmolality and hyponatremia: Secondary | ICD-10-CM | POA: Insufficient documentation

## 2018-11-25 DIAGNOSIS — Z951 Presence of aortocoronary bypass graft: Secondary | ICD-10-CM | POA: Insufficient documentation

## 2018-11-25 DIAGNOSIS — Z8673 Personal history of transient ischemic attack (TIA), and cerebral infarction without residual deficits: Secondary | ICD-10-CM | POA: Diagnosis not present

## 2018-11-25 DIAGNOSIS — Z79899 Other long term (current) drug therapy: Secondary | ICD-10-CM | POA: Insufficient documentation

## 2018-11-25 DIAGNOSIS — R404 Transient alteration of awareness: Secondary | ICD-10-CM | POA: Diagnosis not present

## 2018-11-25 DIAGNOSIS — M549 Dorsalgia, unspecified: Secondary | ICD-10-CM | POA: Diagnosis not present

## 2018-11-25 DIAGNOSIS — Z7982 Long term (current) use of aspirin: Secondary | ICD-10-CM | POA: Insufficient documentation

## 2018-11-25 DIAGNOSIS — R52 Pain, unspecified: Secondary | ICD-10-CM | POA: Diagnosis not present

## 2018-11-25 DIAGNOSIS — I10 Essential (primary) hypertension: Secondary | ICD-10-CM | POA: Diagnosis not present

## 2018-11-25 DIAGNOSIS — I252 Old myocardial infarction: Secondary | ICD-10-CM | POA: Diagnosis not present

## 2018-11-25 DIAGNOSIS — Z87891 Personal history of nicotine dependence: Secondary | ICD-10-CM | POA: Diagnosis not present

## 2018-11-25 DIAGNOSIS — G9389 Other specified disorders of brain: Secondary | ICD-10-CM | POA: Diagnosis not present

## 2018-11-25 DIAGNOSIS — M5489 Other dorsalgia: Secondary | ICD-10-CM | POA: Diagnosis not present

## 2018-11-25 HISTORY — DX: Unspecified dementia, unspecified severity, without behavioral disturbance, psychotic disturbance, mood disturbance, and anxiety: F03.90

## 2018-11-25 LAB — CBC
HEMATOCRIT: 30.8 % — AB (ref 39.0–52.0)
HEMOGLOBIN: 10.6 g/dL — AB (ref 13.0–17.0)
MCH: 31.9 pg (ref 26.0–34.0)
MCHC: 34.4 g/dL (ref 30.0–36.0)
MCV: 92.8 fL (ref 80.0–100.0)
PLATELETS: 243 10*3/uL (ref 150–400)
RBC: 3.32 MIL/uL — ABNORMAL LOW (ref 4.22–5.81)
RDW: 12.3 % (ref 11.5–15.5)
WBC: 4.8 10*3/uL (ref 4.0–10.5)
nRBC: 0 % (ref 0.0–0.2)

## 2018-11-25 LAB — URINALYSIS, COMPLETE (UACMP) WITH MICROSCOPIC
BILIRUBIN URINE: NEGATIVE
Bacteria, UA: NONE SEEN
GLUCOSE, UA: NEGATIVE mg/dL
HGB URINE DIPSTICK: NEGATIVE
Ketones, ur: NEGATIVE mg/dL
LEUKOCYTES UA: NEGATIVE
Nitrite: NEGATIVE
Protein, ur: NEGATIVE mg/dL
SPECIFIC GRAVITY, URINE: 1.006 (ref 1.005–1.030)
WBC, UA: NONE SEEN WBC/hpf (ref 0–5)
pH: 7 (ref 5.0–8.0)

## 2018-11-25 LAB — BASIC METABOLIC PANEL
ANION GAP: 8 (ref 5–15)
BUN: 23 mg/dL (ref 8–23)
CALCIUM: 9.3 mg/dL (ref 8.9–10.3)
CHLORIDE: 93 mmol/L — AB (ref 98–111)
CO2: 24 mmol/L (ref 22–32)
Creatinine, Ser: 1.16 mg/dL (ref 0.61–1.24)
GFR calc Af Amer: >60 mL/min (ref >60)
GFR, EST NON AFRICAN AMERICAN: 58 mL/min — AB (ref >60)
Glucose, Bld: 91 mg/dL (ref 70–99)
Potassium: 4.1 mmol/L (ref 3.5–5.1)
SODIUM: 125 mmol/L — AB (ref 135–145)

## 2018-11-25 LAB — TROPONIN I: Troponin I: <0.03 ng/mL (ref <0.03)

## 2018-11-25 MED ORDER — ASPIRIN EC 81 MG PO TBEC
81.0000 mg | DELAYED_RELEASE_TABLET | Freq: Every day | ORAL | Status: DC
  Filled 2018-11-25: qty 1

## 2018-11-25 MED ORDER — METOPROLOL TARTRATE 25 MG PO TABS
12.5000 mg | ORAL_TABLET | Freq: Two times a day (BID) | ORAL | Status: DC
  Administered 2018-11-25 – 2018-11-28 (×4): 12.5 mg via ORAL
  Filled 2018-11-25 (×6): qty 1

## 2018-11-25 MED ORDER — SIMVASTATIN 10 MG PO TABS
10.0000 mg | ORAL_TABLET | Freq: Every day | ORAL | Status: DC
  Administered 2018-11-25 – 2018-11-27 (×3): 10 mg via ORAL
  Filled 2018-11-25 (×3): qty 1

## 2018-11-25 MED ORDER — AMLODIPINE BESYLATE 5 MG PO TABS
5.0000 mg | ORAL_TABLET | Freq: Every day | ORAL | Status: DC
  Filled 2018-11-25: qty 1

## 2018-11-25 MED ORDER — DONEPEZIL HCL 5 MG PO TABS: 5.0000 mg | ORAL_TABLET | Freq: Every day | ORAL | Status: DC

## 2018-11-25 MED ORDER — PANTOPRAZOLE SODIUM 40 MG PO TBEC
40.0000 mg | DELAYED_RELEASE_TABLET | Freq: Every day | ORAL | Status: DC
  Filled 2018-11-25: qty 1

## 2018-11-25 NOTE — ED Triage Notes (Signed)
Pt to ED via [facility] from home for generalized weakness. Pt wife reports that pt is not able to get up and down as he normally does. Pt has hx/o dementia. Pt c/o back pain that radiates into his leg. Pt is in NAD at this time.

## 2018-11-25 NOTE — ED Provider Notes (Addendum)
[facility] Emergency Department Provider Note  Time seen: 11:11 AM  I have reviewed the triage vital signs and the nursing notes.   HISTORY  Chief Complaint Weakness    HPI NEVEN FICARA is a 85 y.o. male with a past medical history of arthritis, very mild dementia, gastric reflux, MI, presents to the emergency department for generalized weakness and increased confusion.  According to the son patient has very slight dementia but would normally be alert and oriented x3.  This morning patient seemed more confused and he was unable to get out of bed even with assistance.  Wife lives with the patient often helps him get in and out of bed assist with ambulation and daily activities.  States today the patient was too weak to get out of bed on his own and even with the wife's assistance could not get out of bed and seemed more confused.    Past Medical History:  Diagnosis Date  . Arthritis    (R)TKR 5/13 [facility]  . Coronary artery disease    2 stents  . Dementia (HCC)   . GERD (gastroesophageal reflux disease)   . Myocardial infarction (HCC)    NSTEMI 11/11/12 @ [facility]  . Peripheral vascular disease [facility])    (L)CEA 2007 [place]  . PONV (postoperative nausea and vomiting)   . Stroke [facility]) 2015    Patient Active Problem List   Diagnosis Date Noted  . Atherosclerosis of native arteries of extremity with intermittent claudication (HCC) 08/18/2017  . Lumbar and sacral osteoarthritis 08/18/2017  . GERD (gastroesophageal reflux disease) 04/11/2017  . Hyperlipidemia 03/30/2017  . Acute metabolic encephalopathy 03/08/2017  . SIADH (syndrome of inappropriate ADH production) (HCC) 03/08/2017  . Malnutrition (HCC) 03/08/2017  . Duodenal ulcer 03/08/2017  . History of esophagogastroduodenoscopy (EGD) 03/08/2017  . Leg pain 03/08/2017  . Urinary urgency 03/08/2017  . Dementia (HCC) 03/08/2017  . Hyponatremia 12/06/2016  . Cerebellar infarct (HCC) 03/26/2015  .  Inguinal hernia with history of incarceration-left 12/20/2012  . Coronary artery disease 11/21/2012  . Carotid artery disease (HCC) 11/21/2012  . S/P carotid endarterectomy 11/21/2012  . Stented coronary artery 11/21/2012  . S/P CABG x 4 11/21/2012  . Status post percutaneous transluminal coronary angioplasty 11/21/2012    Past Surgical History:  Procedure Laterality Date  . CARDIAC CATHETERIZATION     11/11/12 [facility],2000,2006 w/ stents  . CATARACT EXTRACTION    . CORONARY ARTERY BYPASS GRAFT  11/16/2012   Procedure: CORONARY ARTERY BYPASS GRAFTING (CABG);  Surgeon: Kerin Perna, MD;  Location: [facility] OR;  Service: Open Heart Surgery;  Laterality: N/A;  times four using Bilateral Greater Saphenous Vein Graft harvested endoscopically from bilateral thighs, and Left Internal Mammary Artery.  . ESOPHAGOGASTRODUODENOSCOPY (EGD) WITH PROPOFOL N/A 03/07/2017   Procedure: ESOPHAGOGASTRODUODENOSCOPY (EGD) WITH PROPOFOL;  Surgeon: Wyline Mood, MD;  Location: [facility] ENDOSCOPY;  Service: Endoscopy;  Laterality: N/A;  . ESOPHAGOGASTRODUODENOSCOPY (EGD) WITH PROPOFOL N/A 06/14/2017   Procedure: ESOPHAGOGASTRODUODENOSCOPY (EGD) WITH PROPOFOL;  Surgeon: Wyline Mood, MD;  Location: [facility] ENDOSCOPY;  Service: Endoscopy;  Laterality: N/A;  . HERNIA REPAIR  01/17/13   Dr Renda Rolls @ [facility]  . INTRAOPERATIVE TRANSESOPHAGEAL ECHOCARDIOGRAM  11/16/2012   Procedure: INTRAOPERATIVE TRANSESOPHAGEAL ECHOCARDIOGRAM;  Surgeon: Kerin Perna, MD;  Location: [facility] OR;  Service: Open Heart Surgery;  Laterality: N/A;  . JOINT REPLACEMENT     (R)TKR 5/13 [facility]  . VASCULAR SURGERY     (R)TKR 5/13 [facility]    Prior to  Admission medications   Medication Sig Start Date End Date Taking? Authorizing Provider  acetaminophen (TYLENOL) 650 MG CR tablet Take 650 mg 2 (two) times daily by mouth.    [provider]  amLODipine (NORVASC) 5 MG tablet Take 5 mg by mouth daily.  12/09/16   [provider]  aspirin EC 81 MG  tablet Take 1 tablet (81 mg total) daily by mouth. 09/28/17   Corky CraftsVaranasi, Jayadeep S, MD  Bee Pollen 1000 MG TABS Take 2,000 mg by mouth 2 (two) times daily.    [provider]  Cholecalciferol (VITAMIN D3) 1000 units CAPS Take 1,000 Units by mouth daily.    [provider]  docusate sodium (COLACE) 100 MG capsule Take by mouth.    [provider]  donepezil (ARICEPT) 5 MG tablet  05/31/17   [provider]  etodolac (LODINE) 400 MG tablet  02/09/17   [provider]  feeding supplement, ENSURE ENLIVE, (ENSURE ENLIVE) LIQD Take 237 mLs by mouth 3 (three) times daily between meals. 03/08/17   Katharina CaperVaickute, Rima, MD  ferrous sulfate 325 (65 FE) MG tablet Take by mouth.    [provider]  furosemide (LASIX) 20 MG tablet  08/05/17   [provider]  gabapentin (NEURONTIN) 100 MG capsule Take 100 mg by mouth at bedtime.    [provider]  lidocaine-prilocaine (EMLA) cream Apply topically as needed (pain). 03/08/17   Katharina CaperVaickute, Rima, MD  metoprolol tartrate (LOPRESSOR) 25 MG tablet Take 0.5 tablets (12.5 mg total) by mouth 2 (two) times daily. 11/21/12   Barrett, Erin R, PA-C  midodrine (PROAMATINE) 5 MG tablet  04/08/17   [provider]  pantoprazole (PROTONIX) 40 MG tablet Take 1 tablet (40 mg total) by mouth daily. 03/08/17   Katharina CaperVaickute, Rima, MD  senna-docusate (SENOKOT-S) 8.6-50 MG per tablet Take 1 tablet by mouth daily as needed for constipation.    [provider]  simvastatin (ZOCOR) 20 MG tablet Take 10 mg by mouth at bedtime.    [provider]  SODIUM CHLORIDE PO Take by mouth.    [provider]  sucralfate (CARAFATE) 1 g tablet  03/29/17   [provider]  vitamin C (ASCORBIC ACID) 500 MG tablet Take 500 mg by mouth daily.    [provider]    Allergies  Allergen Reactions  . Ace Inhibitors Other (See Comments)  . Cyclobenzaprine Other (See Comments)    Alters mental status,  hypersomnience Alters mental status, hypersomnience  . Percocet [Oxycodone-Acetaminophen]     Hallucinations   . Septra [Sulfamethoxazole-Trimethoprim]   . Sulfamethoxazole-Trimethoprim Other (See Comments)  . Tramadol Other (See Comments)  . Tramadol Hcl Other (See Comments)    Hallucinations    Family History  Problem Relation Age of Onset  . Hypertension Father     Social History Social History   Tobacco Use  . Smoking status: Former Smoker    Types: Cigars    Last attempt to quit: 10/22/1989    Years since quitting: 29.1  . Smokeless tobacco: Never Used  Substance Use Topics  . Alcohol use: No  . Drug use: No    Review of Systems Unable to obtain an adequate/accurate review of systems secondary to confusion/altered mental status.  ____________________________________________   PHYSICAL EXAM:  VITAL SIGNS: ED Triage Vitals  Enc Vitals Group     BP 11/25/18 1053 (!) 156/69     Pulse Rate 11/25/18 1053 63     Resp 11/25/18 1053 16  Temp 11/25/18 1053 97.7 F (36.5 C)     Temp Source 11/25/18 1053 Oral     SpO2 11/25/18 1053 100 %     Weight 11/25/18 1055 161 lb (73 kg)     Height 11/25/18 1055 5\' 9"  (1.753 m)     Head Circumference --      [facility] Flow --      Pain Score --      Pain Loc --      Pain Edu? --      Excl. in GC? --    Constitutional: Alert and oriented. Well appearing and in no distress. Eyes: Normal exam ENT   Head: Normocephalic and atraumatic.   Mouth/Throat: Mucous membranes are moist. Cardiovascular: Normal rate, regular rhythm.  Respiratory: Normal respiratory effort without tachypnea nor retractions. Breath sounds are clear  Gastrointestinal: Soft and nontender. No distention.   Musculoskeletal: Nontender with normal range of motion in all extremities.  Neurologic:  Normal speech and language. No gross focal neurologic deficits  Skin:  Skin is warm, dry and intact.  Psychiatric: Mood and affect are normal.    ____________________________________________    EKG  EKG viewed and interpreted by myself shows a normal sinus rhythm at 61 bpm with a slightly widened QRS, normal axis, normal intervals besides slight PR prolongation nonspecific ST changes.  ____________________________________________    RADIOLOGY  CT scan of the head shows progression chronic microvascular ischemia, possible NPH  ____________________________________________   INITIAL IMPRESSION / ASSESSMENT AND PLAN / ED COURSE  Pertinent labs & imaging results that were available during my care of the patient were reviewed by me and considered in my medical decision making (see chart for details).  Patient presents to the emergency department for generalized weakness and increased confusion.  Differential would include infectious etiology such as urinary tract infection, electrolyte abnormality, CVA.  Reassuringly patient has great grip strength bilaterally no obvious signs of CVA currently.  We will check labs, CT scan of the head and urinalysis  CT scan of the head shows progression of chronic changes as well as possible normal pressure hydrocephalus.  However family is describing more of an acute change over the past 24 to 48 hours.  Wife is now here she describes more of a chronic decline.  They state they have spoken to the primary care doctor, were thinking of involving hospice due to the patient's progressive symptoms.  From an acute medical standpoint the patient's work-up is largely nonrevealing he is hyponatremic however this is largely unchanged from his baseline.  His urinalysis is finally resulted but is within normal limits.  Given the patient's reported recent decline in function I offered admission to the hospital.  Wife is very concerned that this could be an observation admission instead of an inpatient admission.  I told her I would not know for sure at this time but currently it would look more like an admission  for observation, unless something were to show during the patient's continued work-up.  They strongly do not want the patient admitted to the hospital if it is going to be under observation.  I also offered to have social work and physical therapy evaluate the patient, they are agreeable to this plan of care and believe this could be a good compromise.  We will order a hospital bed for the patient.  I will order the patient's home medications.  We will keep the patient in the emergency department until PT and social work eval possibly today  most likely tomorrow.  ____________________________________________   FINAL CLINICAL IMPRESSION(S) / ED DIAGNOSES  Weakness   Minna AntisPaduchowski, Money Mckeithan, MD 11/25/18 1503    Minna AntisPaduchowski, Elese Rane, MD 11/25/18 1505

## 2018-11-25 NOTE — Clinical Social Work Note (Signed)
Clinical Social Work Assessment  Patient Details  Name: Bobby [name] MRN: [id number] Date of Birth: 08-Jul-1932  Date of referral:  (P) 11/25/18               Reason for consult:  (P) Other (Comment Required)(SNF)                Permission sought to share information with:  (P) Family Supports(Joyce Colden, spause, [phone] / Cari Voshell, son, [phone] / Rodney Booze, daughter in [place] [phone]) Permission granted to share information::  (P) Yes, Verbal Permission Granted(Teresa, Loraine Leriche, and Ward Chatters)  Name::     (P) (Mrs. Ruy Dunkelberger, Colletta Maryland, and Ward Chatters)  Agency::     Relationship::  (P) (Mrs. Tip Panzica, Colletta Maryland, and Ward Chatters)  Contact Information:  (P) (Mrs. Jarrah Desser, Colletta Maryland, and Ward Chatters)  Housing/Transportation Living arrangements for the past 2 months:  (P) Single Family Home(Lives with wife) Source of Information:  (P) (Wife, family members) Patient Interpreter Needed:  (P) None Criminal Activity/Legal Involvement Pertinent to Current Situation/Hospitalization:  (P) No - Comment as needed Significant Relationships:  (P) Adult Children, Spouse Lives with:  (P) Domestic Partner(Joyce Brett, Wife) Do you feel safe going back to the place where you live?  (P) Yes Need for family participation in patient care:  (P) Yes (Comment)  Care giving concerns:  Short term rehab   Social Worker assessment / plan:  The patient is a 85 y.o., male. Lives with his wife in their home.  [name].  Family report that for the past two days the patient has been having difficulty getting out of bed, walking, feeding and dressing himself. Family stated that the patient "stop responding" and had difficulty recognizing them. Family noted that "he seemed out of it." Ambulates with a wheelchair and needs help with transfers. Incontinent of bladder & bowel at times. Wears a diaper. No issues are noted with regards to  chewing or swallowing. Appetite is fair.  Patient oriented to self. No behavioral health issues noted or reported. Family reported that the patient had a stay at [facility] in 2018.  Family requesting not be referred to [facility]. Clinician explained that information is going to be sent out to various facilities for bed availability. Family noted that the patient has Managed medicare, [employer] and also has GTL supplemental for short rehab stay.   Due to his weakness and limited physical abilities, SNF is recommended.   Employment status:  (P) Retired Health and safety inspector:  (P) Managed Medicare([employer]) PT Recommendations:    Information / Referral to community resources:     Patient/Family's Response to care:  Excellent understanding of patient's needs.   Patient/Family's Understanding of and Emotional Response to Diagnosis, Current Treatment, and Prognosis:  Family understands SNF process  Emotional Assessment Appearance:  (P) Appears stated age Attitude/Demeanor/Rapport:  (P) Other(calm) Affect (typically observed):  (P) Accepting, Stable Orientation:  (P) Oriented to Self, Oriented to Place, Oriented to  Time, Oriented to Situation Alcohol / Substance use:  (P) Not Applicable Psych involvement (Current and /or in the community):  (P) No (Comment)  Discharge Needs  Concerns to be addressed:  (P) Cognitive Concerns(dementia) Readmission within the last 30 days:  (P) No Current discharge risk:  (P) Physical Impairment(abulates with a wheelchair, frail difficulty walking) Barriers to Discharge:  (P) No Barriers Identified   Deshonda Cryderman I Tenisha Fleece, LCSW 11/25/2018, 5:38 PM

## 2018-11-25 NOTE — ED Notes (Signed)
Family at bedside. Patient taken to CT scan. Patient's son states it would be difficult for the patient to realize he needs to urinate. MD aware.

## 2018-11-25 NOTE — ED Notes (Signed)
Request put in for a hospital bed.

## 2018-11-25 NOTE — Progress Notes (Signed)
The patient is a 85 y.o., male. Lives with his wife in their home.  [name].  Family report that for the past two days the patient has been having difficulty getting out of bed, walking, feeding and dressing himself. Family stated that the patient "stop responding" and had difficulty recognizing them. Family noted that "he seemed out of it." Ambulates with a wheelchair and needs help with transfers. Incontinent of bladder & bowel at times. Wears a diaper. No issues are noted with regards to chewing or swallowing. Appetite is fair.  Patient oriented to self. No behavioral health issues noted or reported. Family reported that the patient had a stay at [facility] in 2018.  Family requesting not be referred to [facility]. Clinician explained that information is going to be sent out to various facilities for bed availability. Family noted that the patient has Managed medicare, [employer] and also has GTL supplemental for short rehab stay.   Due to his weakness and limited physical abilities, SNF is recommended.   Received verbal permission to sent information via the HUB.  FL2, completed/PASSR. Waiting for bed offers from facilities.   Larwance Rote, MSW, LCSW  [phone]

## 2018-11-25 NOTE — [place] FL2 (Signed)
[place] MEDICAID FL2 LEVEL OF CARE SCREENING TOOL     IDENTIFICATION  Patient Name: Bobby [name] Birthdate: 25-Sep-1932 Sex: male Admission Date (Current Location): 11/25/2018  [place] and [place] Number:  Chiropodist and Address:  [facility], [address] [place], [place] [address]      Provider Number: [id number]  Attending Physician Name and Address:  Minna Antis, MD  Relative Name and Phone Number:  Nyquan Pursifull    Current Level of Care: Home Recommended Level of Care: Skilled Nursing Facility Prior Approval Number:    Date Approved/Denied:   PASRR Number: [id number]  Discharge Plan: SNF    Current Diagnoses: Patient Active Problem List   Diagnosis Date Noted  . Atherosclerosis of native arteries of extremity with intermittent claudication (HCC) 08/18/2017  . Lumbar and sacral osteoarthritis 08/18/2017  . GERD (gastroesophageal reflux disease) 04/11/2017  . Hyperlipidemia 03/30/2017  . Acute metabolic encephalopathy 03/08/2017  . SIADH (syndrome of inappropriate ADH production) (HCC) 03/08/2017  . Malnutrition (HCC) 03/08/2017  . Duodenal ulcer 03/08/2017  . History of esophagogastroduodenoscopy (EGD) 03/08/2017  . Leg pain 03/08/2017  . Urinary urgency 03/08/2017  . Dementia (HCC) 03/08/2017  . Hyponatremia 12/06/2016  . Cerebellar infarct (HCC) 03/26/2015  . Inguinal hernia with history of incarceration-left 12/20/2012  . Coronary artery disease 11/21/2012  . Carotid artery disease (HCC) 11/21/2012  . S/P carotid endarterectomy 11/21/2012  . Stented coronary artery 11/21/2012  . S/P CABG x 4 11/21/2012  . Status post percutaneous transluminal coronary angioplasty 11/21/2012    Orientation RESPIRATION BLADDER Height & Weight     Self  Normal(Room air) Incontinent Weight: 161 lb (73 kg) Height:  5\' 9"  (175.3 cm)  BEHAVIORAL SYMPTOMS/MOOD NEUROLOGICAL BOWEL NUTRITION STATUS      Incontinent (Heart  [place])  AMBULATORY STATUS COMMUNICATION OF NEEDS Skin   Extensive Assist   Normal                       Personal Care Assistance Level of Assistance  Total care       Total Care Assistance: Limited assistance   Functional Limitations Info             SPECIAL CARE FACTORS FREQUENCY  (Ambulates with a wheelchair, dementia)                    Contractures Contractures Info: Not present    Additional Factors Info  Code Status, Allergies Code Status Info: Full code Allergies Info: Ace Inhibitors, Cyclobenzaprine, Percocet Oxycodone-acetaminophen, Septra Sulfamethoxazole-trimethoprim, Sulfamethoxazole-trimethoprim, Tramadol, Tramadol Hcl           Current Medications (11/25/2018):  This is the current hospital active medication list Current Facility-Administered Medications  Medication Dose Route Frequency Provider Last Rate Last Dose  . amLODipine (NORVASC) tablet 5 mg  5 mg Oral Daily Minna Antis, MD      . aspirin EC tablet 81 mg  81 mg Oral Daily Minna Antis, MD      . donepezil (ARICEPT) tablet 5 mg  5 mg Oral QHS Minna Antis, MD      . metoprolol tartrate (LOPRESSOR) tablet 12.5 mg  12.5 mg Oral BID Minna Antis, MD      . pantoprazole (PROTONIX) EC tablet 40 mg  40 mg Oral Daily Minna Antis, MD      . simvastatin (ZOCOR) tablet 10 mg  10 mg Oral QHS Minna Antis, MD       Current Outpatient  Medications  Medication Sig Dispense Refill  . acetaminophen (TYLENOL) 650 MG CR tablet Take 650 mg 2 (two) times daily by mouth.    Marland Kitchen amLODipine (NORVASC) 5 MG tablet Take 5 mg by mouth daily.     Marland Kitchen aspirin EC 81 MG tablet Take 1 tablet (81 mg total) daily by mouth. 90 tablet 3  . Bee Pollen 1000 MG TABS Take 2,000 mg by mouth 2 (two) times daily.    . Cholecalciferol (VITAMIN D3) 1000 units CAPS Take 1,000 Units by mouth daily.    Marland Kitchen docusate sodium (COLACE) 100 MG capsule Take by mouth.    . donepezil (ARICEPT) 5 MG tablet      . etodolac (LODINE) 400 MG tablet     . feeding supplement, ENSURE ENLIVE, (ENSURE ENLIVE) LIQD Take 237 mLs by mouth 3 (three) times daily between meals. 90 Bottle 12  . ferrous sulfate 325 (65 FE) MG tablet Take by mouth.    . furosemide (LASIX) 20 MG tablet     . gabapentin (NEURONTIN) 100 MG capsule Take 100 mg by mouth at bedtime.    . lidocaine-prilocaine (EMLA) cream Apply topically as needed (pain). 30 g 0  . metoprolol tartrate (LOPRESSOR) 25 MG tablet Take 0.5 tablets (12.5 mg total) by mouth 2 (two) times daily. 30 tablet 1  . midodrine (PROAMATINE) 5 MG tablet     . pantoprazole (PROTONIX) 40 MG tablet Take 1 tablet (40 mg total) by mouth daily. 30 tablet 3  . senna-docusate (SENOKOT-S) 8.6-50 MG per tablet Take 1 tablet by mouth daily as needed for constipation.    . simvastatin (ZOCOR) 20 MG tablet Take 10 mg by mouth at bedtime.    . SODIUM CHLORIDE PO Take by mouth.    . sucralfate (CARAFATE) 1 g tablet     . vitamin C (ASCORBIC ACID) 500 MG tablet Take 500 mg by mouth daily.       Discharge Medications: Please see discharge summary for a list of discharge medications.  Relevant Imaging Results:  Relevant Lab Results:   Additional Information SSN: [id number]  Larwance Rote, LCSW

## 2018-11-25 NOTE — ED Notes (Signed)
Patient given ED sandwich tray and cranberry juice, and water.

## 2018-11-25 NOTE — ED Notes (Signed)
Patient was repositioned on stretcher. Patient tolerated it well. Per wife's report, patient ate the grilled cheese sandwich and some grapes and drank the cranberry juice. Patient is alert, oriented to self, verbal and appropriate at this time. Patient requires assistance to reposition self on the stretcher.

## 2018-11-25 NOTE — ED Notes (Signed)
Report to Barnabas Harries RN

## 2018-11-25 NOTE — ED Notes (Signed)
Son is staying with patient.

## 2018-11-25 NOTE — ED Notes (Signed)
Patient assisted to a side-lying position to use the urinal. Patient's brief is soaked. Family at bedside.

## 2018-11-25 NOTE — ED Notes (Signed)
Family is at bedside. NAD.

## 2018-11-25 NOTE — ED Notes (Signed)
Pt resting quietly in bed, lights out; son at bedside in recliner and to stay the night; pt with no complaints; call bell in reach for son, says father is fidgeting and will accidentally hit the button; sats 99% on room air; pt denies pain; encouraged son to call for any needs; will continue to monitor through the night;

## 2018-11-25 NOTE — ED Notes (Signed)
Patient is pleasant, follows commands and notices who comes in and out of the room. Patient's wife is helping the patient eat an tray sent from dietary. Patient could not draw up blanket for self and needs help repositioning self on the stretcher.

## 2018-11-25 NOTE — ED Notes (Addendum)
Pt had episode of incontinence. Pt cleaned up by this tech and Geraldine Contras, Charity fundraiser. Pts brief changed and moved into hospital bed for comfort. Family at bedside.

## 2018-11-26 NOTE — ED Notes (Signed)
Pt cleaned up of urine incontinence. Pt resting comfortably. Ate all his lunch without the bun.

## 2018-11-26 NOTE — ED Notes (Signed)
Family asked if they could give pt his sodium tablet and preservision vitamin. Yes per dr.

## 2018-11-26 NOTE — ED Notes (Signed)
PT continues to rest quietly on ER stretcher with eyes closed.  Awaiting Social work consult, will continue to monitor.

## 2018-11-26 NOTE — ED Notes (Signed)
Report received, care of pt assumed.  Pt resting quietly on ER stretcher, NAD noted no c/o or needs voiced at this time, will continue to monitor.

## 2018-11-26 NOTE — ED Notes (Signed)
Pt resting with eyes closed, resp even and unlabored 

## 2018-11-26 NOTE — ED Notes (Signed)
Report received - pt is a boarder awaiting nh placement that will most likely take place tomorrow. Pt cleaned up of urine (incontinent and in brief).

## 2018-11-26 NOTE — ED Notes (Signed)
Pt changed after voiding, son is in the room. Pt was asleep. Nothing needed by pt or family at this time.

## 2018-11-26 NOTE — ED Notes (Signed)
Pt and son awake; son says father was "messing in the [employer]" and had removed all of his blankets; incontinent of urine; cleaned and changed; repositioned; given water to drink

## 2018-11-26 NOTE — ED Notes (Signed)
Son using call bell; ED tech Alyssa in, pt incontinent of urine; cleaned and dry attends in place;

## 2018-11-26 NOTE — ED Provider Notes (Signed)
-----------------------------------------   6:51 AM on 11/26/2018 -----------------------------------------   Blood pressure (!) 150/98, pulse (!) 58, temperature 97.7 F (36.5 C), temperature source Oral, resp. rate 17, height 5\' 9"  (1.753 m), weight 73 kg, SpO2 97 %.  The patient had no acute events since last update.  Disposition is pending clinical social work team recommendations.     Irean Hong, MD 11/26/18 [phone]

## 2018-11-26 NOTE — Progress Notes (Signed)
LCSW received a call from [employer] and they are still awaiting authorization number. Verlon Au spoke to patient daughter and understands the transition process. If authorization comes in today patient can transport by EMS.   [employer] LCSW [phone]

## 2018-11-26 NOTE — ED Notes (Signed)
Pt resting quietly with eyes closed, resp even and unlabored; son asleep in recliner; will continue to monitor

## 2018-11-26 NOTE — ED Notes (Signed)
Patient given coffee at this time.

## 2018-11-27 MED ORDER — POLYETHYLENE GLYCOL 3350 17 G PO PACK
17.0000 g | PACK | Freq: Every day | ORAL | Status: DC
  Administered 2018-11-27 – 2018-11-28 (×2): 17 g via ORAL
  Filled 2018-11-27 (×2): qty 1

## 2018-11-27 NOTE — ED Notes (Signed)
Pt turned, dried, repositioned - daughter at bedside

## 2018-11-27 NOTE — Progress Notes (Signed)
New referral for outpatient Palliative to follow at [facility] received from CSW [employer]. Plan is for discharge when insurance authorization is in place. Patient information faxed to referral.  Dayna Barker RN, BSN, [facility] [place], hospital liaison

## 2018-11-27 NOTE — ED Notes (Signed)
Pt given breakfast tray and coffee - spouse at bedside and assisting pt

## 2018-11-27 NOTE — ED Notes (Signed)
Patient's daughter states that upon patient's arrival to ED, they were given the impression that patient would have a consult with palliative care. No notes or orders seen in chart. MD aware and order for consult placed. Family member made aware.

## 2018-11-27 NOTE — ED Notes (Signed)
Pt family requested laxative for pt. Pt has not had a bowel movement since Friday the day before arrival. MD Roxan Hockey put order in for lax.   LM EDT

## 2018-11-27 NOTE — ED Notes (Signed)
PT here to work with pt

## 2018-11-27 NOTE — ED Provider Notes (Signed)
-----------------------------------------   6:24 AM on 11/27/2018 -----------------------------------------   Blood pressure (!) 152/82, pulse 65, temperature 97.7 F (36.5 C), temperature source Oral, resp. rate 18, height 5\' 9"  (1.753 m), weight 73 kg, SpO2 96 %.  The patient had no acute events since last update.  Resting at this time.  Spouse in room.  Disposition is pending social work recommendations.     Irean Hong, MD 11/27/18 [phone]

## 2018-11-27 NOTE — ED Notes (Signed)
Patient taken lunch tray. Daughter at bedside with patient assisting him to eat. Patient sitting up in bed. Alert and oriented at baseline

## 2018-11-27 NOTE — Clinical Social Work Note (Signed)
Per Verlon Au at [employer], they have not obtained authorization for patient yet. She states that it is still pending. CSW will continue to follow for discharge planning.   Ruthe Mannan MSW, [address]  [phone]

## 2018-11-27 NOTE — Evaluation (Signed)
Physical Therapy Evaluation Patient Details Name: Bobby CroonHarvey L Greiner MRN: [id number] DOB: [birth year] Today's Date: 11/27/2018   History of Present Illness  Per MD note: Pt is an 85 y.o. male with a past medical history of arthritis, very mild dementia, gastric reflux, MI, presented to the emergency department for generalized weakness and increased confusion.  According to the son patient has very slight dementia but would normally be alert and oriented x3.  The patient seemed more confused and he was unable to get out of bed even with assistance.  Wife lives with the patient often helps him get in and out of bed assist with ambulation and daily activities.  Stated the patient was too weak to get out of bed on his own and even with the wife's assistance could not get out of bed and seemed more confused.      Clinical Impression  Pt presents with deficits in strength, transfers, mobility, gait, balance, and activity tolerance.  Pt required extensive assistance with bed mobility tasks and min A to stand from an elevated EOB.  Upon standing pt had difficulty standing fully upright with bilateral knees remaining slightly flexed.  Pt was able to take several very small steps at the EOB before requiring to return to sitting and needed min A for stability.  Overall pt presents with a significant decline in functional mobility compared to his baseline and will benefit from PT services in a SNF setting upon discharge to safely address above deficits for decreased caregiver assistance and eventual return to PLOF.       Follow Up Recommendations SNF;Supervision/Assistance - 24 hour    Equipment Recommendations  None recommended by PT    Recommendations for Other Services       Precautions / Restrictions Precautions Precautions: Fall Restrictions Weight Bearing Restrictions: No      Mobility  Bed Mobility Overal bed mobility: Needs Assistance Bed Mobility: Sit to Supine;Supine to Sit     Supine to  sit: Mod assist Sit to supine: Mod assist   General bed mobility comments: Mod A for BLEs in and out of bed and for trunk to full upright position  Transfers Overall transfer level: Needs assistance Equipment used: Rolling walker (2 wheeled) Transfers: Sit to/from Stand Sit to Stand: Min assist         General transfer comment: Max verbal and tactile cues for sequencing   Ambulation/Gait Ambulation/Gait assistance: Min assist Gait Distance (Feet): 1 Feet Assistive device: Rolling walker (2 wheeled) Gait Pattern/deviations: Step-to pattern;Shuffle;Trunk flexed Gait velocity: Decreased   General Gait Details: Pt able to take 2-3 small, shuffling steps at the EOB with cues for sequencing  Stairs            Wheelchair Mobility    Modified Rankin (Stroke Patients Only)       Balance Overall balance assessment: Mild deficits observed, not formally tested                                           Pertinent Vitals/Pain Pain Assessment: No/denies pain    Home Living Family/patient expects to be discharged to:: Private residence(History from daughter-in-law) Living Arrangements: Spouse/significant other Available Help at Discharge: Family;Available 24 hours/day Type of Home: House Home Access: Stairs to enter Entrance Stairs-Rails: Right;Left(Too wide for both) Entrance Stairs-Number of Steps: 4 Home Layout: One level Home Equipment: Walker - 2 wheels;Walker - standard;Transport  chair      Prior Function Level of Independence: Needs assistance   Gait / Transfers Assistance Needed: Pt able to amb limited community distances with a RW, no fall history, occasional min A with bed mobility  ADL's / Homemaking Assistance Needed: Spouse assists with cooking, ADLs, cleaning        Hand Dominance        Extremity/Trunk Assessment   Upper Extremity Assessment Upper Extremity Assessment: Generalized weakness    Lower Extremity  Assessment Lower Extremity Assessment: Generalized weakness       Communication      Cognition Arousal/Alertness: Awake/alert Behavior During Therapy: WFL for tasks assessed/performed Overall Cognitive Status: Impaired/Different from baseline Area of Impairment: Orientation;Attention                                      General Comments      Exercises Total Joint Exercises Ankle Circles/Pumps: AROM;Both;10 reps Quad Sets: Strengthening;Both;10 reps Heel Slides: AROM;AAROM;Both;5 reps Hip ABduction/ADduction: AROM;AAROM;Both;10 reps Straight Leg Raises: AROM;AAROM;Both;10 reps Long Arc Quad: AROM;Both;10 reps Knee Flexion: AROM;Both;10 reps Marching in Standing: AROM;Both;10 reps;Seated   Assessment/Plan    PT Assessment Patient needs continued PT services  PT Problem List Decreased strength;Decreased activity tolerance;Decreased balance;Decreased mobility;Decreased knowledge of use of DME       PT Treatment Interventions DME instruction;Gait training;Stair training;Functional mobility training;Therapeutic activities;Therapeutic exercise;Balance training;Patient/family education    PT Goals (Current goals can be found in the Care Plan section)  Acute Rehab PT Goals Patient Stated Goal: To get stronger  PT Goal Formulation: With patient Time For Goal Achievement: 12/10/18 Potential to Achieve Goals: Good    Frequency Min 2X/week   Barriers to discharge Inaccessible home environment      Co-evaluation               AM-PAC PT "6 Clicks" Mobility  Outcome Measure Help needed turning from your back to your side while in a flat bed without using bedrails?: A Lot Help needed moving from lying on your back to sitting on the side of a flat bed without using bedrails?: A Lot Help needed moving to and from a bed to a chair (including a wheelchair)?: A Lot Help needed standing up from a chair using your arms (e.g., wheelchair or bedside chair)?: A  Little Help needed to walk in hospital room?: Total Help needed climbing 3-5 steps with a railing? : Total 6 Click Score: 11    End of Session Equipment Utilized During Treatment: Gait belt Activity Tolerance: Patient tolerated treatment well Patient left: in bed;with call bell/phone within reach;with bed alarm set;with family/visitor present Nurse Communication: Mobility status PT Visit Diagnosis: Unsteadiness on feet (R26.81);Muscle weakness (generalized) (M62.81);Difficulty in walking, not elsewhere classified (R26.2)    Time: 6811-5726 PT Time Calculation (min) (ACUTE ONLY): 31 min   Charges:   PT Evaluation $PT Eval Low Complexity: 1 Low PT Treatments $Therapeutic Exercise: 8-22 mins        D. Scott Akela Pocius PT, DPT 11/27/18, 1:27 PM

## 2018-11-28 DIAGNOSIS — Z8673 Personal history of transient ischemic attack (TIA), and cerebral infarction without residual deficits: Secondary | ICD-10-CM | POA: Diagnosis not present

## 2018-11-28 DIAGNOSIS — E559 Vitamin D deficiency, unspecified: Secondary | ICD-10-CM | POA: Diagnosis not present

## 2018-11-28 DIAGNOSIS — M5489 Other dorsalgia: Secondary | ICD-10-CM | POA: Diagnosis not present

## 2018-11-28 DIAGNOSIS — M48062 Spinal stenosis, lumbar region with neurogenic claudication: Secondary | ICD-10-CM | POA: Diagnosis not present

## 2018-11-28 DIAGNOSIS — K219 Gastro-esophageal reflux disease without esophagitis: Secondary | ICD-10-CM | POA: Diagnosis not present

## 2018-11-28 DIAGNOSIS — I252 Old myocardial infarction: Secondary | ICD-10-CM | POA: Diagnosis not present

## 2018-11-28 DIAGNOSIS — R531 Weakness: Secondary | ICD-10-CM | POA: Diagnosis not present

## 2018-11-28 DIAGNOSIS — R1311 Dysphagia, oral phase: Secondary | ICD-10-CM | POA: Diagnosis not present

## 2018-11-28 DIAGNOSIS — I2510 Atherosclerotic heart disease of native coronary artery without angina pectoris: Secondary | ICD-10-CM | POA: Diagnosis not present

## 2018-11-28 DIAGNOSIS — I10 Essential (primary) hypertension: Secondary | ICD-10-CM | POA: Diagnosis not present

## 2018-11-28 DIAGNOSIS — I739 Peripheral vascular disease, unspecified: Secondary | ICD-10-CM | POA: Diagnosis not present

## 2018-11-28 DIAGNOSIS — Z9989 Dependence on other enabling machines and devices: Secondary | ICD-10-CM | POA: Diagnosis not present

## 2018-11-28 DIAGNOSIS — M47897 Other spondylosis, lumbosacral region: Secondary | ICD-10-CM | POA: Diagnosis not present

## 2018-11-28 DIAGNOSIS — Z7401 Bed confinement status: Secondary | ICD-10-CM | POA: Diagnosis not present

## 2018-11-28 DIAGNOSIS — E871 Hypo-osmolality and hyponatremia: Secondary | ICD-10-CM | POA: Diagnosis not present

## 2018-11-28 DIAGNOSIS — Z955 Presence of coronary angioplasty implant and graft: Secondary | ICD-10-CM | POA: Diagnosis not present

## 2018-11-28 DIAGNOSIS — E785 Hyperlipidemia, unspecified: Secondary | ICD-10-CM | POA: Diagnosis not present

## 2018-11-28 DIAGNOSIS — F0390 Unspecified dementia without behavioral disturbance: Secondary | ICD-10-CM | POA: Diagnosis not present

## 2018-11-28 DIAGNOSIS — Z951 Presence of aortocoronary bypass graft: Secondary | ICD-10-CM | POA: Diagnosis not present

## 2018-11-28 DIAGNOSIS — I70219 Atherosclerosis of native arteries of extremities with intermittent claudication, unspecified extremity: Secondary | ICD-10-CM | POA: Diagnosis not present

## 2018-11-28 DIAGNOSIS — M2550 Pain in unspecified joint: Secondary | ICD-10-CM | POA: Diagnosis not present

## 2018-11-28 DIAGNOSIS — R52 Pain, unspecified: Secondary | ICD-10-CM | POA: Diagnosis not present

## 2018-11-28 DIAGNOSIS — M6281 Muscle weakness (generalized): Secondary | ICD-10-CM | POA: Diagnosis not present

## 2018-11-28 LAB — BASIC METABOLIC PANEL
Anion gap: 6 (ref 5–15)
BUN: 26 mg/dL — ABNORMAL HIGH (ref 8–23)
CALCIUM: 9.2 mg/dL (ref 8.9–10.3)
CO2: 23 mmol/L (ref 22–32)
Chloride: 98 mmol/L (ref 98–111)
Creatinine, Ser: 1.2 mg/dL (ref 0.61–1.24)
GFR calc Af Amer: >60 mL/min (ref >60)
GFR calc non Af Amer: 55 mL/min — ABNORMAL LOW (ref >60)
Glucose, Bld: 91 mg/dL (ref 70–99)
Potassium: 4.7 mmol/L (ref 3.5–5.1)
Sodium: 127 mmol/L — ABNORMAL LOW (ref 135–145)

## 2018-11-28 NOTE — ED Notes (Signed)
Pts brief changed  

## 2018-11-28 NOTE — ED Notes (Signed)
Transport here for patient. Patient incontinent of stool. Large bowel movement soft and brown noted. Peri care provided. Patient on the way to [facility]

## 2018-11-28 NOTE — Clinical Social Work Note (Signed)
Patient will be discharged to [facility] today. CSW notified patient's daughter in law Bobby [name] [phone] of discharge. CSW also notified Verlon Au at [employer]. Verlon Au states that patient will go to room 405 at [facility] and report number is [phone]. CSW also notified patient's nurse of above. Patient will be transported by EMS. RN will call for transport and call report.   Ruthe Mannan MSW, [address] [phone]

## 2018-11-28 NOTE — ED Notes (Signed)
[facility]  CALLED  FOR  TRANSPORT 

## 2018-11-28 NOTE — Clinical Social Work Note (Signed)
CSW notified by Artist Pais with [employer] that patient has been approved for 7 days at [facility] and Berkley Syre number is [phone]. CSW notified Verlon Au at [employer] of authorization information.   Ruthe Mannan MSW, [address] [phone]

## 2018-11-28 NOTE — ED Provider Notes (Signed)
-----------------------------------------   12:06 PM on 11/28/2018 ----------------------------------------- Vital signs remained stable.  Chemistry panel rechecked today due to issues of chronic hyponatremia, sodium of 127 is slightly improved from previous.  No acute concerns, awaiting placement.   Sharman Cheek, MD 11/28/18 1207

## 2018-11-28 NOTE — ED Notes (Signed)
Assumed care of patient, patient reports pain to right leg that gets better with repositioning. As per family at bedside. Patient awating transfer to [facility]. Vss. Safety maintained. Call light within reach. Will monitor.

## 2018-11-28 NOTE — ED Notes (Signed)
Family concerned about pts sodium level as it is chronically low. Will address with Dr Scotty Court.

## 2018-11-28 NOTE — ED Notes (Signed)
Pulled up in bed, repositioned, daughter at bedside, pt with no complaints. NAD.

## 2018-11-28 NOTE — ED Notes (Signed)
Pt resting in bed, no complaints, family at bedside.

## 2018-11-28 NOTE — ED Notes (Signed)
Report called to [employer] spoke with receiving nurse, Burnett Harry Rn. Transport called no eta at present time. Will call receiving nurse with ETA. Family to follow.

## 2018-11-28 NOTE — ED Notes (Signed)
ED TO INPATIENT HANDOFF REPORT  Name/Age/Gender Bobby [name] 85 y.o. male  Code Status Code Status History    Date Active Date Inactive Code Status Order ID Comments User Context   03/04/2017 1226 03/12/2017 0013 DNR [id number]  Shaune Pollackhen, Qing, MD Inpatient   12/06/2016 1621 12/09/2016 1745 Full Code [id number]  Auburn BilberryPatel, Shreyang, MD Inpatient   11/14/2012 1737 11/16/2012 1356 Full Code [id number]  Ardelle BallsZimmerman, Donielle M, PA Inpatient    Questions for Most Recent Historical Code Status (Order [id number])    Question Answer Comment   In the event of cardiac or respiratory ARREST Do not call a "code blue"    In the event of cardiac or respiratory ARREST Do not perform Intubation, CPR, defibrillation or ACLS    In the event of cardiac or respiratory ARREST Use medication by any route, position, wound care, and other measures to relive pain and suffering. May use oxygen, suction and manual treatment of airway obstruction as needed for comfort.       Home/SNF/Other From home  Chief Complaint weaknes  Level of Care/Admitting Diagnosis ED Disposition    None      Medical History Past Medical History:  Diagnosis Date  . Arthritis    (R)TKR 5/13 [facility]  . Coronary artery disease    2 stents  . Dementia (HCC)   . GERD (gastroesophageal reflux disease)   . Myocardial infarction (HCC)    NSTEMI 11/11/12 @ [facility]  . Peripheral vascular disease [facility](HCC)    (L)CEA 2007 [place]  . PONV (postoperative nausea and vomiting)   . Stroke [facility](HCC) 2015    Allergies Allergies  Allergen Reactions  . Aricept [Donepezil Hcl]     patient was combative  . Tramadol Hcl Other (See Comments)    Hallucinations  . Cyclobenzaprine Other (See Comments)    Alters mental status, hypersomnience Alters mental status, hypersomnience  . Percocet [Oxycodone-Acetaminophen]     Hallucinations   . Septra [Sulfamethoxazole-Trimethoprim]   . Sulfamethoxazole-Trimethoprim Other (See Comments)  . Tramadol Other (See  Comments)  . Ace Inhibitors Other (See Comments)    IV Location/Drains/Wounds Patient Lines/Drains/Airways Status   Active Line/Drains/Airways    Name:   Placement date:   Placement time:   Site:   Days:   Peripheral IV 11/25/18 Right Antecubital   11/25/18    1058    Antecubital   3   Airway   06/14/17    1024     532          Labs/Imaging Results for orders placed or performed during the hospital encounter of 11/25/18 (from the past 48 hour(s))  Basic metabolic panel     Status: Abnormal   Collection Time: 11/28/18 10:33 AM  Result Value Ref Range   Sodium 127 (L) 135 - 145 mmol/L   Potassium 4.7 3.5 - 5.1 mmol/L   Chloride 98 98 - 111 mmol/L   CO2 23 22 - 32 mmol/L   Glucose, Bld 91 70 - 99 mg/dL   BUN 26 (H) 8 - 23 mg/dL   Creatinine, Ser 1.021.20 0.61 - 1.24 mg/dL   Calcium 9.2 8.9 - 72.510.3 mg/dL   GFR calc non Af Amer 55 (L) >60 mL/min   GFR calc Af Amer >60 >60 mL/min   Anion gap 6 5 - 15    Comment: Performed at [facility], [address] [place], [place] [address]   No results found.  Pending Labs [employer] Labs (From admission, onward)  None      Vitals/Pain Today's Vitals   11/28/18 0638 11/28/18 0639 11/28/18 0851 11/28/18 1213  BP:  (!) 144/77 (!) 148/62 (!) 141/67  Pulse: 64  65 (!) 57  Resp:   16 17  Temp:  97.8 F (36.6 C)  98.7 F (37.1 C)  TempSrc:  Oral  Oral  SpO2: 97%  96% 99%  Weight:      Height:      PainSc:    0-No pain    Isolation Precautions No active isolations  Medications Medications  metoprolol tartrate (LOPRESSOR) tablet 12.5 mg (12.5 mg Oral Given 11/28/18 0908)  simvastatin (ZOCOR) tablet 10 mg (10 mg Oral Given 11/27/18 2036)  polyethylene glycol (MIRALAX / GLYCOLAX) packet 17 g (17 g Oral Given 11/28/18 0908)    Mobility ambulatory

## 2018-11-29 DIAGNOSIS — I739 Peripheral vascular disease, unspecified: Secondary | ICD-10-CM | POA: Diagnosis not present

## 2018-11-29 DIAGNOSIS — E871 Hypo-osmolality and hyponatremia: Secondary | ICD-10-CM | POA: Diagnosis not present

## 2018-11-29 DIAGNOSIS — M48062 Spinal stenosis, lumbar region with neurogenic claudication: Secondary | ICD-10-CM | POA: Diagnosis not present

## 2018-11-29 DIAGNOSIS — Z9989 Dependence on other enabling machines and devices: Secondary | ICD-10-CM | POA: Diagnosis not present

## 2018-11-29 DIAGNOSIS — F0390 Unspecified dementia without behavioral disturbance: Secondary | ICD-10-CM | POA: Diagnosis not present

## 2018-12-04 DIAGNOSIS — E871 Hypo-osmolality and hyponatremia: Secondary | ICD-10-CM | POA: Diagnosis not present

## 2018-12-04 DIAGNOSIS — F0390 Unspecified dementia without behavioral disturbance: Secondary | ICD-10-CM | POA: Diagnosis not present

## 2018-12-25 DIAGNOSIS — Z951 Presence of aortocoronary bypass graft: Secondary | ICD-10-CM | POA: Diagnosis not present

## 2018-12-25 DIAGNOSIS — I252 Old myocardial infarction: Secondary | ICD-10-CM | POA: Diagnosis not present

## 2018-12-25 DIAGNOSIS — F0150 Vascular dementia without behavioral disturbance: Secondary | ICD-10-CM | POA: Diagnosis not present

## 2018-12-25 DIAGNOSIS — I739 Peripheral vascular disease, unspecified: Secondary | ICD-10-CM | POA: Diagnosis not present

## 2018-12-25 DIAGNOSIS — I10 Essential (primary) hypertension: Secondary | ICD-10-CM | POA: Diagnosis not present

## 2018-12-25 DIAGNOSIS — E785 Hyperlipidemia, unspecified: Secondary | ICD-10-CM | POA: Diagnosis not present

## 2018-12-25 DIAGNOSIS — I2510 Atherosclerotic heart disease of native coronary artery without angina pectoris: Secondary | ICD-10-CM | POA: Diagnosis not present

## 2018-12-25 DIAGNOSIS — E871 Hypo-osmolality and hyponatremia: Secondary | ICD-10-CM | POA: Diagnosis not present

## 2019-05-09 DIAGNOSIS — K4090 Unilateral inguinal hernia, without obstruction or gangrene, not specified as recurrent: Secondary | ICD-10-CM | POA: Diagnosis not present

## 2019-05-09 DIAGNOSIS — I2510 Atherosclerotic heart disease of native coronary artery without angina pectoris: Secondary | ICD-10-CM | POA: Diagnosis not present

## 2019-05-21 DIAGNOSIS — E782 Mixed hyperlipidemia: Secondary | ICD-10-CM | POA: Diagnosis not present

## 2019-05-28 DIAGNOSIS — I739 Peripheral vascular disease, unspecified: Secondary | ICD-10-CM | POA: Diagnosis not present

## 2019-05-28 DIAGNOSIS — M48062 Spinal stenosis, lumbar region with neurogenic claudication: Secondary | ICD-10-CM | POA: Diagnosis not present

## 2019-05-28 DIAGNOSIS — Z0000 Encounter for general adult medical examination without abnormal findings: Secondary | ICD-10-CM | POA: Diagnosis not present

## 2019-05-28 DIAGNOSIS — F0280 Dementia in other diseases classified elsewhere without behavioral disturbance: Secondary | ICD-10-CM | POA: Diagnosis not present

## 2019-05-28 DIAGNOSIS — G301 Alzheimer's disease with late onset: Secondary | ICD-10-CM | POA: Diagnosis not present

## 2019-05-28 DIAGNOSIS — E440 Moderate protein-calorie malnutrition: Secondary | ICD-10-CM | POA: Diagnosis not present

## 2019-05-28 DIAGNOSIS — E871 Hypo-osmolality and hyponatremia: Secondary | ICD-10-CM | POA: Diagnosis not present

## 2019-06-27 DIAGNOSIS — I2510 Atherosclerotic heart disease of native coronary artery without angina pectoris: Secondary | ICD-10-CM | POA: Diagnosis not present

## 2019-06-27 DIAGNOSIS — E871 Hypo-osmolality and hyponatremia: Secondary | ICD-10-CM | POA: Diagnosis not present

## 2019-06-27 DIAGNOSIS — R32 Unspecified urinary incontinence: Secondary | ICD-10-CM | POA: Diagnosis not present

## 2019-06-27 DIAGNOSIS — I739 Peripheral vascular disease, unspecified: Secondary | ICD-10-CM | POA: Diagnosis not present

## 2019-06-27 DIAGNOSIS — I10 Essential (primary) hypertension: Secondary | ICD-10-CM | POA: Diagnosis not present

## 2019-06-27 DIAGNOSIS — F0150 Vascular dementia without behavioral disturbance: Secondary | ICD-10-CM | POA: Diagnosis not present

## 2019-06-27 DIAGNOSIS — I252 Old myocardial infarction: Secondary | ICD-10-CM | POA: Diagnosis not present

## 2019-06-27 DIAGNOSIS — E785 Hyperlipidemia, unspecified: Secondary | ICD-10-CM | POA: Diagnosis not present

## 2019-08-15 ENCOUNTER — Other Ambulatory Visit: Payer: Self-pay

## 2019-08-15 DIAGNOSIS — Z20822 Contact with and (suspected) exposure to covid-19: Secondary | ICD-10-CM

## 2019-08-16 LAB — NOVEL CORONAVIRUS, NAA: SARS-CoV-2, NAA: NOT DETECTED

## 2019-08-28 ENCOUNTER — Other Ambulatory Visit: Payer: Self-pay

## 2019-08-28 DIAGNOSIS — Z20828 Contact with and (suspected) exposure to other viral communicable diseases: Secondary | ICD-10-CM | POA: Diagnosis not present

## 2019-08-28 DIAGNOSIS — Z20822 Contact with and (suspected) exposure to covid-19: Secondary | ICD-10-CM

## 2019-08-30 LAB — NOVEL CORONAVIRUS, NAA: SARS-CoV-2, NAA: NOT DETECTED

## 2019-09-21 DIAGNOSIS — R531 Weakness: Secondary | ICD-10-CM | POA: Diagnosis not present

## 2019-09-21 DIAGNOSIS — M2550 Pain in unspecified joint: Secondary | ICD-10-CM | POA: Diagnosis not present

## 2019-09-21 DIAGNOSIS — Z7401 Bed confinement status: Secondary | ICD-10-CM | POA: Diagnosis not present

## 2019-09-21 DIAGNOSIS — R5381 Other malaise: Secondary | ICD-10-CM | POA: Diagnosis not present

## 2019-09-21 DIAGNOSIS — W19XXXA Unspecified fall, initial encounter: Secondary | ICD-10-CM | POA: Diagnosis not present

## 2019-10-23 DEATH — deceased
